# Patient Record
Sex: Female | Born: 1955 | Race: White | Hispanic: No | Smoking: Current every day smoker
Health system: Southern US, Community
[De-identification: ages and names within clinical notes are randomized; demographics above are authoritative.]

## PROBLEM LIST (undated history)

## (undated) DIAGNOSIS — C801 Malignant (primary) neoplasm, unspecified: Secondary | ICD-10-CM

## (undated) DIAGNOSIS — I509 Heart failure, unspecified: Secondary | ICD-10-CM

## (undated) DIAGNOSIS — E1149 Type 2 diabetes mellitus with other diabetic neurological complication: Secondary | ICD-10-CM

## (undated) DIAGNOSIS — M1990 Unspecified osteoarthritis, unspecified site: Secondary | ICD-10-CM

## (undated) DIAGNOSIS — I10 Essential (primary) hypertension: Secondary | ICD-10-CM

## (undated) DIAGNOSIS — G629 Polyneuropathy, unspecified: Secondary | ICD-10-CM

## (undated) DIAGNOSIS — I739 Peripheral vascular disease, unspecified: Secondary | ICD-10-CM

## (undated) DIAGNOSIS — IMO0001 Reserved for inherently not codable concepts without codable children: Secondary | ICD-10-CM

## (undated) DIAGNOSIS — J449 Chronic obstructive pulmonary disease, unspecified: Secondary | ICD-10-CM

## (undated) HISTORY — PX: KNEE ARTHROSCOPY: SUR90

## (undated) HISTORY — PX: TONSILLECTOMY: SUR1361

## (undated) HISTORY — PX: CORONARY ANGIOPLASTY: SHX604

## (undated) HISTORY — PX: BACK SURGERY: SHX140

## (undated) HISTORY — DX: Peripheral vascular disease, unspecified: I73.9

## (undated) HISTORY — PX: CARPAL TUNNEL RELEASE: SHX101

## (undated) HISTORY — DX: Essential (primary) hypertension: I10

## (undated) HISTORY — DX: Unspecified osteoarthritis, unspecified site: M19.90

## (undated) HISTORY — DX: Chronic obstructive pulmonary disease, unspecified: J44.9

## (undated) HISTORY — PX: FOOT SURGERY: SHX648

## (undated) HISTORY — PX: ABDOMINAL HYSTERECTOMY: SHX81

---

## 1998-09-17 ENCOUNTER — Encounter: Admission: RE | Admit: 1998-09-17 | Discharge: 1998-11-09 | Payer: Self-pay | Admitting: Anesthesiology

## 2001-05-10 ENCOUNTER — Encounter: Payer: Self-pay | Admitting: Neurosurgery

## 2001-05-10 ENCOUNTER — Inpatient Hospital Stay (HOSPITAL_COMMUNITY): Admission: RE | Admit: 2001-05-10 | Discharge: 2001-05-13 | Payer: Self-pay | Admitting: Neurosurgery

## 2002-07-05 ENCOUNTER — Encounter: Payer: Self-pay | Admitting: Neurosurgery

## 2002-07-05 ENCOUNTER — Ambulatory Visit (HOSPITAL_COMMUNITY): Admission: RE | Admit: 2002-07-05 | Discharge: 2002-07-05 | Payer: Self-pay | Admitting: Neurosurgery

## 2002-10-21 ENCOUNTER — Encounter: Payer: Self-pay | Admitting: Neurosurgery

## 2002-10-28 ENCOUNTER — Inpatient Hospital Stay (HOSPITAL_COMMUNITY): Admission: RE | Admit: 2002-10-28 | Discharge: 2002-11-01 | Payer: Self-pay | Admitting: Neurosurgery

## 2002-10-28 ENCOUNTER — Encounter: Payer: Self-pay | Admitting: Neurosurgery

## 2003-03-21 ENCOUNTER — Other Ambulatory Visit: Payer: Self-pay

## 2003-11-16 ENCOUNTER — Ambulatory Visit (HOSPITAL_COMMUNITY): Admission: RE | Admit: 2003-11-16 | Discharge: 2003-11-16 | Payer: Self-pay | Admitting: Neurosurgery

## 2004-01-15 ENCOUNTER — Ambulatory Visit: Payer: Self-pay | Admitting: Specialist

## 2004-01-15 ENCOUNTER — Other Ambulatory Visit: Payer: Self-pay

## 2004-01-20 ENCOUNTER — Other Ambulatory Visit: Payer: Self-pay

## 2004-01-20 ENCOUNTER — Inpatient Hospital Stay: Payer: Self-pay | Admitting: Specialist

## 2006-09-05 ENCOUNTER — Ambulatory Visit: Payer: Self-pay | Admitting: Internal Medicine

## 2006-09-07 ENCOUNTER — Ambulatory Visit: Payer: Self-pay | Admitting: Gastroenterology

## 2006-12-05 ENCOUNTER — Inpatient Hospital Stay: Payer: Self-pay | Admitting: Internal Medicine

## 2006-12-05 ENCOUNTER — Other Ambulatory Visit: Payer: Self-pay

## 2006-12-08 ENCOUNTER — Other Ambulatory Visit: Payer: Self-pay

## 2007-01-03 ENCOUNTER — Ambulatory Visit: Payer: Self-pay | Admitting: Vascular Surgery

## 2007-01-12 ENCOUNTER — Emergency Department: Payer: Self-pay | Admitting: Unknown Physician Specialty

## 2007-01-12 ENCOUNTER — Other Ambulatory Visit: Payer: Self-pay

## 2007-01-23 ENCOUNTER — Ambulatory Visit: Payer: Self-pay | Admitting: Unknown Physician Specialty

## 2007-01-25 ENCOUNTER — Ambulatory Visit: Payer: Self-pay | Admitting: Unknown Physician Specialty

## 2007-10-09 ENCOUNTER — Ambulatory Visit: Payer: Self-pay | Admitting: Vascular Surgery

## 2007-12-19 ENCOUNTER — Other Ambulatory Visit: Payer: Self-pay

## 2007-12-19 ENCOUNTER — Ambulatory Visit: Payer: Self-pay | Admitting: Internal Medicine

## 2007-12-28 ENCOUNTER — Ambulatory Visit: Payer: Self-pay | Admitting: Cardiovascular Disease

## 2008-11-12 ENCOUNTER — Ambulatory Visit: Payer: Self-pay | Admitting: Gastroenterology

## 2010-05-14 ENCOUNTER — Inpatient Hospital Stay: Payer: Self-pay | Admitting: Internal Medicine

## 2010-05-21 LAB — PATHOLOGY REPORT

## 2010-11-28 ENCOUNTER — Emergency Department: Payer: Self-pay | Admitting: *Deleted

## 2011-02-22 ENCOUNTER — Ambulatory Visit: Payer: Self-pay | Admitting: Internal Medicine

## 2011-07-05 ENCOUNTER — Observation Stay: Payer: Self-pay | Admitting: Internal Medicine

## 2011-07-05 LAB — CK TOTAL AND CKMB (NOT AT ARMC)
CK-MB: 0.8 ng/mL (ref 0.5–3.6)
CK-MB: 4.9 ng/mL — ABNORMAL HIGH (ref 0.5–3.6)

## 2011-07-05 LAB — CBC
HGB: 15.6 g/dL (ref 12.0–16.0)
MCV: 93 fL (ref 80–100)
Platelet: 282 10*3/uL (ref 150–440)

## 2011-07-05 LAB — TROPONIN I
Troponin-I: <0.02 ng/mL
Troponin-I: <0.02 ng/mL

## 2011-07-05 LAB — COMPREHENSIVE METABOLIC PANEL
Alkaline Phosphatase: 174 U/L — ABNORMAL HIGH (ref 50–136)
Anion Gap: 10 (ref 7–16)
Bilirubin,Total: 0.3 mg/dL (ref 0.2–1.0)
Chloride: 100 mmol/L (ref 98–107)
Co2: 25 mmol/L (ref 21–32)
Creatinine: 0.69 mg/dL (ref 0.60–1.30)
EGFR (African American): >60
EGFR (Non-African Amer.): >60
Glucose: 229 mg/dL — ABNORMAL HIGH (ref 65–99)
Osmolality: 280 (ref 275–301)
SGOT(AST): 79 U/L — ABNORMAL HIGH (ref 15–37)
SGPT (ALT): 63 U/L
Sodium: 135 mmol/L — ABNORMAL LOW (ref 136–145)
Total Protein: 9 g/dL — ABNORMAL HIGH (ref 6.4–8.2)

## 2011-07-06 LAB — HEPATIC FUNCTION PANEL A (ARMC)
Alkaline Phosphatase: 163 U/L — ABNORMAL HIGH (ref 50–136)
Bilirubin, Direct: <0.1 mg/dL (ref 0.00–0.20)
Bilirubin,Total: 0.3 mg/dL (ref 0.2–1.0)
SGOT(AST): 87 U/L — ABNORMAL HIGH (ref 15–37)
SGPT (ALT): 67 U/L
Total Protein: 8.2 g/dL (ref 6.4–8.2)

## 2011-07-06 LAB — BASIC METABOLIC PANEL
BUN: 23 mg/dL — ABNORMAL HIGH (ref 7–18)
Calcium, Total: 9.3 mg/dL (ref 8.5–10.1)
Chloride: 98 mmol/L (ref 98–107)
Co2: 22 mmol/L (ref 21–32)
Creatinine: 0.86 mg/dL (ref 0.60–1.30)
EGFR (Non-African Amer.): >60
Osmolality: 281 (ref 275–301)
Potassium: 4.7 mmol/L (ref 3.5–5.1)
Sodium: 132 mmol/L — ABNORMAL LOW (ref 136–145)

## 2011-07-06 LAB — CBC WITH DIFFERENTIAL/PLATELET
Basophil #: 0 10*3/uL (ref 0.0–0.1)
Basophil %: 0 %
Eosinophil #: 0 10*3/uL (ref 0.0–0.7)
HCT: 41 % (ref 35.0–47.0)
HGB: 13.9 g/dL (ref 12.0–16.0)
Lymphocyte #: 0.9 10*3/uL — ABNORMAL LOW (ref 1.0–3.6)
Lymphocyte %: 8.6 %
MCH: 31.7 pg (ref 26.0–34.0)
MCV: 93 fL (ref 80–100)
Monocyte #: 0 10*3/uL (ref 0.0–0.7)
Neutrophil #: 10 10*3/uL — ABNORMAL HIGH (ref 1.4–6.5)
Neutrophil %: 91 %
Platelet: 220 10*3/uL (ref 150–440)
RDW: 12.7 % (ref 11.5–14.5)
WBC: 11 10*3/uL (ref 3.6–11.0)

## 2011-07-06 LAB — CK TOTAL AND CKMB (NOT AT ARMC): CK-MB: 4.4 ng/mL — ABNORMAL HIGH (ref 0.5–3.6)

## 2011-07-06 LAB — LIPID PANEL
HDL Cholesterol: 32 mg/dL — ABNORMAL LOW (ref 40–60)
Triglycerides: 601 mg/dL — ABNORMAL HIGH (ref 0–200)

## 2011-07-11 LAB — PROT IMMUNOELECTROPHORES(ARMC)

## 2011-08-05 ENCOUNTER — Emergency Department: Payer: Self-pay | Admitting: Emergency Medicine

## 2011-08-05 LAB — CBC
HCT: 39 % (ref 35.0–47.0)
HGB: 12.8 g/dL (ref 12.0–16.0)
MCH: 30.9 pg (ref 26.0–34.0)
MCHC: 32.9 g/dL (ref 32.0–36.0)
MCV: 94 fL (ref 80–100)
Platelet: 288 10*3/uL (ref 150–440)
WBC: 11 10*3/uL (ref 3.6–11.0)

## 2011-08-05 LAB — URINALYSIS, COMPLETE
Bacteria: NONE SEEN
Glucose,UR: NEGATIVE mg/dL (ref 0–75)
Hyaline Cast: 1
Ketone: NEGATIVE
Nitrite: NEGATIVE
Protein: NEGATIVE
RBC,UR: <1 /HPF (ref 0–5)
Specific Gravity: 1.008 (ref 1.003–1.030)

## 2011-08-05 LAB — COMPREHENSIVE METABOLIC PANEL
Albumin: 3.9 g/dL (ref 3.4–5.0)
Alkaline Phosphatase: 170 U/L — ABNORMAL HIGH (ref 50–136)
Anion Gap: 11 (ref 7–16)
BUN: 22 mg/dL — ABNORMAL HIGH (ref 7–18)
Chloride: 102 mmol/L (ref 98–107)
Co2: 24 mmol/L (ref 21–32)
EGFR (African American): >60
Glucose: 195 mg/dL — ABNORMAL HIGH (ref 65–99)
Potassium: 4.2 mmol/L (ref 3.5–5.1)
SGPT (ALT): 46 U/L
Sodium: 137 mmol/L (ref 136–145)
Total Protein: 8.2 g/dL (ref 6.4–8.2)

## 2011-08-05 LAB — DRUG SCREEN, URINE
Amphetamines, Ur Screen: NEGATIVE (ref ?–1000)
Cannabinoid 50 Ng, Ur ~~LOC~~: POSITIVE (ref ?–50)
Cocaine Metabolite,Ur ~~LOC~~: NEGATIVE (ref ?–300)
Methadone, Ur Screen: POSITIVE (ref ?–300)
Phencyclidine (PCP) Ur S: NEGATIVE (ref ?–25)

## 2011-08-05 LAB — TROPONIN I: Troponin-I: <0.02 ng/mL

## 2011-11-02 ENCOUNTER — Ambulatory Visit: Payer: Self-pay | Admitting: Family

## 2012-01-17 ENCOUNTER — Ambulatory Visit: Payer: Self-pay | Admitting: Specialist

## 2012-01-17 LAB — URINALYSIS, COMPLETE
Bacteria: NONE SEEN
Bilirubin,UR: NEGATIVE
Glucose,UR: NEGATIVE mg/dL (ref 0–75)
Ketone: NEGATIVE
Leukocyte Esterase: NEGATIVE
RBC,UR: 4 /HPF (ref 0–5)
Specific Gravity: 1.013 (ref 1.003–1.030)
Squamous Epithelial: <1

## 2012-01-17 LAB — BASIC METABOLIC PANEL
BUN: 23 mg/dL — ABNORMAL HIGH (ref 7–18)
Chloride: 104 mmol/L (ref 98–107)
Creatinine: 0.7 mg/dL (ref 0.60–1.30)
EGFR (African American): >60
Glucose: 150 mg/dL — ABNORMAL HIGH (ref 65–99)
Potassium: 3.7 mmol/L (ref 3.5–5.1)
Sodium: 138 mmol/L (ref 136–145)

## 2012-01-17 LAB — PROTIME-INR
INR: 1
Prothrombin Time: 13.1 secs (ref 11.5–14.7)

## 2012-01-17 LAB — CBC
HCT: 41.1 % (ref 35.0–47.0)
HGB: 14.2 g/dL (ref 12.0–16.0)
MCH: 31.4 pg (ref 26.0–34.0)
MCHC: 34.4 g/dL (ref 32.0–36.0)
MCV: 91 fL (ref 80–100)
Platelet: 382 10*3/uL (ref 150–440)
WBC: 13 10*3/uL — ABNORMAL HIGH (ref 3.6–11.0)

## 2012-01-24 ENCOUNTER — Inpatient Hospital Stay: Payer: Self-pay | Admitting: Specialist

## 2012-01-25 LAB — BASIC METABOLIC PANEL
BUN: 10 mg/dL (ref 7–18)
Calcium, Total: 8.2 mg/dL — ABNORMAL LOW (ref 8.5–10.1)
Co2: 26 mmol/L (ref 21–32)
Creatinine: 0.68 mg/dL (ref 0.60–1.30)
EGFR (African American): >60
EGFR (Non-African Amer.): >60
Glucose: 175 mg/dL — ABNORMAL HIGH (ref 65–99)
Osmolality: 281 (ref 275–301)
Sodium: 139 mmol/L (ref 136–145)

## 2012-01-25 LAB — CBC WITH DIFFERENTIAL/PLATELET
Eosinophil %: 1.2 %
HCT: 29.3 % — ABNORMAL LOW (ref 35.0–47.0)
HGB: 10.1 g/dL — ABNORMAL LOW (ref 12.0–16.0)
Lymphocyte #: 2.2 10*3/uL (ref 1.0–3.6)
MCH: 32.1 pg (ref 26.0–34.0)
MCHC: 34.5 g/dL (ref 32.0–36.0)
MCV: 93 fL (ref 80–100)
Monocyte %: 5.9 %
Neutrophil #: 7.3 10*3/uL — ABNORMAL HIGH (ref 1.4–6.5)
Platelet: 231 10*3/uL (ref 150–440)
RBC: 3.15 10*6/uL — ABNORMAL LOW (ref 3.80–5.20)

## 2012-01-30 LAB — PATHOLOGY REPORT

## 2012-05-16 ENCOUNTER — Ambulatory Visit: Payer: Self-pay | Admitting: Internal Medicine

## 2012-06-18 ENCOUNTER — Encounter: Payer: Self-pay | Admitting: *Deleted

## 2012-06-26 ENCOUNTER — Ambulatory Visit: Payer: Self-pay | Admitting: General Surgery

## 2012-06-27 ENCOUNTER — Telehealth: Payer: Self-pay | Admitting: *Deleted

## 2012-06-27 NOTE — Telephone Encounter (Signed)
Left message to reschedule missed appointment from 06-26-12

## 2012-06-29 ENCOUNTER — Encounter: Payer: Self-pay | Admitting: *Deleted

## 2012-10-08 ENCOUNTER — Ambulatory Visit: Payer: Self-pay | Admitting: Gastroenterology

## 2013-01-25 ENCOUNTER — Encounter: Payer: Self-pay | Admitting: Podiatry

## 2013-01-25 ENCOUNTER — Ambulatory Visit (INDEPENDENT_AMBULATORY_CARE_PROVIDER_SITE_OTHER): Payer: Medicare Other | Admitting: Podiatry

## 2013-01-25 ENCOUNTER — Ambulatory Visit: Payer: Self-pay | Admitting: Podiatry

## 2013-01-25 ENCOUNTER — Ambulatory Visit: Payer: Self-pay

## 2013-01-25 ENCOUNTER — Ambulatory Visit (INDEPENDENT_AMBULATORY_CARE_PROVIDER_SITE_OTHER): Payer: Medicare Other

## 2013-01-25 VITALS — BP 131/70 | HR 70 | Resp 16 | Ht 68.0 in | Wt 185.0 lb

## 2013-01-25 DIAGNOSIS — L97509 Non-pressure chronic ulcer of other part of unspecified foot with unspecified severity: Secondary | ICD-10-CM

## 2013-01-25 DIAGNOSIS — R52 Pain, unspecified: Secondary | ICD-10-CM

## 2013-01-25 MED ORDER — SULFAMETHOXAZOLE-TMP DS 800-160 MG PO TABS: 1.0000 | ORAL_TABLET | Freq: Two times a day (BID) | ORAL | Status: DC

## 2013-01-25 MED ORDER — HYDROCODONE-ACETAMINOPHEN 10-325 MG PO TABS: 1.0000 | ORAL_TABLET | Freq: Three times a day (TID) | ORAL | Status: DC | PRN

## 2013-01-25 NOTE — Patient Instructions (Signed)
Soak 2x day and if any reddness, swelling or systemic signs of infection please contact us and emergency room

## 2013-01-25 NOTE — Progress Notes (Signed)
Subjective:     Patient ID: Marie [name], female   DOB: 1955/12/15, 57 y.o.   MRN: [id number]  Foot Pain   patient states that she was bit by something on her left foot 2 weeks ago and it has been giving her problems since. The area that I was working on was doing better but it is becoming thick and bothering her again. Her sugar is running very well and she has no systemic signs of infection and she was seen by Dr. Harl Bowie who placed her on antibiotics consisting of E. Mycin.    Review of Systems  All other systems reviewed and are negative.       Objective:   Physical Exam  Nursing note and vitals reviewed. Constitutional: She appears well-developed and well-nourished.  Cardiovascular: Intact distal pulses.   Musculoskeletal: Normal range of motion.  Neurological: She is alert.  Skin: Skin is warm.   the plantar aspect of the left second and third metatarsals shows significant thickening of the skin with some indications more proximal of a bite. I do not see any proximal signs of infection no increased erythema edema or calor or distended lymph nodes. The area of thickness does not have any active drainage and there is no indications of active ulceration    Assessment:     Unknown bite plantar aspect left foot which is not ulcerating currently. Severe thickness of the tissue when upon debridable does indicate slight breakdown not deep with no drainage    Plan:     X-rays reviewed with the patient in deep debridement tissue accomplished today. I am placing her back on the Septra DS for 15 days has that did well for her and I did apply Iodosorb into the small area of drainage I applied sterile dressing and instructed on soaks and dispense Darco shoe. Explained that if any  indications of systemic infection she is immediately go to the emergency room and contact us and Dr. Harl Bowie. Reappoint for me to recheck 2 week

## 2013-02-12 ENCOUNTER — Encounter: Payer: Self-pay | Admitting: Podiatry

## 2013-02-12 ENCOUNTER — Ambulatory Visit (INDEPENDENT_AMBULATORY_CARE_PROVIDER_SITE_OTHER): Payer: Medicare Other | Admitting: Podiatry

## 2013-02-12 VITALS — BP 149/85 | HR 87 | Resp 16 | Ht 68.0 in | Wt 186.0 lb

## 2013-02-12 DIAGNOSIS — L84 Corns and callosities: Secondary | ICD-10-CM

## 2013-02-12 DIAGNOSIS — L97509 Non-pressure chronic ulcer of other part of unspecified foot with unspecified severity: Secondary | ICD-10-CM

## 2013-02-12 MED ORDER — HYDROCODONE-IBUPROFEN 5-200 MG PO TABS: 1.0000 | ORAL_TABLET | Freq: Three times a day (TID) | ORAL | Status: DC | PRN

## 2013-02-12 NOTE — Progress Notes (Signed)
Subjective:     Patient ID: Marie [name], female   DOB: 1955/08/10, 57 y.o.   MRN: [id number]  HPI patient's plantar left foot shows significant keratotic tissue that she states is tender and has built up again over the last 10-14 days. Feeling well besides that and has stopped antibiotic   Review of Systems  All other systems reviewed and are negative.       Objective:   Physical Exam  Nursing note and vitals reviewed. Constitutional: She is oriented to person, place, and time.  Musculoskeletal: Normal range of motion.  Neurological: She is oriented to person, place, and time.  Skin: Skin is warm.   plantar aspect left foot shows 2 distinct areas of thick keratotic tissue formation. No proximal lymph node distention no redness or swelling of the foot note    Assessment:     Does not appear to be infection but patient is forming thick callus tissue with minimal ulceration noted    Plan:     Educated patient on being very careful with this and continuing soaks. Using sharp sterile instrumentation debridement accomplished of tissue with a small amount of breakdown around the second metatarsal with no drainage or odor. Applied Iodosorb and dressed and instructed on soaks and to let us know immediately if any redness or swelling should occur. If not read check in 4 week

## 2013-03-15 ENCOUNTER — Ambulatory Visit: Payer: Medicare Other | Admitting: Podiatry

## 2013-03-28 ENCOUNTER — Emergency Department: Payer: Self-pay | Admitting: Emergency Medicine

## 2013-03-28 ENCOUNTER — Telehealth: Payer: Self-pay | Admitting: Podiatrist

## 2013-03-28 LAB — BASIC METABOLIC PANEL
BUN: 19 mg/dL — ABNORMAL HIGH (ref 7–18)
Calcium, Total: 9.4 mg/dL (ref 8.5–10.1)
Co2: 25 mmol/L (ref 21–32)
Creatinine: 0.79 mg/dL (ref 0.60–1.30)
EGFR (African American): >60
EGFR (Non-African Amer.): >60
Glucose: 183 mg/dL — ABNORMAL HIGH (ref 65–99)
Osmolality: 279 (ref 275–301)
Potassium: 4.2 mmol/L (ref 3.5–5.1)

## 2013-03-28 LAB — CBC WITH DIFFERENTIAL/PLATELET
Eosinophil #: 0.5 10*3/uL (ref 0.0–0.7)
Eosinophil %: 4.3 %
HCT: 39.6 % (ref 35.0–47.0)
Lymphocyte %: 38 %
MCH: 31 pg (ref 26.0–34.0)
Monocyte #: 0.5 x10 3/mm (ref 0.2–0.9)
Monocyte %: 4.7 %
Platelet: 302 10*3/uL (ref 150–440)
WBC: 10.6 10*3/uL (ref 3.6–11.0)

## 2013-03-28 NOTE — Telephone Encounter (Signed)
PATIENT CALLED STATED SHE HAD A SPOT ON HER FOOT THAT WAS OOZING, SWOLLEN, RED, BAD ODOR AND A RED STREAK. IT HAS BEEN THAT WAY FOR 1 WEEK DUE TO SHOPPING FOR 2 DAYS. NOTIFIED DR. EGERTON AND SHE STATED THE PATIENT NEEDS TO GO TO THE E.R. FOR TREATMENT.

## 2013-04-02 LAB — CULTURE, BLOOD (SINGLE)

## 2013-04-19 ENCOUNTER — Ambulatory Visit (INDEPENDENT_AMBULATORY_CARE_PROVIDER_SITE_OTHER): Payer: Medicare Other | Admitting: Podiatry

## 2013-04-19 ENCOUNTER — Encounter: Payer: Self-pay | Admitting: Podiatry

## 2013-04-19 ENCOUNTER — Telehealth: Payer: Self-pay | Admitting: *Deleted

## 2013-04-19 ENCOUNTER — Ambulatory Visit (INDEPENDENT_AMBULATORY_CARE_PROVIDER_SITE_OTHER): Payer: Medicare Other

## 2013-04-19 VITALS — BP 141/91 | HR 78 | Resp 16 | Ht 68.0 in | Wt 180.0 lb

## 2013-04-19 DIAGNOSIS — M79609 Pain in unspecified limb: Secondary | ICD-10-CM

## 2013-04-19 DIAGNOSIS — E1149 Type 2 diabetes mellitus with other diabetic neurological complication: Secondary | ICD-10-CM

## 2013-04-19 DIAGNOSIS — M7750 Other enthesopathy of unspecified foot: Secondary | ICD-10-CM

## 2013-04-19 DIAGNOSIS — L97509 Non-pressure chronic ulcer of other part of unspecified foot with unspecified severity: Secondary | ICD-10-CM

## 2013-04-19 DIAGNOSIS — M79672 Pain in left foot: Secondary | ICD-10-CM

## 2013-04-19 MED ORDER — HYDROCODONE-IBUPROFEN 5-200 MG PO TABS: 1.0000 | ORAL_TABLET | Freq: Three times a day (TID) | ORAL | Status: DC | PRN

## 2013-04-19 MED ORDER — SULFAMETHOXAZOLE-TMP DS 800-160 MG PO TABS: 1.0000 | ORAL_TABLET | Freq: Two times a day (BID) | ORAL | Status: DC

## 2013-04-19 MED ORDER — CEPHALEXIN 500 MG PO CAPS: 500.0000 mg | ORAL_CAPSULE | Freq: Three times a day (TID) | ORAL | Status: DC

## 2013-04-19 NOTE — Telephone Encounter (Signed)
Marie [name] FROM [facility] CALLED STATED THAT Marie [name] HAD ANAPHYLAXIS REACTION TO PENICILLINS AND Marie [name] PRESCRIBED KEFLEX , SHE WOULD RATHER HAVE THE BACTRIM DS , SPOKE WITH Marie [name] AND Marie [name] OK THE BACTRIM.

## 2013-04-19 NOTE — Progress Notes (Signed)
Subjective:     Patient ID: Marie [name], female   DOB: Aug 24, 1955, 58 y.o.   MRN: [id number]  HPI patient states over the last couple weeks she started to have problems with the bottom of her left foot again and she went to the emergency room where they gave her IV antibiotics and she is doing better now. States the lesion has become very thick and also her sugar has been under good control   Review of Systems     Objective:   Physical Exam Neurovascular status is intact with a large keratotic lesion sub-second metatarsal and small lesion third metatarsal with no proximal edema erythema  or calor    Assessment:     Doing well with this but does have thick tissue which forms which I do believe can get infected with time. I am still concerned about bone but hopefully there is no bone infection    Plan:     Reviewed condition and did deep debridement of lesions with a superficial ulceration under the second metatarsal with no drainage or odor. It is localized in today I trimmed it fully and applied Iodosorb and placed patient back on Bactrim DS because of mild redness around the area. Reappoint 6 weeks for debridement of tissue and also we are going to get her certified for diabetic shoes which I think will be beneficial

## 2013-05-10 ENCOUNTER — Ambulatory Visit: Payer: Self-pay | Admitting: Podiatry

## 2013-05-17 ENCOUNTER — Ambulatory Visit (INDEPENDENT_AMBULATORY_CARE_PROVIDER_SITE_OTHER): Payer: Medicare Other | Admitting: Podiatry

## 2013-05-17 ENCOUNTER — Encounter: Payer: Self-pay | Admitting: Podiatry

## 2013-05-17 VITALS — BP 134/86 | HR 76 | Resp 16 | Ht 68.0 in | Wt 186.0 lb

## 2013-05-17 DIAGNOSIS — E1159 Type 2 diabetes mellitus with other circulatory complications: Secondary | ICD-10-CM

## 2013-05-17 DIAGNOSIS — M2040 Other hammer toe(s) (acquired), unspecified foot: Secondary | ICD-10-CM

## 2013-05-17 DIAGNOSIS — L97509 Non-pressure chronic ulcer of other part of unspecified foot with unspecified severity: Secondary | ICD-10-CM

## 2013-05-17 MED ORDER — SULFAMETHOXAZOLE-TMP DS 800-160 MG PO TABS: 1.0000 | ORAL_TABLET | Freq: Two times a day (BID) | ORAL | Status: DC

## 2013-05-17 NOTE — Progress Notes (Signed)
Subjective:     Patient ID: KEAJAH KILLOUGH, female   DOB: 07/07/1955, 58 y.o.   MRN: [id number]  HPI patient presents for followup of discomfort in the plantar aspect of the left forefoot. She presents for diabetic shoe measurements and a look at the area underneath the foot   Review of Systems     Objective:   Physical Exam Neurovascular status unchanged with large keratotic lesion subsecond and third metatarsal left with no current drainage mild edema erythema and no proximal lymph node distention noted. No systemic signs of infection no fever or change in sugar levels    Assessment:     Large keratotic lesion plantar second and third metatarsal which builds over time with possibility of low grade localized infection    Plan:     H&P and condition discussed. Patient is measure for diabetic shoes and deep debridement of tissue done today with no drainage noted mild odor. Place back on Bactrim DS and instructed if any redness swelling or drainage should occur to go straight to the emergency room and continues soaks and padding of the area. Reappoint her recheck again when the shoes are ready or if any issues should occur

## 2013-06-11 ENCOUNTER — Encounter: Payer: Self-pay | Admitting: *Deleted

## 2013-06-11 NOTE — Progress Notes (Signed)
Sent pt postcard letting her know diabetic shoes are here.

## 2013-06-21 ENCOUNTER — Ambulatory Visit (INDEPENDENT_AMBULATORY_CARE_PROVIDER_SITE_OTHER): Payer: Medicare Other | Admitting: Podiatry

## 2013-06-21 VITALS — BP 123/80 | HR 73 | Resp 16 | Ht 65.0 in | Wt 190.0 lb

## 2013-06-21 DIAGNOSIS — M2040 Other hammer toe(s) (acquired), unspecified foot: Secondary | ICD-10-CM

## 2013-06-21 DIAGNOSIS — E1159 Type 2 diabetes mellitus with other circulatory complications: Secondary | ICD-10-CM

## 2013-06-21 DIAGNOSIS — L97509 Non-pressure chronic ulcer of other part of unspecified foot with unspecified severity: Secondary | ICD-10-CM

## 2013-06-21 DIAGNOSIS — Q828 Other specified congenital malformations of skin: Secondary | ICD-10-CM

## 2013-06-21 MED ORDER — HYDROCODONE-IBUPROFEN 5-200 MG PO TABS: 1.0000 | ORAL_TABLET | Freq: Three times a day (TID) | ORAL | Status: DC | PRN

## 2013-06-21 NOTE — Progress Notes (Signed)
Subjective:     Patient ID: Marie [name], female   DOB: December 09, 1955, 58 y.o.   MRN: [id number]  HPI patient states the callus is really forming again and she is excited to get her diabetic shoes   Review of Systems     Objective:   Physical Exam No change in neurovascular status with no erythema noted in the foot no odor or breakdown currently occurring. 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 Reappoint when lesion reoccur

## 2013-09-30 ENCOUNTER — Emergency Department: Payer: Self-pay | Admitting: Emergency Medicine

## 2013-09-30 LAB — BASIC METABOLIC PANEL
Anion Gap: 6 — ABNORMAL LOW (ref 7–16)
BUN: 21 mg/dL — AB (ref 7–18)
CREATININE: 0.99 mg/dL (ref 0.60–1.30)
Calcium, Total: 9.7 mg/dL (ref 8.5–10.1)
Chloride: 101 mmol/L (ref 98–107)
Co2: 29 mmol/L (ref 21–32)
Glucose: 127 mg/dL — ABNORMAL HIGH (ref 65–99)
Osmolality: 277 (ref 275–301)
POTASSIUM: 4.2 mmol/L (ref 3.5–5.1)
SODIUM: 136 mmol/L (ref 136–145)

## 2013-09-30 LAB — CBC WITH DIFFERENTIAL/PLATELET
BASOS PCT: 0.8 %
Basophil #: 0.1 10*3/uL (ref 0.0–0.1)
EOS ABS: 0.3 10*3/uL (ref 0.0–0.7)
Eosinophil %: 2.8 %
HCT: 38.9 % (ref 35.0–47.0)
HGB: 12.9 g/dL (ref 12.0–16.0)
LYMPHS ABS: 3.4 10*3/uL (ref 1.0–3.6)
LYMPHS PCT: 36.1 %
MCH: 31 pg (ref 26.0–34.0)
MCHC: 33.1 g/dL (ref 32.0–36.0)
MCV: 93 fL (ref 80–100)
MONOS PCT: 4.8 %
Monocyte #: 0.4 x10 3/mm (ref 0.2–0.9)
NEUTROS PCT: 55.5 %
Neutrophil #: 5.1 10*3/uL (ref 1.4–6.5)
Platelet: 289 10*3/uL (ref 150–440)
RBC: 4.16 10*6/uL (ref 3.80–5.20)
RDW: 14 % (ref 11.5–14.5)
WBC: 9.3 10*3/uL (ref 3.6–11.0)

## 2013-10-01 ENCOUNTER — Encounter: Payer: Self-pay | Admitting: Podiatry

## 2013-10-01 ENCOUNTER — Ambulatory Visit (INDEPENDENT_AMBULATORY_CARE_PROVIDER_SITE_OTHER): Payer: Medicare Other | Admitting: Podiatry

## 2013-10-01 ENCOUNTER — Ambulatory Visit (INDEPENDENT_AMBULATORY_CARE_PROVIDER_SITE_OTHER): Payer: Medicare Other

## 2013-10-01 DIAGNOSIS — L97509 Non-pressure chronic ulcer of other part of unspecified foot with unspecified severity: Secondary | ICD-10-CM

## 2013-10-01 DIAGNOSIS — M2040 Other hammer toe(s) (acquired), unspecified foot: Secondary | ICD-10-CM

## 2013-10-01 DIAGNOSIS — E1149 Type 2 diabetes mellitus with other diabetic neurological complication: Secondary | ICD-10-CM

## 2013-10-01 NOTE — Progress Notes (Signed)
Subjective:     Patient ID: Marie [name], female   DOB: Dec 04, 1955, 58 y.o.   MRN: [id number]  HPI patient states that the calluses really gotten bad in the left foot and just not been able to afford become and knowing that it needed to cut the last month. She needs to be on antibiotic and states her last A1c was 6.2  Review of Systems     Objective:   Physical Exam Neurovascular status unchanged with severe callus formation second and third metatarsal left with digits that are continuing to elevate further on the second and third toes    Assessment:     Lesion secondary to structure of foot that become very severe    Plan:     Discussed at one point we may need to consider forefoot reconstruction with a Malachi Bonds type procedure but I want to wait until after summer and today I did deep debridement of lesions with no drainage and advised on padding and soaks and reappoint if any redness or drainage should occur

## 2013-11-09 ENCOUNTER — Inpatient Hospital Stay: Payer: Self-pay | Admitting: Internal Medicine

## 2013-11-09 LAB — BASIC METABOLIC PANEL
Anion Gap: 10 (ref 7–16)
BUN: 16 mg/dL (ref 7–18)
CO2: 22 mmol/L (ref 21–32)
CREATININE: 1.13 mg/dL (ref 0.60–1.30)
Calcium, Total: 8.5 mg/dL (ref 8.5–10.1)
Chloride: 100 mmol/L (ref 98–107)
GFR CALC NON AF AMER: 54 — AB
GLUCOSE: 235 mg/dL — AB (ref 65–99)
Osmolality: 273 (ref 275–301)
Potassium: 3 mmol/L — ABNORMAL LOW (ref 3.5–5.1)
Sodium: 132 mmol/L — ABNORMAL LOW (ref 136–145)

## 2013-11-09 LAB — URINALYSIS, COMPLETE
Bilirubin,UR: NEGATIVE
Nitrite: NEGATIVE
Ph: 5 (ref 4.5–8.0)
Protein: 30
RBC,UR: 4 /HPF (ref 0–5)
SPECIFIC GRAVITY: 1.017 (ref 1.003–1.030)
Squamous Epithelial: 1

## 2013-11-09 LAB — TROPONIN I: Troponin-I: <0.02 ng/mL

## 2013-11-09 LAB — CBC
HCT: 36 % (ref 35.0–47.0)
HGB: 12 g/dL (ref 12.0–16.0)
MCH: 30.9 pg (ref 26.0–34.0)
MCHC: 33.3 g/dL (ref 32.0–36.0)
MCV: 93 fL (ref 80–100)
PLATELETS: 196 10*3/uL (ref 150–440)
RBC: 3.88 10*6/uL (ref 3.80–5.20)
RDW: 13.2 % (ref 11.5–14.5)
WBC: 9.8 10*3/uL (ref 3.6–11.0)

## 2013-11-09 LAB — HEPATIC FUNCTION PANEL A (ARMC)
ALBUMIN: 3 g/dL — AB (ref 3.4–5.0)
AST: 73 U/L — AB (ref 15–37)
Alkaline Phosphatase: 153 U/L — ABNORMAL HIGH
Bilirubin,Total: 0.4 mg/dL (ref 0.2–1.0)
SGPT (ALT): 55 U/L
TOTAL PROTEIN: 8.1 g/dL (ref 6.4–8.2)

## 2013-11-10 LAB — CBC WITH DIFFERENTIAL/PLATELET
BASOS ABS: 0 10*3/uL (ref 0.0–0.1)
Basophil %: 0.5 %
Eosinophil #: 0.1 10*3/uL (ref 0.0–0.7)
Eosinophil %: 1.4 %
HCT: 27.8 % — ABNORMAL LOW (ref 35.0–47.0)
HGB: 9.5 g/dL — AB (ref 12.0–16.0)
LYMPHS PCT: 21.9 %
Lymphocyte #: 1.4 10*3/uL (ref 1.0–3.6)
MCH: 32.1 pg (ref 26.0–34.0)
MCHC: 34.3 g/dL (ref 32.0–36.0)
MCV: 94 fL (ref 80–100)
Monocyte #: 0.4 x10 3/mm (ref 0.2–0.9)
Monocyte %: 6.8 %
NEUTROS ABS: 4.4 10*3/uL (ref 1.4–6.5)
NEUTROS PCT: 69.4 %
Platelet: 149 10*3/uL — ABNORMAL LOW (ref 150–440)
RBC: 2.97 10*6/uL — AB (ref 3.80–5.20)
RDW: 13.6 % (ref 11.5–14.5)
WBC: 6.3 10*3/uL (ref 3.6–11.0)

## 2013-11-10 LAB — BASIC METABOLIC PANEL
ANION GAP: 7 (ref 7–16)
BUN: 13 mg/dL (ref 7–18)
Calcium, Total: 7.5 mg/dL — ABNORMAL LOW (ref 8.5–10.1)
Chloride: 110 mmol/L — ABNORMAL HIGH (ref 98–107)
Co2: 23 mmol/L (ref 21–32)
Creatinine: 0.92 mg/dL (ref 0.60–1.30)
EGFR (Non-African Amer.): >60
GLUCOSE: 226 mg/dL — AB (ref 65–99)
OSMOLALITY: 287 (ref 275–301)
Potassium: 3.8 mmol/L (ref 3.5–5.1)
SODIUM: 140 mmol/L (ref 136–145)

## 2013-11-11 LAB — CBC WITH DIFFERENTIAL/PLATELET
BASOS ABS: 0 10*3/uL (ref 0.0–0.1)
Basophil %: 0.5 %
EOS ABS: 0.2 10*3/uL (ref 0.0–0.7)
Eosinophil %: 2.3 %
HCT: 28.8 % — AB (ref 35.0–47.0)
HGB: 9.8 g/dL — AB (ref 12.0–16.0)
LYMPHS ABS: 1.8 10*3/uL (ref 1.0–3.6)
Lymphocyte %: 20.1 %
MCH: 31.7 pg (ref 26.0–34.0)
MCHC: 34.1 g/dL (ref 32.0–36.0)
MCV: 93 fL (ref 80–100)
MONO ABS: 0.5 x10 3/mm (ref 0.2–0.9)
Monocyte %: 5.3 %
Neutrophil #: 6.3 10*3/uL (ref 1.4–6.5)
Neutrophil %: 71.8 %
Platelet: 184 10*3/uL (ref 150–440)
RBC: 3.09 10*6/uL — ABNORMAL LOW (ref 3.80–5.20)
RDW: 13.8 % (ref 11.5–14.5)
WBC: 8.8 10*3/uL (ref 3.6–11.0)

## 2013-11-12 LAB — URINE CULTURE

## 2013-11-13 LAB — VANCOMYCIN, TROUGH
VANCOMYCIN, TROUGH: 14 ug/mL (ref 10–20)
Vancomycin, Trough: 6 ug/mL — ABNORMAL LOW (ref 10–20)

## 2013-11-13 LAB — OCCULT BLOOD X 1 CARD TO LAB, STOOL: Occult Blood, Feces: POSITIVE

## 2013-11-13 LAB — CREATININE, SERUM: Creatinine: 0.67 mg/dL (ref 0.60–1.30)

## 2013-11-14 LAB — CULTURE, BLOOD (SINGLE)

## 2013-11-19 LAB — WOUND CULTURE

## 2013-12-03 ENCOUNTER — Ambulatory Visit: Payer: Medicare Other | Admitting: Podiatry

## 2014-01-13 ENCOUNTER — Inpatient Hospital Stay: Payer: Self-pay | Admitting: Internal Medicine

## 2014-01-13 LAB — COMPREHENSIVE METABOLIC PANEL
ALBUMIN: 3.1 g/dL — AB (ref 3.4–5.0)
AST: 14 U/L — AB (ref 15–37)
Alkaline Phosphatase: 132 U/L — ABNORMAL HIGH
Anion Gap: 7 (ref 7–16)
BUN: 16 mg/dL (ref 7–18)
Bilirubin,Total: 0.3 mg/dL (ref 0.2–1.0)
Calcium, Total: 9.2 mg/dL (ref 8.5–10.1)
Chloride: 102 mmol/L (ref 98–107)
Co2: 28 mmol/L (ref 21–32)
Creatinine: 0.88 mg/dL (ref 0.60–1.30)
EGFR (Non-African Amer.): >60
Glucose: 224 mg/dL — ABNORMAL HIGH (ref 65–99)
Osmolality: 282 (ref 275–301)
POTASSIUM: 3.8 mmol/L (ref 3.5–5.1)
SGPT (ALT): 16 U/L
Sodium: 137 mmol/L (ref 136–145)
TOTAL PROTEIN: 7.9 g/dL (ref 6.4–8.2)

## 2014-01-13 LAB — CBC WITH DIFFERENTIAL/PLATELET
BASOS PCT: 0.5 %
Basophil #: 0.1 10*3/uL (ref 0.0–0.1)
Eosinophil #: 0.2 10*3/uL (ref 0.0–0.7)
Eosinophil %: 1.7 %
HCT: 34.4 % — AB (ref 35.0–47.0)
HGB: 11.1 g/dL — ABNORMAL LOW (ref 12.0–16.0)
Lymphocyte #: 3.2 10*3/uL (ref 1.0–3.6)
Lymphocyte %: 26.6 %
MCH: 30 pg (ref 26.0–34.0)
MCHC: 32.2 g/dL (ref 32.0–36.0)
MCV: 93 fL (ref 80–100)
MONOS PCT: 4.2 %
Monocyte #: 0.5 x10 3/mm (ref 0.2–0.9)
Neutrophil #: 8.1 10*3/uL — ABNORMAL HIGH (ref 1.4–6.5)
Neutrophil %: 67 %
PLATELETS: 365 10*3/uL (ref 150–440)
RBC: 3.69 10*6/uL — ABNORMAL LOW (ref 3.80–5.20)
RDW: 13.6 % (ref 11.5–14.5)
WBC: 12 10*3/uL — ABNORMAL HIGH (ref 3.6–11.0)

## 2014-01-13 LAB — DRUG SCREEN, URINE
Amphetamines, Ur Screen: NEGATIVE (ref ?–1000)
Barbiturates, Ur Screen: NEGATIVE (ref ?–200)
Benzodiazepine, Ur Scrn: NEGATIVE (ref ?–200)
CANNABINOID 50 NG, UR ~~LOC~~: POSITIVE (ref ?–50)
Cocaine Metabolite,Ur ~~LOC~~: NEGATIVE (ref ?–300)
MDMA (Ecstasy)Ur Screen: NEGATIVE (ref ?–500)
Methadone, Ur Screen: NEGATIVE (ref ?–300)
Opiate, Ur Screen: NEGATIVE (ref ?–300)
Phencyclidine (PCP) Ur S: NEGATIVE (ref ?–25)
Tricyclic, Ur Screen: NEGATIVE (ref ?–1000)

## 2014-01-14 LAB — BASIC METABOLIC PANEL
Anion Gap: 3 — ABNORMAL LOW (ref 7–16)
BUN: 15 mg/dL (ref 7–18)
Calcium, Total: 8.5 mg/dL (ref 8.5–10.1)
Chloride: 102 mmol/L (ref 98–107)
Co2: 28 mmol/L (ref 21–32)
Creatinine: 0.86 mg/dL (ref 0.60–1.30)
EGFR (African American): >60
EGFR (Non-African Amer.): >60
Glucose: 166 mg/dL — ABNORMAL HIGH (ref 65–99)
Osmolality: 271 (ref 275–301)
Potassium: 4.1 mmol/L (ref 3.5–5.1)
Sodium: 133 mmol/L — ABNORMAL LOW (ref 136–145)

## 2014-01-14 LAB — CBC WITH DIFFERENTIAL/PLATELET
BASOS PCT: 0.8 %
Basophil #: 0.1 10*3/uL (ref 0.0–0.1)
Eosinophil #: 0.3 10*3/uL (ref 0.0–0.7)
Eosinophil %: 3.9 %
HCT: 32.9 % — AB (ref 35.0–47.0)
HGB: 10.6 g/dL — AB (ref 12.0–16.0)
LYMPHS ABS: 3.2 10*3/uL (ref 1.0–3.6)
LYMPHS PCT: 40.1 %
MCH: 30.3 pg (ref 26.0–34.0)
MCHC: 32.2 g/dL (ref 32.0–36.0)
MCV: 94 fL (ref 80–100)
MONO ABS: 0.5 x10 3/mm (ref 0.2–0.9)
MONOS PCT: 6.2 %
NEUTROS ABS: 4 10*3/uL (ref 1.4–6.5)
Neutrophil %: 49 %
PLATELETS: 339 10*3/uL (ref 150–440)
RBC: 3.5 10*6/uL — ABNORMAL LOW (ref 3.80–5.20)
RDW: 14.2 % (ref 11.5–14.5)
WBC: 8.1 10*3/uL (ref 3.6–11.0)

## 2014-01-14 LAB — HEMOGLOBIN A1C: Hemoglobin A1C: 8.7 % — ABNORMAL HIGH (ref 4.2–6.3)

## 2014-01-15 LAB — VANCOMYCIN, TROUGH: Vancomycin, Trough: 9 ug/mL — ABNORMAL LOW (ref 10–20)

## 2014-01-16 LAB — CREATININE, SERUM
Creatinine: 0.74 mg/dL (ref 0.60–1.30)
EGFR (African American): >60
EGFR (Non-African Amer.): >60

## 2014-01-16 LAB — VANCOMYCIN, TROUGH: Vancomycin, Trough: 17 ug/mL (ref 10–20)

## 2014-01-17 LAB — PATHOLOGY REPORT

## 2014-01-17 LAB — VANCOMYCIN, TROUGH: VANCOMYCIN, TROUGH: 22 ug/mL — AB (ref 10–20)

## 2014-01-18 LAB — PLATELET COUNT: Platelet: 307 10*3/uL (ref 150–440)

## 2014-01-18 LAB — CULTURE, BLOOD (SINGLE)

## 2014-01-22 LAB — COMPREHENSIVE METABOLIC PANEL
ALT: 46 U/L
AST: 70 U/L — AB (ref 15–37)
Albumin: 3.5 g/dL (ref 3.4–5.0)
Alkaline Phosphatase: 185 U/L — ABNORMAL HIGH
Anion Gap: 12 (ref 7–16)
BILIRUBIN TOTAL: 0.3 mg/dL (ref 0.2–1.0)
BUN: 13 mg/dL (ref 7–18)
CREATININE: 0.83 mg/dL (ref 0.60–1.30)
Calcium, Total: 9.1 mg/dL (ref 8.5–10.1)
Chloride: 104 mmol/L (ref 98–107)
Co2: 22 mmol/L (ref 21–32)
EGFR (African American): >60
EGFR (Non-African Amer.): >60
GLUCOSE: 142 mg/dL — AB (ref 65–99)
OSMOLALITY: 278 (ref 275–301)
Potassium: 4 mmol/L (ref 3.5–5.1)
Sodium: 138 mmol/L (ref 136–145)
TOTAL PROTEIN: 8.7 g/dL — AB (ref 6.4–8.2)

## 2014-01-22 LAB — MAGNESIUM: MAGNESIUM: 1.5 mg/dL — AB

## 2014-01-23 ENCOUNTER — Inpatient Hospital Stay: Payer: Self-pay | Admitting: Internal Medicine

## 2014-01-23 LAB — URINALYSIS, COMPLETE
BACTERIA: NONE SEEN
Bilirubin,UR: NEGATIVE
Blood: NEGATIVE
Glucose,UR: NEGATIVE mg/dL (ref 0–75)
KETONE: NEGATIVE
LEUKOCYTE ESTERASE: NEGATIVE
Nitrite: NEGATIVE
PROTEIN: NEGATIVE
Ph: 5 (ref 4.5–8.0)
Specific Gravity: 1.015 (ref 1.003–1.030)
Squamous Epithelial: 9

## 2014-01-23 LAB — CBC
HCT: 38.7 % (ref 35.0–47.0)
HGB: 12.2 g/dL (ref 12.0–16.0)
MCH: 30.1 pg (ref 26.0–34.0)
MCHC: 31.4 g/dL — AB (ref 32.0–36.0)
MCV: 96 fL (ref 80–100)
PLATELETS: 409 10*3/uL (ref 150–440)
RBC: 4.05 10*6/uL (ref 3.80–5.20)
RDW: 14.2 % (ref 11.5–14.5)
WBC: 7.9 10*3/uL (ref 3.6–11.0)

## 2014-01-23 LAB — VANCOMYCIN, TROUGH: Vancomycin, Trough: 6 ug/mL — ABNORMAL LOW (ref 10–20)

## 2014-01-23 LAB — CK: CK, TOTAL: 48 U/L

## 2014-01-24 LAB — BASIC METABOLIC PANEL
ANION GAP: 6 — AB (ref 7–16)
BUN: 7 mg/dL (ref 7–18)
CHLORIDE: 111 mmol/L — AB (ref 98–107)
CREATININE: 0.73 mg/dL (ref 0.60–1.30)
Calcium, Total: 7.4 mg/dL — ABNORMAL LOW (ref 8.5–10.1)
Co2: 23 mmol/L (ref 21–32)
EGFR (African American): >60
Glucose: 147 mg/dL — ABNORMAL HIGH (ref 65–99)
OSMOLALITY: 280 (ref 275–301)
POTASSIUM: 3.9 mmol/L (ref 3.5–5.1)
Sodium: 140 mmol/L (ref 136–145)

## 2014-01-24 LAB — CBC WITH DIFFERENTIAL/PLATELET
BASOS ABS: 0 10*3/uL (ref 0.0–0.1)
Basophil %: 0.8 %
EOS PCT: 3.1 %
Eosinophil #: 0.2 10*3/uL (ref 0.0–0.7)
HCT: 26 % — AB (ref 35.0–47.0)
HGB: 8.5 g/dL — ABNORMAL LOW (ref 12.0–16.0)
LYMPHS ABS: 1.5 10*3/uL (ref 1.0–3.6)
LYMPHS PCT: 30 %
MCH: 30.4 pg (ref 26.0–34.0)
MCHC: 32.7 g/dL (ref 32.0–36.0)
MCV: 93 fL (ref 80–100)
Monocyte #: 0.3 x10 3/mm (ref 0.2–0.9)
Monocyte %: 6.3 %
Neutrophil #: 3 10*3/uL (ref 1.4–6.5)
Neutrophil %: 59.8 %
Platelet: 206 10*3/uL (ref 150–440)
RBC: 2.79 10*6/uL — ABNORMAL LOW (ref 3.80–5.20)
RDW: 14.5 % (ref 11.5–14.5)
WBC: 5 10*3/uL (ref 3.6–11.0)

## 2014-01-24 LAB — VANCOMYCIN, TROUGH: Vancomycin, Trough: 10 ug/mL (ref 10–20)

## 2014-01-26 LAB — CREATININE, SERUM
Creatinine: 0.7 mg/dL (ref 0.60–1.30)
EGFR (African American): >60

## 2014-01-26 LAB — VANCOMYCIN, TROUGH
VANCOMYCIN, TROUGH: 26 ug/mL — AB (ref 10–20)
VANCOMYCIN, TROUGH: 15 ug/mL (ref 10–20)

## 2014-01-27 LAB — CREATININE, SERUM
CREATININE: 0.72 mg/dL (ref 0.60–1.30)
EGFR (African American): >60
EGFR (Non-African Amer.): >60

## 2014-01-28 LAB — CULTURE, BLOOD (SINGLE)

## 2014-01-29 LAB — WOUND CULTURE

## 2014-04-29 ENCOUNTER — Emergency Department: Payer: Self-pay | Admitting: Emergency Medicine

## 2014-04-29 DIAGNOSIS — E1165 Type 2 diabetes mellitus with hyperglycemia: Secondary | ICD-10-CM | POA: Diagnosis not present

## 2014-04-29 DIAGNOSIS — I2510 Atherosclerotic heart disease of native coronary artery without angina pectoris: Secondary | ICD-10-CM | POA: Diagnosis not present

## 2014-04-29 DIAGNOSIS — I739 Peripheral vascular disease, unspecified: Secondary | ICD-10-CM | POA: Diagnosis not present

## 2014-04-29 DIAGNOSIS — Z720 Tobacco use: Secondary | ICD-10-CM | POA: Diagnosis not present

## 2014-04-29 DIAGNOSIS — R0602 Shortness of breath: Secondary | ICD-10-CM | POA: Diagnosis not present

## 2014-04-29 DIAGNOSIS — J40 Bronchitis, not specified as acute or chronic: Secondary | ICD-10-CM | POA: Diagnosis not present

## 2014-04-29 DIAGNOSIS — Z7982 Long term (current) use of aspirin: Secondary | ICD-10-CM | POA: Diagnosis not present

## 2014-04-29 DIAGNOSIS — R079 Chest pain, unspecified: Secondary | ICD-10-CM | POA: Diagnosis not present

## 2014-04-29 DIAGNOSIS — R0789 Other chest pain: Secondary | ICD-10-CM | POA: Diagnosis not present

## 2014-04-29 DIAGNOSIS — E1169 Type 2 diabetes mellitus with other specified complication: Secondary | ICD-10-CM | POA: Diagnosis not present

## 2014-04-29 DIAGNOSIS — R911 Solitary pulmonary nodule: Secondary | ICD-10-CM | POA: Diagnosis not present

## 2014-04-29 DIAGNOSIS — Z79899 Other long term (current) drug therapy: Secondary | ICD-10-CM | POA: Diagnosis not present

## 2014-04-29 DIAGNOSIS — L089 Local infection of the skin and subcutaneous tissue, unspecified: Secondary | ICD-10-CM | POA: Diagnosis not present

## 2014-04-29 LAB — COMPREHENSIVE METABOLIC PANEL
ALBUMIN: 3.4 g/dL (ref 3.4–5.0)
AST: 135 U/L — AB (ref 15–37)
Alkaline Phosphatase: 149 U/L — ABNORMAL HIGH
Anion Gap: 10 (ref 7–16)
BUN: 14 mg/dL (ref 7–18)
Bilirubin,Total: 0.2 mg/dL (ref 0.2–1.0)
CALCIUM: 9 mg/dL (ref 8.5–10.1)
Chloride: 105 mmol/L (ref 98–107)
Co2: 21 mmol/L (ref 21–32)
Creatinine: 0.74 mg/dL (ref 0.60–1.30)
EGFR (Non-African Amer.): >60
GLUCOSE: 305 mg/dL — AB (ref 65–99)
Osmolality: 284 (ref 275–301)
Potassium: 3.9 mmol/L (ref 3.5–5.1)
SGPT (ALT): 68 U/L — ABNORMAL HIGH
Sodium: 136 mmol/L (ref 136–145)
Total Protein: 7.7 g/dL (ref 6.4–8.2)

## 2014-04-29 LAB — CBC
HCT: 38.6 % (ref 35.0–47.0)
HGB: 12.9 g/dL (ref 12.0–16.0)
MCH: 30.8 pg (ref 26.0–34.0)
MCHC: 33.4 g/dL (ref 32.0–36.0)
MCV: 92 fL (ref 80–100)
PLATELETS: 305 10*3/uL (ref 150–440)
RBC: 4.2 10*6/uL (ref 3.80–5.20)
RDW: 14.9 % — ABNORMAL HIGH (ref 11.5–14.5)
WBC: 8.8 10*3/uL (ref 3.6–11.0)

## 2014-04-29 LAB — TROPONIN I: Troponin-I: <0.02 ng/mL

## 2014-04-29 LAB — APTT: ACTIVATED PTT: 32 s (ref 23.6–35.9)

## 2014-04-29 LAB — PROTIME-INR
INR: 0.9
PROTHROMBIN TIME: 12.2 s (ref 11.5–14.7)

## 2014-04-29 LAB — CK TOTAL AND CKMB (NOT AT ARMC)
CK, Total: 63 U/L (ref 26–192)
CK-MB: 1.2 ng/mL (ref 0.5–3.6)

## 2014-04-29 LAB — PRO B NATRIURETIC PEPTIDE: B-Type Natriuretic Peptide: 151 pg/mL — ABNORMAL HIGH (ref 0–125)

## 2014-05-15 DIAGNOSIS — E8881 Metabolic syndrome: Secondary | ICD-10-CM | POA: Diagnosis not present

## 2014-05-15 DIAGNOSIS — R911 Solitary pulmonary nodule: Secondary | ICD-10-CM | POA: Diagnosis not present

## 2014-05-15 DIAGNOSIS — I209 Angina pectoris, unspecified: Secondary | ICD-10-CM | POA: Diagnosis not present

## 2014-05-15 DIAGNOSIS — E119 Type 2 diabetes mellitus without complications: Secondary | ICD-10-CM | POA: Diagnosis not present

## 2014-06-02 DIAGNOSIS — R911 Solitary pulmonary nodule: Secondary | ICD-10-CM | POA: Diagnosis not present

## 2014-06-02 DIAGNOSIS — R0602 Shortness of breath: Secondary | ICD-10-CM | POA: Diagnosis not present

## 2014-06-02 DIAGNOSIS — F17210 Nicotine dependence, cigarettes, uncomplicated: Secondary | ICD-10-CM | POA: Diagnosis not present

## 2014-06-02 DIAGNOSIS — J449 Chronic obstructive pulmonary disease, unspecified: Secondary | ICD-10-CM | POA: Diagnosis not present

## 2014-06-12 DIAGNOSIS — E1029 Type 1 diabetes mellitus with other diabetic kidney complication: Secondary | ICD-10-CM | POA: Diagnosis not present

## 2014-06-12 DIAGNOSIS — I10 Essential (primary) hypertension: Secondary | ICD-10-CM | POA: Diagnosis not present

## 2014-06-12 DIAGNOSIS — E119 Type 2 diabetes mellitus without complications: Secondary | ICD-10-CM | POA: Diagnosis not present

## 2014-06-12 DIAGNOSIS — K219 Gastro-esophageal reflux disease without esophagitis: Secondary | ICD-10-CM | POA: Diagnosis not present

## 2014-06-19 DIAGNOSIS — E1029 Type 1 diabetes mellitus with other diabetic kidney complication: Secondary | ICD-10-CM | POA: Diagnosis not present

## 2014-06-19 DIAGNOSIS — Z720 Tobacco use: Secondary | ICD-10-CM | POA: Diagnosis not present

## 2014-06-19 DIAGNOSIS — E1122 Type 2 diabetes mellitus with diabetic chronic kidney disease: Secondary | ICD-10-CM | POA: Diagnosis not present

## 2014-06-26 DIAGNOSIS — E1029 Type 1 diabetes mellitus with other diabetic kidney complication: Secondary | ICD-10-CM | POA: Diagnosis not present

## 2014-06-26 DIAGNOSIS — E1165 Type 2 diabetes mellitus with hyperglycemia: Secondary | ICD-10-CM | POA: Diagnosis not present

## 2014-07-03 DIAGNOSIS — E119 Type 2 diabetes mellitus without complications: Secondary | ICD-10-CM | POA: Diagnosis not present

## 2014-07-03 DIAGNOSIS — E1029 Type 1 diabetes mellitus with other diabetic kidney complication: Secondary | ICD-10-CM | POA: Diagnosis not present

## 2014-07-03 DIAGNOSIS — R911 Solitary pulmonary nodule: Secondary | ICD-10-CM | POA: Diagnosis not present

## 2014-07-03 DIAGNOSIS — I10 Essential (primary) hypertension: Secondary | ICD-10-CM | POA: Diagnosis not present

## 2014-07-03 DIAGNOSIS — R1032 Left lower quadrant pain: Secondary | ICD-10-CM | POA: Diagnosis not present

## 2014-07-09 ENCOUNTER — Ambulatory Visit
Admit: 2014-07-09 | Disposition: A | Discharge status: Home / Self Care | Payer: Self-pay | Attending: Internal Medicine | Admitting: Internal Medicine

## 2014-07-09 DIAGNOSIS — R911 Solitary pulmonary nodule: Secondary | ICD-10-CM | POA: Diagnosis not present

## 2014-07-09 DIAGNOSIS — I2510 Atherosclerotic heart disease of native coronary artery without angina pectoris: Secondary | ICD-10-CM | POA: Diagnosis not present

## 2014-07-09 DIAGNOSIS — K760 Fatty (change of) liver, not elsewhere classified: Secondary | ICD-10-CM | POA: Diagnosis not present

## 2014-07-14 DIAGNOSIS — F17210 Nicotine dependence, cigarettes, uncomplicated: Secondary | ICD-10-CM | POA: Diagnosis not present

## 2014-07-14 DIAGNOSIS — J449 Chronic obstructive pulmonary disease, unspecified: Secondary | ICD-10-CM | POA: Diagnosis not present

## 2014-07-14 DIAGNOSIS — R911 Solitary pulmonary nodule: Secondary | ICD-10-CM | POA: Diagnosis not present

## 2014-07-21 ENCOUNTER — Ambulatory Visit
Admit: 2014-07-21 | Disposition: A | Discharge status: Home / Self Care | Payer: Self-pay | Attending: Internal Medicine | Admitting: Internal Medicine

## 2014-07-24 ENCOUNTER — Ambulatory Visit
Admit: 2014-07-24 | Disposition: A | Discharge status: Home / Self Care | Payer: Self-pay | Attending: Cardiothoracic Surgery | Admitting: Cardiothoracic Surgery

## 2014-07-24 DIAGNOSIS — C3431 Malignant neoplasm of lower lobe, right bronchus or lung: Secondary | ICD-10-CM | POA: Diagnosis not present

## 2014-07-24 DIAGNOSIS — R918 Other nonspecific abnormal finding of lung field: Secondary | ICD-10-CM | POA: Diagnosis not present

## 2014-07-24 DIAGNOSIS — R0602 Shortness of breath: Secondary | ICD-10-CM | POA: Diagnosis not present

## 2014-07-24 LAB — CBC CANCER CENTER
BASOS PCT: 0.7 %
Basophil #: 0.1 x10 3/mm (ref 0.0–0.1)
EOS PCT: 3 %
Eosinophil #: 0.3 x10 3/mm (ref 0.0–0.7)
HCT: 43.2 % (ref 35.0–47.0)
HGB: 14.4 g/dL (ref 12.0–16.0)
LYMPHS ABS: 4 x10 3/mm — AB (ref 1.0–3.6)
Lymphocyte %: 35.2 %
MCH: 30.6 pg (ref 26.0–34.0)
MCHC: 33.3 g/dL (ref 32.0–36.0)
MCV: 92 fL (ref 80–100)
Monocyte #: 0.6 x10 3/mm (ref 0.2–0.9)
Monocyte %: 5 %
NEUTROS PCT: 56.1 %
Neutrophil #: 6.4 x10 3/mm (ref 1.4–6.5)
Platelet: 328 x10 3/mm (ref 150–440)
RBC: 4.7 10*6/uL (ref 3.80–5.20)
RDW: 14.1 % (ref 11.5–14.5)
WBC: 11.4 x10 3/mm — ABNORMAL HIGH (ref 3.6–11.0)

## 2014-07-24 LAB — COMPREHENSIVE METABOLIC PANEL
ALBUMIN: 4.5 g/dL
Alkaline Phosphatase: 159 U/L — ABNORMAL HIGH
Anion Gap: 9 (ref 7–16)
BUN: 26 mg/dL — ABNORMAL HIGH
Bilirubin,Total: 0.5 mg/dL
CALCIUM: 9.8 mg/dL
CO2: 25 mmol/L
Chloride: 100 mmol/L — ABNORMAL LOW
Creatinine: 0.71 mg/dL
GLUCOSE: 213 mg/dL — AB
Potassium: 4.2 mmol/L
SGOT(AST): 39 U/L
SGPT (ALT): 37 U/L
Sodium: 134 mmol/L — ABNORMAL LOW
Total Protein: 8.7 g/dL — ABNORMAL HIGH

## 2014-07-24 LAB — PROTIME-INR
INR: 1
PROTHROMBIN TIME: 13.3 s

## 2014-07-24 LAB — APTT: Activated PTT: 35.8 secs (ref 23.6–35.9)

## 2014-07-29 DIAGNOSIS — Z01818 Encounter for other preprocedural examination: Secondary | ICD-10-CM

## 2014-07-29 DIAGNOSIS — R0602 Shortness of breath: Secondary | ICD-10-CM | POA: Diagnosis not present

## 2014-07-29 DIAGNOSIS — R918 Other nonspecific abnormal finding of lung field: Secondary | ICD-10-CM | POA: Diagnosis not present

## 2014-07-29 NOTE — H&P (Signed)
Subjective/Chief Complaint Rigth hip pain    History of Present Illness 59 year old diabetic female has advanced osteoarthritis of the right hip which causes her severe pain and inability to perform normal activities without discomfort.  She has been tried on NSAIDs, cane, rest, and bursal injections without relief.  She wishes to proceed with right total hip replacement.  Risks and benefits of surgery were discussed at length including but not limited to infection, non union, nerve or blood vessed damage, non union, need for repeat surgery, blood clots and lung emboli, and death. X-rays show advanced osteoarthritis of the hip with spurs, lateral subluxation, and cyst formation.    Past Medical Health spinal fusions, heart surgery, GB, C sections, CTRs, hysterectomy, nerve repair right arm.    Primary Physician [facility]   Past Med/Surgical Hx:  sleep apnea:   CHF:   cholilithasis:   diverticulitis:   depression:   HTN:   DM:   ALLERGIES:  Ampicillin: Anaphylaxis  Tape: Rash  Neurontin: Alt Ment Status  Ultram: N/V/Diarrhea  Avelox: Muscles aches  Meloxicam: N/V/Diarrhea  Voltaren Topical: N/V/Diarrhea  Humalog KwikPen: Other  Levemir FlexPen: Other  HOME MEDICATIONS: Medication Instructions Status  propranolol 40 mg oral tablet 1 tab(s) orally 2 times a day  Active  Advil 200 mg oral tablet 2 tab(s) orally 2-3 times a day, As Needed Active  diazepam 5 mg oral tablet 1 tab(s) orally once a day (at bedtime) Active  diphenhydrAMINE 25 mg oral tablet 1 tab(s) orally 2 times a day, As Needed for numbness in lips Active  estradiol 1 mg oral tablet 1 tab(s) orally once a day (at bedtime) Active  furosemide 40 mg oral tablet 1 tab(s) orally once a day (in the morning) Active  polyethylene glycol 3350 oral powder for reconstitution 1 packet(s) orally once a day (in the morning) Active  metformin 1000 mg oral tablet 1 tab(s) orally 2 times a day Active  tizanidine 4 mg oral tablet 1  tab(s) orally once a day (in the evening) Active  gemfibrozil 600 mg oral tablet 1 tab(s) orally 30 minutes before breakfast and evening meal. Active  alprazolam 0.25 mg oral tablet 1 tab(s) orally 2 times a day Active  promethazine 25 mg oral tablet 1 tab(s) orally every 4 to 6 hours as needed. Active   Family and Social History:   Family History Non-Contributory    Social History negative tobacco    Place of Living Home   Review of Systems:   Fever/Chills No    Cough No    Sputum No    Abdominal Pain No   Physical Exam:   GEN well developed, well nourished, no acute distress    HEENT pink conjunctivae    NECK supple    RESP normal resp effort    CARD regular rate    ABD denies tenderness    LYMPH negative neck    EXTR Pain with range of motion of right hip.  range of motion 5* internal rotation  and 25* external rotation. Flexes to 85*.  Leg lengths equal.  circulation/sensation/motor function good.  skin intact.    SKIN normal to palpation    NEURO motor/sensory function intact    PSYCH alert, A+O to time, place, person, good insight   Radiology Results: XRay:    26-Mar-13 14:11, Chest PA and Lateral   Chest PA and Lateral   REASON FOR EXAM:    chest pain  COMMENTS:   May transport  without cardiac monitor    PROCEDURE: DXR - DXR CHEST PA (OR AP) AND LATERAL  - Jul 05 2011  2:11PM     RESULT: The lung fields are clear. The heart, mediastinal and osseous   structures show no significant abnormalities.    IMPRESSION:   1. No acute changes are identified.    Thank you for the opportunity to contribute to the care of your patient.           Verified By: Dionne Ano WALL, M.D., MD    26-Apr-13 12:56, Chest PA and Lateral   Chest PA and Lateral   REASON FOR EXAM:    chest pain    hall 1  COMMENTS:   LMP: Post-Menopausal    PROCEDURE: DXR - DXR CHEST PA (OR AP) AND LATERAL  - Aug 05 2011 12:56PM     RESULT: Comparison: 07/05/2011    Findings:  The  heart and mediastinum are stable, given patientrotation. No focal   pulmonary opacities.    IMPRESSION:   No acute cardiopulmonary disease.        Verified By: Gregor Hams, M.D., MD  [place]:    26-Mar-13 21:40, [place] Color Flow Doppler Low Extrem Bilat   [place] Color Flow Doppler Low Extrem Bilat   REASON FOR EXAM:    pain  COMMENTS:       PROCEDURE: [place]  - US DOPPLER LOW EXTR BILATERAL  - Jul 05 2011  9:40PM     RESULT: Comparison: Right lower extremity ultrasound 05/19/2010.  Left   lower extremity ultrasound 05/14/2010    Findings: Multiple longitudinal and transverse gray-scale as well as   color and spectral Doppler images of the bilateral lower extremity veins   were obtained from the common femoral veins through the popliteal veins.    The bilateral common femoral, femoral, and popliteal veins are patent,   demonstrating normal color-flow and compressibility. No intraluminal   thrombus is identified.  There is normal respiratory variation and     augmentation demonstrated at all vein levels.    IMPRESSION:  No evidence of DVT in the bilateral lower extremities.          Verified By: Gregor Hams, M.D., MD    24-Jul-13 13:29, US Pelvis Ultrasound Exam with Transvaginal - NON-OB   US Pelvis Ultrasound Exam with Transvaginal - NON-OB   REASON FOR EXAM:    left ovarian pain  was told had a growth on ovary 2012  COMMENTS:       PROCEDURE: KUS - KUS PELVIS NON-OB W/TRANSVAGINAL  - Nov 02 2011  1:29PM     RESULT: Comparison: None    Technique: Multiple transabdominal gray-scale images and endovaginal   gray-scale images of the pelvis performed.     Findings:    The uterus is surgically absent. The right ovary is surgically absent.    The left ovary is suboptimally visualized. There is a 2.5 x 1.4 x 1.7 cm     echogenic structure which may represent the left ovary. There is no left   adnexal mass.    There is no pelvic free fluid.    IMPRESSION:       Please see  above.    Dictation Site: 1          Verified By: Jennette Banker, M.D., MD  CT:    26-Mar-13 20:17, CT Chest for Pulm Embolism With Contrast   CT Chest for Pulm Embolism With Contrast  REASON FOR EXAM:    cp, shortness of breath  COMMENTS:       PROCEDURE: CT  - CT CHEST (FOR PE) W  - Jul 05 2011  8:17PM     RESULT: Comparison: 01/12/2007    Technique: Multiple thin section axial images were obtained from the lung   apices to the upper abdomen following 75 ml Isovue 370 intravenous   contrast, according to the PE protocol. These images were also reviewed   on a Siemens multiplanar work station.    Findings:   No mediastinal, hilar, or axillary lymphadenopathy. Surgical clips seen   from prior cholecystectomy.  The thoracic aorta is normal in caliber. No pulmonary embolus identified.    There is mild centrilobular emphysema. There is a small calcified nodule   in the subpleural right upper lobe. There is a 7 mm nodule in the   posterior right lower lobe which is new from prior, image 47. There is a   3 mm subpleural nodule in the right lower lobe which is similar to prior.   There is a 2 mm nodule along the right minor fissure which is likely   calcified. There is a smallbulla in the anterior medial left upper lobe.   There is a 3 mm nodule in the posterior right upper lobe which is similar   to prior given differences in slice selection.     Small round lucency in the T12 vertebral body is similar to prior and may   represent a hemangioma.    IMPRESSION:   1. No pulmonary embolus identified.  2. New indeterminate 7 mm nodule in the right lower lobe. A followup   noncontrast chest CT is recommended in 3 months.          Verified By: Gregor Hams, M.D., MD  [facility]:    [phone] 08:52, Digital Screen Mammogram   Digital Screen Mammogram   REASON FOR EXAM:    SCRN MAMMO  COMMENTS:       PROCEDURE: MAM - MAM DGTL SCREENING MAMMO W/CAD  - Feb 22 2011  8:52AM      RESULT: Comparison is made to prior studies dated 02/10/2008 and   11/22/2006.     The breasts demonstrate a mild heterogeneous parenchymal pattern. There   is no radiographic evidence to suggest malignancy.     IMPRESSION:     BI-RADS: Category 2 - Benign Findings     Thank you for this opportunity to contribute to the care of your patient.    A NEGATIVE MAMMOGRAM REPORT DOES NOT PRECLUDE BIOPSY OR OTHER EVALUATION   OF A CLINICALLY PALPABLE OR OTHERWISE SUSPICIOUS MASS OR LESION. BREAST   CANCER MAY NOT BE DETECTED BY MAMMOGRAPHY IN UP TO 10% OF CASES.           Verified By: Mikki Santee, M.D., MD     Assessment/Admission Diagnosis Advanced osteoarthritis right hip    Plan Right total hip replacement   Electronic Signatures: Park Breed (MD)  (Signed 14-Oct-13 17:19)  Authored: CHIEF COMPLAINT and HISTORY, PAST MEDICAL/SURGIAL HISTORY, ALLERGIES, HOME MEDICATIONS, FAMILY AND SOCIAL HISTORY, REVIEW OF SYSTEMS, PHYSICAL EXAM, Radiology, ASSESSMENT AND PLAN   Last Updated: 14-Oct-13 17:19 by Park Breed (MD)

## 2014-07-29 NOTE — Discharge Summary (Signed)
PATIENT NAME:  Marie [name], Marie [name] MR#:  [id number] DATE OF BIRTH:  [birth year]  DATE OF ADMISSION:  01/24/2012 DATE OF DISCHARGE:  01/27/2012  FINAL DIAGNOSES:  1. Advanced osteoarthritis, right hip.  2. Diabetes mellitus.  3. Hypertension.  4. History of congestive heart failure.  5. Sleep apnea.  6. Diverticulitis.  7. Depression.   OPERATION: 01/24/2012 cementless DePuy AML right total hip replacement.   COMPLICATIONS: None.   CONSULTATIONS: None.   HOME MEDICATIONS:  1. Norco 5/325 p.r.n. pain.  2. Enteric-coated aspirin one p.o. b.i.d. for six weeks. 3. Celebrex 200 mg daily, 30 tablets. 4. Lyrica 75 mg b.i.d., 30 tablets. 5. Propranolol 40 mg b.i.d.  6. Diazepam 5 mg at bedtime.  7. Benadryl p.r.n.  8. Furosemide 40 mg daily.  9. Metformin 1000 mg b.i.d.  10. Tizanidine 4 mg daily.  11. Gemfibrozil 600 mg b.i.d.  12. Alprazolam 0.25 mg b.i.d.  13. Promethazine 25 mg p.r.n.   HISTORY OF PRESENT ILLNESS: The patient is a 59 year old diabetic female with advanced osteoarthritis of the right hip. This caused her severe pain and inability to perform normal activities without discomfort. She had been tried on NSAIDs, cane, rest, and bursal injections without relief. X-rays showed advanced osteoarthritis of the hip with spurs, lateral subluxation and cyst formation. The joint space was completely collapsed. The patient wished to proceed with surgery and the risks and benefits have been discussed with her at length along with postoperative protocol.   PAST MEDICAL HISTORY:  ILLNESSES:  As above.   OPERATIONS: 1. Spinal fusion. 2. Heart surgery. 3. Gallbladder surgery. 4. Cesarean section. 5. Carpal tunnel release.  6. Hysterectomy.  7. Nerve repair right arm.  PRIMARY CARE DOCTOR: Dr. Cletis Athens    ALLERGIES: Ampicillin, tape, Neurontin, Ultram, Avelox, meloxicam, Voltaren topical, Humalog, and Levemir FlexPen.   REVIEW OF SYSTEMS: Unremarkable.    FAMILY HISTORY:  Unremarkable.   SOCIAL HISTORY: The patient does not smoke. She lives at home.   PHYSICAL EXAMINATION: The patient was alert and cooperative with normal vital signs. The right hip showed severe pain with range of motion. Flexion was 85 degrees, internal rotation 5 degrees, external rotation 25 degrees. Leg lengths were equal. Previous back surgery incisions were well healed.   LABORATORY DATA: Laboratory data on admission was satisfactory.   HOSPITAL COURSE: On 01/24/2012 the patient underwent cementless right total hip replacement. Postoperatively she did quite well. Hemoglobin dropped to 9.5 on the second postop day. She was ambulated and making good progress on the third postop day. She is discharged to home with home PT. She will weight-bear as tolerated with a walker. She will be seen in my office in two weeks for exam and x-rays.   ____________________________ Park Breed, MD hem:drc D: 01/27/2012 14:01:09 ET T: 01/28/2012 15:41:58 ET JOB#: [id number]  cc: Park Breed, MD, <Dictator> Cletis Athens, MD Park Breed MD ELECTRONICALLY SIGNED 01/30/2012 10:47

## 2014-07-29 NOTE — Op Note (Signed)
PATIENT NAME:  Marie [name], Marie [name] MR#:  [id number] DATE OF BIRTH:  [birth year]  DATE OF PROCEDURE:  01/24/2012  PREOPERATIVE DIAGNOSIS: Advanced osteoarthritis, right hip.   POSTOPERATIVE DIAGNOSIS: Advanced osteoarthritis, right hip.   PROCEDURE PERFORMED: Right DePuy AML cementless total hip replacement (13.5 mm femoral stem, 54 mm Pinnacle 100 cup, neutral polyethylene liner, and 36 mm head with a -2 mm neck length).   SURGEON: Park Breed, M.D.   ASSISTANT: Thornton Park, MD  ANESTHESIA: General endotracheal.   COMPLICATIONS: None.   DRAINS: Two Autovac.   ESTIMATED BLOOD LOSS: 350 mL.  REPLACEMENTS: None.   DESCRIPTION OF PROCEDURE: The patient was brought to the Operating Room where she underwent satisfactory general endotracheal anesthesia, in the supine position. Spinal was contraindicated due to her prior spinal fusions. She was turned in the left lateral decubitus position, padded appropriately, and stabilized with hip grips. The right hip was then prepped and draped in sterile fashion and a posterolateral incision made. Dissection was carried out sharply through subcutaneous tissue and fascia and the Charnley retractor was inserted. Electrocautery was used for hemostasis. Short external rotators were identified and cut and tagged. The posterior capsule was opened in a T-fashion and the femoral head was dislocated easily. The femoral neck was cut at the appropriate angle and retractors inserted to expose the acetabulum. The entire labrum was removed. Soft tissue was removed from the acetabular depths. The transverse acetabular ligament was removed. Reamers were introduced starting at 47 mm in size. The femoral head had measured 49 mm. The acetabulum was sequentially reamed to 53 mm. 54 mm trial acetabular component fit snugly and was removed. The acetabulum was thoroughly irrigated and a 54 mm Pinnacle 100 cup with three spikes was inserted in 45 degrees of abduction and 25  degrees of anteversion. This fit quite snugly and bottomed out well. The trial neutral liner was inserted. The femoral canal was then opened and broached and sequentially rasped with trial femoral stems. A 13.5 mm wide body stem was introduced and fit snugly. Calcar was reamed to smooth this off. Trial reduction was carried out with a +2 mm and then a  -2 mm, 36 mm head and neck. The +2 mm head was too tight and slightly long. It was elected to go with a -2 mm neck length. This was quite stable and had no pistoning. The remaining trials were removed and the neutral polyethylene liner inserted. The 13.5 wide AML femoral stem was inserted and seated quite snugly. After one more trial reduction, a 36 mm head with a -2 length was attached and the hip was reduced. Leg lengths were excellent and stability was excellent. There was no pistoning. The wound was irrigated again. The posterior capsule was closed with #2 Tycron. The short external rotators were repaired with the same suture. The deep fascia was closed with a #2 quill suture over an Autovac drain and the subcutaneous tissue was closed with 2-0 Vicryl over another Autovac drain. The skin was closed with staples. Dry sterile dressing was applied and the Autovac was activated. The patient was transferred to her hospital bed and extubated. The hip was stable and leg lengths were equal. She was taken to recovery in good condition. ____________________________ Park Breed, MD hem:slb D: 01/24/2012 15:55:45 ET T: 01/24/2012 17:17:14 ET JOB#: [id number]  cc: Park Breed, MD, <Dictator> Park Breed MD ELECTRONICALLY SIGNED 01/25/2012 13:37

## 2014-07-31 DIAGNOSIS — C3431 Malignant neoplasm of lower lobe, right bronchus or lung: Secondary | ICD-10-CM | POA: Diagnosis not present

## 2014-08-02 NOTE — H&P (Signed)
PATIENT NAME:  Marie [name], Marie [name] MR#:  [id number] DATE OF BIRTH:  April 21, 1955  DATE OF ADMISSION:  01/23/2014  PRIMARY CARE PHYSICIAN:  Cletis Athens, MD  REFERRING PHYSICIAN:  Marjean Donna, MD  CHIEF COMPLAINT:  Generalized body aches, cramping.  HISTORY OF PRESENT ILLNESS:  Ms. Marie [name] is a 59 year old female with history of type 2 diabetes mellitus, chronic left foot ulcer, sleep apnea, and congestive heart failure, who had infected foot ulcer debrided by podiatry and was treated with levofloxacin. The patient was noted to have worsening of the left foot ulcer, concerning this came back to Dr. Vickki Muff. The patient underwent MRA of the left foot, which showed septic arthritis with possible osteomyelitis. The patient underwent excision of the left second toe. PICC line was placed. Cultures came back; they were negative. The patient was getting treatment empirically with vancomycin. The patient was discharged on 01/18/2014, with the plan to follow up with Dr. Ola Spurr. The patient has been having nausea, vomiting, and diarrhea. Last night, when the patient woke up after taking the vancomycin, she started having muscle cramps all over the body, concerning this called home health, who recommended the patient to go to the Emergency Department.   INITIAL WORKUP IN THE EMERGENCY DEPARTMENT:  The patient was found to be hypotensive with a systolic blood pressure in the 70s. The patient received 3 liters of IV fluids with improvement of the blood pressure over 100 with improvement of the heart rate. No obvious source of infection is found. The patient's foot does not seem to have any more redness from the baseline. The patient has right PICC line dressing, which was dirty, however, shows only mild redness around the PICC line site. Urinalysis and chest x-ray does not indicate any source of infection. Blood cultures have been obtained. Lactic acid is 1.8. The patient is receiving aztreonam in the Emergency  Department.   PAST MEDICAL HISTORY:   1.  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 2.  Diabetes mellitus on oral medication. 3.  Hypertension. 4.  Depression. 5.  Continued tobacco use. 6.  Hyperlipidemia. 7.  Congestive heart failure. 8.  History of diverticulitis.  PAST SURGICAL HISTORY:   1.  Hysterectomy. 2.  Status post right hip surgery.  ALLERGIES TO:   1.  AMPICILLIN. 2.  NEURONTIN. 3.  AVALOX. 4.  ULTRAM. 5.  MELOXICAM. 6.  TAPE. 7.  HUMALOG. 8.  LEVEMIR.  HOME MEDICATIONS: 1.  Vancomycin 750 mg every 8 hours as needed.  2.  Senna 1 tablet 2 times a day. 3.  Propanolol 40 mg 2 times a day. 4.  Pregabalin 75 mg 2 times a day. 5.  MiraLax 17 grams once a day. 6.  Metformin 500 mg 2 times a day. 7.  Magnesium hydroxide 30 mL once a day. 8.  Glipizide 5 mg 2 times a day. 9.  Gemfibrozil 600 mg every 30 minutes. 10.  Lasix 40 mg once a day. 11.  Fluconazole 100 mg once a day. 12.  Estradiol 1 mg once a day. 13.  Diphenhydramine 1 tablet 2 times a day. 14.  Cymbalta 90 mg once a day. 15.  Cyclobenzaprine 10 mg 3 times a day. 16.  Aspirin 81 mg once a day. 17.   Acetaminophen 1 tablet every 4 to 6 hours as needed.   SOCIAL HISTORY:  Continues to smoke 1 to 1-1/2 packs a day. Denies drinking alcohol or using illicit drugs. Uses marijuana  on a daily basis. Lives with her son.   FAMILY HISTORY:  Positive for stroke, diabetes mellitus, hypertension, and coronary artery disease.   REVIEW OF SYSTEMS:  All systems have been reviewed. CONSTITUTIONAL:  Experiencing generalized weakness. EYES:  No change in vision. EARS, NOSE, AND THROAT:  No change in hearing. RESPIRATORY:  Has mild cough. No productive sputum. GASTROINTESTINAL:  Has nausea and vomiting. Has been having diarrhea. GENITOURINARY:  No dysuria or hematuria. HEMATOLOGIC:  No easy bruising or bleeding. SKIN:  No rash or lesions. MUSCULOSKELETAL:  No  joint pains. NEUROLOGIC:  No weakness or numbness in any part of the body.  PHYSICAL EXAMINATION: GENERAL:  This is a well-built, well-nourished, age-appropriate female lying down on the bed, not in distress. VITAL SIGNS:  Temperature 99, pulse 110, blood pressure 113/85, respiratory rate 24, oxygen saturation 94% on room air. HEENT:  Head is normocephalic and atraumatic. There is no scleral icterus. Conjunctivae are normal. Pupils are equal and react to light. Mucous membranes are moist. No pharyngeal erythema. NECK:  Supple. No lymphadenopathy. No JVD. No carotid bruit. CHEST:  Has no focal tenderness.  LUNGS:  Bilaterally clear to auscultation. HEART:  S1, S2 regular. No murmurs are heard. ABDOMEN:  Bowel sounds present. Soft, nontender, nondistended. No hepatosplenomegaly. EXTREMITIES:  Left foot has sutures in place, mild redness around the site. No fluctuance or discharge. Pulses 2+. NEUROLOGIC:  The patient is alert and oriented to place, person, and time. Cranial nerves II through XII are intact. Motor is 5/5 in upper and lower extremities.  LABORATORY AND IMAGING DATA:  Urinalysis is negative for nitrites and leukocyte esterase.   Left foot, complete:  Post osteotomy involving the distal aspect of the second metatarsal base of the proximal phalanx of the second digit with placement of the antibiotic spacer device.   CMP is completely within normal limits. Magnesium is 1.5. CBC is completely within normal limits.   ASSESSMENT AND PLAN:  Ms. Marie [name] is a 59 year old female who comes to the Emergency Department with hypotension.  1.  Hypotension, highly concerning about septic shock. The patient does not have any fever, does not have any high white blood cell count. The differential diagnosis is infection versus dehydration from diarrhea. The patient responded to the intravenous fluids. Continue with intravenous fluids and follow up. Blood cultures have been obtained. The patient has a  normal lactic acid; however, we will start the patient on aztreonam. If the cultures are negative, the aztreonam could be discontinued. We will continue the vancomycin.   2.  Left foot osteomyelitis. Continue with vancomycin. 3.  Diabetes mellitus on oral medication. Hold metformin. We will continue the glipizide and sliding scale insulin. 4.  Depression. Continue with Cymbalta. 5.  Keep the patient on deep vein thrombosis prophylaxis with Lovenox.  TIME SPENT:  55 minutes.   ____________________________ Monica Becton, MD pv:nb D: 01/23/2014 04:43:45 ET T: 01/23/2014 06:14:49 ET JOB#: [id number]  cc: Monica Becton, MD, <Dictator> Cletis Athens, MD  Monica Becton MD ELECTRONICALLY SIGNED 01/23/2014 21:09

## 2014-08-02 NOTE — Discharge Summary (Signed)
PATIENT NAME:  Marie, [name] MR#:  [id number] DATE OF BIRTH:  08-22-1955  DATE OF ADMISSION:  01/13/2014 DATE OF DISCHARGE:  01/18/2014  DISCHARGE DIAGNOSES:  1. Left second toe osteomyelitis with joint abscess, status post excision of left second toe.  2. Type 2 diabetes mellitus with complication.  3. Hypertension.  4. Depression.  5. Tobacco abuse.  6. Hypertension.  7. Hyperlipidemia.   DISCHARGE MEDICATIONS:  1. Gemfibrozil 600 mg p.o. 30 minutes before breakfast and evening meal.  2. Diphenhydramine 20 mg  p.o. b.i.d.  3. Lipitor 40 mg p.o. daily. 4. Aspirin 81 mg p.o. daily.  5. Metformin 500 mg p.o. b.i.d.   6. Cymbalta 30 mg p.o. t.i.d.  7. Tramadol 50 mg p.o. every 8 hours as needed. 8. Flexeril 10 mg p.o. t.i.d.  9. Propranolol 40 mg p.o. b.i.d.  10. Glipizide 5 mg p.o. b.i.d.  11. Percocet 5/325 1 tablet every 4 to 6 hours as needed.  12. Lyrica 75 mg p.o. b.i.d.  13. MiraLax as needed for constipation.  14. Fluconazole 100 mg p.o. daily for 10 days.   15. Vancomycin 750 mg every 8 hours, stop on November 11.  PICC line as per protocol.  DIET: Low sodium ADA diet.    Discharge home with home health RN for IV antibiotics.  The patient will follow up with Dr. Ola Spurr.  He is off this week.  He will come next week. We will fax the information about  antibiotics. The patient was discharged home with vancomycin for osteomyelitis and she needs to take for 6 weeks. Follow up with Dr. Lavera Guise in a week and follow up with Dr. Samara Deist in 1 to 2 weeks.   CONSULTATIONS: Podiatric consult with Dr. Samara Deist.   ALLERGIES:  AMPICILLIN, AVELOX, HUMALOG, LEVEMIR, MELOXICAM, NEURONTIN AND [place]: This patient is a 59 year old female patient with acute diabetes mellitus and also hypertension, history of left foot ulcers before, presenting from Dr. Alvera Singh office because of worsening ulcer on the left foot. The patient sent in because of worsening  infection with possible osteomyelitis. She was given vancomycin and Zosyn. The patient had an MRI of left foot which showed the patient has a finding suspicious for septic joint marking second metatarsal phalangeal joint with adjacent abscess formation in the second webbed space.  There is associated dorsal subluxation with low-grade edema enhancement in second metatarsal head and proximal phalangeal base worrisome for early osteomyelitis. The patient's white count was normal. Electrolytes were normal on admission. She received IV fluids. Blood cultures have been negative. Hip x-ray has been negative.  The patient was seen by Dr. Vickki Muff and the patient was taken to OR on October 7 and she had excision of proximal phalanx of second toe and also excision of second metatarsal head on the left foot and drainage of abscess. The patient has significant pain, so she required IV morphine 4 mg every 4 hours. The patient's dressings are being changed by Dr. Vickki Muff. He suggested non-weightbearing for the left leg for about 6 weeks.  The patient's wound cultures from the OR from the left foot did not show any growth. I have spoken with pharmacy because she is allergic to penicillins and also Avelox and we have decided that vancomycin is the best option and the patient's vancomycin levels were monitored here at [facility] and the pharmacist listed the patient can have 750 mg every 8 hours until November 11 and weekly peak and  troughs will be done by home health RN and the patient will have weekly CBC and vanco  trough level,.. The patient got a PICC line and she will be getting vancomycin for 6 weeks. Dressing changes will be done by home R.N. Advised her to be nonweightbearing for 6 weeks on the left leg and she is to follow up with Dr. Samara Deist. Other than that she was stable and she is discharged home with Percocet.   DISCHARGE VITAL SIGNS: Temperature 98.2, heart rate 77, blood pressure 108/72, saturation 93%.  The patient is referred for rehabilitation or skilled nursing because of IV antibiotics, but the patient refused to go to rehabilitation. She went home in stable condition.   TIME SPENT: More than 30 minutes.   PRIMARY CARE PHYSICIAN: Cletis Athens, MD.    ____________________________ Epifanio Lesches, MD sk:JT D: 01/18/2014 21:47:00 ET T: 01/18/2014 23:59:46 ET JOB#: [id number]  cc: Cletis Athens, MD Pete Glatter. Vickki Muff, DPM Epifanio Lesches, MD, <Dictator>     Epifanio Lesches MD ELECTRONICALLY SIGNED 02/01/2014 18:10

## 2014-08-02 NOTE — Op Note (Signed)
PATIENT NAME:  Marie [name], Marie [name] MR#:  [id number] DATE OF BIRTH:  Jun 16, 1955  DATE OF PROCEDURE:  01/16/2014  PREOPERATIVE DIAGNOSIS:  Left second toe metatarsophalangeal joint osteomyelitis with abscess.  POSTOPERATIVE DIAGNOSIS: Left second toe metatarsophalangeal joint osteomyelitis with abscess.  PROCEDURES:  1. Ultrasound guidance for vascular access to right basilic vein.  2. Fluoroscopic guidance for placement of catheter.  3. Insertion of peripherally inserted central venous catheter, double-lumen, right arm.  SURGEON: Algernon Huxley, MD  ANESTHESIA: Local.   ESTIMATED BLOOD LOSS: Minimal.   INDICATION FOR PROCEDURE: This is a 59 year old female with osteomyelitis and abscess of her left foot. She has undergone surgery but will require extended IV antibiotics and we are asked to place a PICC line. Risks and benefits were discussed and informed consent was obtained.  DESCRIPTION OF PROCEDURE: The patient's right arm was sterilely prepped and draped, and a sterile surgical field was created. The right basilic vein was accessed under direct ultrasound guidance without difficulty with a micropuncture needle and permanent image was recorded. 0.018 wire was then placed into the superior vena cava. Peel-away sheath was placed over the wire. A single lumen peripherally inserted central venous catheter was then placed over the wire and the wire and peel-away sheath were removed. The catheter tip was placed into the superior vena cava and was secured at the skin at 36 cm with a sterile dressing. The catheter withdrew blood well and flushed easily with heparinized saline. The patient tolerated procedure well.  ____________________________ Algernon Huxley, MD jsd:sb D: 01/20/2014 09:27:38 ET T: 01/20/2014 10:34:16 ET JOB#: [id number]  cc: Algernon Huxley, MD, <Dictator> Algernon Huxley MD ELECTRONICALLY SIGNED 01/25/2014 12:34

## 2014-08-02 NOTE — Op Note (Signed)
PATIENT NAME:  Marie [name], Marie [name] MR#:  [id number] DATE OF BIRTH:  12-May-1955  DATE OF PROCEDURE:  01/15/2014  PREOPERATIVE DIAGNOSIS: Osteomyelitis left second toe joint with abscess.   POSTOPERATIVE DIAGNOSIS: Osteomyelitis left second toe joint with abscess.   PROCEDURES:  1.  Excision of the proximal phalanx second toe.  2.  Excision of second metatarsal head left foot with drainage of abscess.   ANESTHESIA: General.   HEMOSTASIS: Mid calf tourniquet inflated to 250 mmHg for 29 minutes.   COMPLICATIONS: None.   SPECIMEN: Second MTPJ osteomyelitis and second MTPJ soft tissue abscess.   OPERATIVE INDICATIONS: This is a 59 year old female admitted to the hospital with a red swollen left second toe joint. She has a history of ulceration with diabetes and neuropathy. MRI revealed abscessed area around the second toe joint with concern for osteomyelitis of the second metatarsal head and base of the proximal phalanx of the second toe. She was brought to the OR for operative I and D and evaluation of the joint with possible excision of the metatarsophalangeal joint.   OPERATIVE PROCEDURE: The patient was brought into the OR and placed on the operating table in the supine position. General sedation was administered by the anesthesia team. A local block was placed around the second toe joint. After sterile prep and drape and inflation of the tourniquet a curvilinear incision was made over the second MTPJ to the second web space. Sharp and blunt dissection was carried down to the long extensor tendon. This was retracted medially. The second MTPJ had a noted prominent soft tissue mass with central abscess within the lesion. I was able to excise this in toto and sent this for pathological examination. The second toe joint was noted to be completely dislocated dorsally on the metatarsal head. The MTPJ was opened. The base of the proximal phalanx was very soft with obvious minor erosions within the base of  the proximal phalanx consistent with osteomyelitis where the abscess was overlying the area. At this time with a power saw I was able to remove the base of the proximal phalanx and will send this for pathological examination. At this time the metatarsal head was inspected and it had an irregular appearance on the distal aspect with areas of cartilage damage and areas concerning for osteomyelitis. The head of the second metatarsal was then excised at this time as well. The wound was flushed with 3 liters of saline with a pulse lavage. The tourniquet was dropped and hemostasis was achieved with a combination of Bovie cautery and Surgiflo with thrombin. Vancomycin-impregnated Stimulan beads were then formed and these were packed into the wound after hemostasis was obtained. At this time the skin was then closed with a 4-0 nylon. A well compressive sterile gauze with dressing was applied to the dorsal foot. She will be readmitted to the floor and continue with IV antibiotics. Hopefully we can discharge her in the next 1-2 days with a PICC line. She will likely need 6 weeks of IV antibiotics.    ____________________________ Pete Glatter Vickki Muff, DPM jaf:bu D: 01/15/2014 16:59:50 ET T: 01/15/2014 20:23:11 ET JOB#: [id number]  cc: Larkin Ina A. Vickki Muff, DPM, <Dictator> Carol Theys DPM ELECTRONICALLY SIGNED 01/23/2014 12:38

## 2014-08-02 NOTE — Consult Note (Signed)
PATIENT NAME:  Marie [name], Marie [name] MR#:  [id number] DATE OF BIRTH:  11-18-1955  DATE OF CONSULTATION:  01/24/2014  REFERRING PHYSICIAN:   CONSULTING PHYSICIAN:  Cheral Marker. Ola Spurr, MD  REQUESTING PHYSICIAN:  Dr. Volanda Napoleon.    REASON FOR CONSULTATION: Sepsis and osteomyelitis.   HISTORY OF PRESENT ILLNESS: This is a pleasant 59 year old female who was admitted October 5th through the 10th with osteomyelitis of the left second toe with a joint abscess. She underwent excision of the left second toe and was discharged on vancomycin and fluconazole. Cultures grew coag-negative staph. She has been getting her IV antibiotics at home. She was readmitted October 15th with generalized body aches and cramping. She says that when she would give herself vancomycin she would start getting severe body aches all over. She had some nausea, vomiting, and diarrhea. She says she has been having diarrhea off and on for several days now as well. In the Emergency Room she was found to be hypotensive and was given IV fluids. Since admission she has been seen by podiatry who feels her foot is improving. Urinalysis and chest x-ray were negative. She was started on aztreonam and continued on the vancomycin.   PAST MEDICAL HISTORY:  1. Left second toe osteomyelitis and septic arthritis status post excision of the left second toe last week.  2. Diabetes.  3. Hypertension.  4. Depression.  5. Tobacco abuse.  6. Hyperlipidemia.  7. History of CHF.   8. History of diverticulitis.   PAST SURGICAL HISTORY: Hysterectomy and status post right hip surgery.   ALLERGIES: AMPICILLIN, NEURONTIN, AVELOX, ULTRAM, MELOXICAM, TAPE,  HUMALOG, LEVEMIR.    ANTIBIOTICS SINCE ADMISSION: Include vancomycin and aztreonam.   SOCIAL HISTORY: She lives with her son. Uses marijuana and smokes daily.   FAMILY HISTORY: Positive for stroke, diabetes, hypertension, and coronary artery disease.   REVIEW OF SYSTEMS: 11 systems reviewed and negative  except as per HPI.    PHYSICAL EXAMINATION:  VITAL SIGNS: Temperature on admission 99, currently 97.7, pulse 88, blood pressure 110/71, respirations 21, saturation 96% on room air.  GENERAL: She is pleasant. She is interactive. She is in no acute distress, somewhat disheveled.  HEENT: Pupils equal, round, and reactive to light and accommodation. Extraocular movements are intact. Sclerae are anicteric.  OROPHARYNX: Clear.  NECK: Supple.  HEART: Regular.  LUNGS: Clear.  ABDOMEN: Soft, nontender, nondistended. No hepatosplenomegaly.  VASCULAR ACCESS: She has a PICC line in her right upper extremity which apparently is within normal limits. Apparently she had some dirt on the dressing when she was seen in the ED.  EXTREMITIES: Her foot is wrapped post surgical.  NEUROLOGIC: She is alert and oriented x 3. Grossly nonfocal neurologic exam.   DATA:  White blood count on admission was 7.9, currently 5.0, hemoglobin 8.5, platelets 206,000. Blood cultures 1 of 2 has grown gram-negative rods from August 15. Of note when she was admitted with her infection in her foot on October 5 her white count was 12. Blood cultures at that time were negative. Wound culture grew coag-negative staph. Renal function shows currently a creatinine of 0.73. LFTs have an elevated alkaline phosphatase at 185, AST 70, ALT 46. CK is normal at 48. Vancomycin level was 6. Drug screen on October 5 was positive for marijuana.   IMAGING: Foot x-ray done this admission showed no discrete areas of residual osteomyelitis. Urinalysis this admission showed 1 white cell.   IMPRESSION: A 59 year old with multiple medical problems, who recently had amputation of her  toe and was home on vancomycin when she came in with body aches all over and some nausea, vomiting, and a few days of diarrhea. Her white count was normal and she is afebrile, but she was quite hypertensive. She has grown gram-negative rods in her blood culture. Clinically she is  stabilized.   I doubt the foot is the source. Podiatry has assessed and thinks it is healing well. Her prior cultures just grew coag-negative staph. She does have some abdominal symptoms which could be associated with diverticulitis. She is not currently having any diarrhea.   RECOMMENDATIONS:  1.  Continue aztreonam and vancomycin pending culture results.  2.  Add Flagyl for intra-abdominal process potentially. If her abdominal pain worsens I would suggest CT of her abdomen to evaluate for diverticulitis.  3.  We could consider removing the PICC line as there is some mild tenderness there.  4.  Thank you for the consult. I will be glad to follow with you.    ____________________________ Cheral Marker. Ola Spurr, MD dpf:bu D: 01/24/2014 17:10:02 ET T: 01/24/2014 18:13:16 ET JOB#: [id number]  cc: Cheral Marker. Ola Spurr, MD, <Dictator> Eziah Negro Ola Spurr MD ELECTRONICALLY SIGNED 02/04/2014 9:54

## 2014-08-02 NOTE — H&P (Signed)
PATIENT NAME:  Marie [name], Marie [name] MR#:  [id number] DATE OF BIRTH:  [birth year]  DATE OF ADMISSION:  01/13/2014  PRIMARY CARE PHYSICIAN: Dr. Cletis Athens.   REFERRING PHYSICIAN: Dr. Vickki Muff, podiatry.   CHIEF COMPLAINT: Infected foot ulcer.   HISTORY OF PRESENT ILLNESS: A 59 year old female with a past history of type 2 diabetes, chronic left foot ulcer, sleep apnea. History of CHF, diverticulitis, benign hypertension, depression, and right hip surgery who was here in the hospital in August for same complaint, infected foot ulcer and some debridement was done by podiatry and was sent home on Levaquin. She finished the course and was doing okay, but then she had a visit with Dr. Vickki Muff last week and then again today and her ulcer is getting worse again with some reddening and infection so Dr. Vickki Muff called for direct admission to the hospital for IV antibiotic and rule out osteomyelitis with MRI. She denies any fever.  REVIEW OF SYSTEMS: CONSTITUTIONAL: Negative for fever, fatigue, weakness, pain, or weight loss.  EYES: No blurring, double vision, discharge, or redness.  EARS, NOSE, THROAT: No tinnitus, ear pain, or hearing loss.  RESPIRATORY: No cough, wheezing, hemoptysis, or shortness of breath.  CARDIOVASCULAR: No chest pain, orthopnea, edema, arrhythmia, palpitations.  GASTROINTESTINAL: No nausea, vomiting, diarrhea, abdominal pain.  GENITOURINARY: No dysuria, hematuria, increased frequency.  ENDOCRINE: No heat or cold intolerance. No excessive sweating.  SKIN: No acne, rashes, or lesions.  MUSCULOSKELETAL: No pain or swelling in the joints.  NEUROLOGICAL: No numbness, weakness, tremor, or vertigo.  PSYCHIATRIC: No anxiety, insomnia, bipolar disorder.   PAST MEDICAL HISTORY:  1. Type 2 diabetes.  2. Chronic left foot ulcer.  3. Sleep apnea.  4. History of CHF. 5. History of diverticulitis.  6. Benign hypertension.  7. Depression. 8. Status post hysterectomy. 9. Status post right hip  surgery.   SOCIAL HISTORY: She is a smoker, smoked 1-1/2 pack a day. Denies alcohol, but she uses marijuana every day. She lives with her son and has some difficulty to walk because of her ulcers.   FAMILY HISTORY: Positive for stroke, diabetes, hypertension, coronary artery disease.   HOME MEDICATIONS: 1. Tramadol 50 mg oral every 8 hours.  3. Propranolol 40 mg oral once a day.  4. Metformin 500 mg oral 2 times a day. 5. Glimepiride 600 mg 30 minutes before meals.  6. Furosemide 40 mg once a day.  7. Estradiol 1-mg tablet once a day. 8. Doxycycline 100 mg oral 2 times a day.  9. Diphenhydramine 25 mg oral 2 times a day. 10. Cymbalta 30 mg oral 3 capsules once a day. 11. Cyclobenzaprine 10 mg oral 3 times a day. 12. Aspirin 81 mg once a day.   PHYSICAL EXAMINATION:  VITAL SIGNS: Temperature 97.9, pulse is 90, respirations are 18, blood pressure 121/73, pulse oximetry is 96 on room air.  GENERAL: The patient is fully alert and oriented to time, place, and person. Does not appear in any acute distress.  HEENT: Head and neck atraumatic. Conjunctivae pink. Oral mucosa moist.  NECK: Supple. No JVD.  RESPIRATORY: Bilateral equal and clear air entry.  CARDIOVASCULAR: S1, S2 present, regular. No murmur.   ABDOMEN: Soft, nontender, bowel sounds present. No organomegaly.  SKIN: On left foot: There is ulcer and dressing present. Left toe, second toe is red and very tender to touch.  NEUROLOGICAL: Power 5/5. Follows commands. No tremor or rigidity. No numbness.  JOINTS: No swelling or tenderness, but there is some pain in the right  hip.  PSYCHIATRIC: Does not appear in any acute psychiatric illness.   IMPORTANT LABORATORY RESULTS: Glucose 224, BUN is 16, creatinine 0.88. Sodium is 137, potassium is 3.8, chloride is 102, CO2 is 28. Calcium is 9.2.   Total protein is 7.9. Albumin is 3.1. Bilirubin is 0.3, alkaline phosphatase 132. AST 14, ALT 16. WBC 12, hemoglobin 11, platelet count is 365,000,  MCV is 93.   ASSESSMENT AND PLAN: A 59 year old female with a past medical history of diabetes and chronic left foot ulcer, sleep apnea, congestive heart failure, diverticulitis, and hypertension, sent from Dr. Alvera Singh office to hospital for direct admission for infected foot ulcer and further management.  1. Infected diabetic foot ulcer on the left foot. We will start her on vancomycin and zosyn currently, and will do MRI to look for osteomyelitis. Dr. Vickki Muff to do further debridement as needed. Will get blood culture to rule out any streptococcus to blood. 2. Right hip pain. The patient had hip replacement surgery in the past and currently walking with an elevated boot for left foot. Will get an x-ray of the right hip to rule out any dislocation or fracture. If not than muscular pain, will give muscle relaxants.  3. Congestive heart failure. This is her history, but the patient does not have any decompensation at this time. Will just continue baseline medications.  3. Diabetes. We will keep her on sliding scale insulin coverage and will hold her metformin at this time because of the infection.  4. Hyperlipidemia. We will continue gemfibrozil. 5. Smoking. Smoking cessation counseling was done for 4 minutes and offered nicotine patch. She agreed to use it.   TOTAL TIME SPENT ON THIS ADMISSION: 50 minutes.    ____________________________ Ceasar Lund Anselm Jungling, MD vgv:lt D: 01/13/2014 15:29:17 ET T: 01/13/2014 15:58:40 ET JOB#: [id number]  cc: Ceasar Lund. Anselm Jungling, MD, <Dictator> Pete Glatter. Vickki Muff, DPM Vaughan Basta MD ELECTRONICALLY SIGNED 01/14/2014 11:15

## 2014-08-02 NOTE — Op Note (Signed)
PATIENT NAME:  Marie [name], Marie [name] MR#:  [id number] DATE OF BIRTH:  Feb 25, 1956  DATE OF PROCEDURE:  11/12/2013  PREOPERATIVE DIAGNOSIS: Left plantar foot abscess.  POSTOPERATIVE DIAGNOSIS:  Left plantar foot abscess.   PROCEDURE: Open incision and drainage abscess, plantar left foot.   SURGEON: Caedan Sumler A. Vickki Muff, DPM.   ANESTHESIA: LMA with local block.   HEMOSTASIS: Ankle tourniquet inflated to 250 mmHg for 7 minutes.   COMPLICATIONS: None.   SPECIMEN: None.   OPERATIVE INDICATIONS: This is a 59 year old female who has been admitted recently with worsening pain into her left foot. She was found to have an abscess to the plantar aspect of her left foot. MRI did not show any osteomyelitis and superficial abscess around the foot region. We have discussed surgical I and D and she presents today for surgery. All risks, benefits, alternatives, and complications associated with surgery were discussed with the patient and full informed consent has been given.   OPERATIVE PROCEDURE: The patient was brought into the OR and placed on the operating table in the supine position. General intubation was administered by the anesthesia team. An ankle block was placed around the left ankle. The left lower extremity was then prepped and draped in the usual sterile fashion. The tourniquet was inflated. A plantar incision was made along the 2nd ray. There was an ulcer plantar to the 2nd MTPJ. Full thickness incision was made down to the deep fascial layers. There were noted to be multiple punctate abscessed regions throughout the incision site approximately 4 inches in length. There were no areas of deep space abscess into the medial arch region. There were no interdigital abscessed areas. No areas probed around the dorsal aspect of the foot. The 2nd MTP joint was well-maintained. There was no open draining abscess from the joint itself.   At this time, the wound was then flushed with copious amounts of irrigation.  The surrounding edges were debrided away excisionally at the ulcerative site with the use of a VERSAJET. At this time, the skin was then closed with 3-0 nylon with the central ulcerative site left open for drainage. This was then packed with iodoform packing. A bulky sterile dressing was placed to the left foot. She tolerated the procedure and anesthesia well and was transported from the OR  to the PACU with all vital signs stable and neurovascular status intact. I will see her back on the floor. She will be nonweightbearing until the incision and wound has closed. Will see her outpatient upon discharge    ____________________________ Pete Glatter. Vickki Muff, DPM jaf:ds D: 11/12/2013 13:39:15 ET T: 11/12/2013 14:39:33 ET JOB#: [id number]  cc: Larkin Ina A. Vickki Muff, DPM, <Dictator> Jamilett Ferrante DPM ELECTRONICALLY SIGNED 12/17/2013 13:48

## 2014-08-02 NOTE — Consult Note (Signed)
Brief Consult Note: Diagnosis: Septic left 2nd toe with osteomyelitis.   Consult note dictated.   Orders entered.   Comments: Pt will need I & D with excision of MTPJ and debridment. NPO after midnight tonight. Plan for OR tomorrow.  Electronic Signatures: Samara Deist (MD)  (Signed 06-Oct-15 19:28)  Authored: Brief Consult Note   Last Updated: 06-Oct-15 19:28 by Samara Deist (MD)

## 2014-08-02 NOTE — Discharge Summary (Signed)
PATIENT NAME:  Marie [name], Marie [name] MR#:  [id number] DATE OF BIRTH:  1955-05-22  DATE OF ADMISSION:  01/23/2014 DATE OF DISCHARGE:  01/27/2014  PRIMARY CARE PHYSICIAN: Cletis Athens, MD  FINAL DIAGNOSES: 1.  Sepsis with hypotension.  This is line sepsis with Serratia.  2.  Acute respiratory failure. This is fluid overload with IV fluids while here.  3.  Osteomyelitis left foot.  4.  Diabetes.  5.  Hypertriglyceridemia.  6.  Depression.  7.  Anemia.   OPERATION DURING HOSPITAL COURSE: PICC line removal.   MEDICATIONS ON DISCHARGE: Include gemfibrozil 600 mg twice a day, furosemide 40 mg daily, aspirin 325 mg daily, metformin 500 mg twice a day, estradiol 1 mg daily, Cymbalta 30 mg 3 capsules daily, Flexeril 10 mg 3 times a day as needed, propranolol 40 mg twice a day, glipizide 5 mg twice a day, magnesium hydroxide 8% oral suspension 30 mL once a day at bedtime as needed for constipation, Lyrica 75 mg twice a day, MiraLAX 17 grams once a day as needed for constipation, senna 1 tablet twice a day as needed for constipation, Percocet 5/325 one tablet every 6 hours as needed for moderate pain, Benadryl 25 mg twice a day as needed for itching, clindamycin 300 mg every 8 hours for 14 more days, Cipro 500 mg twice a day for six more days, Xanax 0.25 mg at bedtime, ferrous sulfate 325 mg twice a day. Stop taking IV vancomycin.   FOLLOWUP:  Follow up with Dr. Vickki Muff podiatry 1 to 2 weeks, 1 to 2 weeks with Dr. Rebecka Apley.   The patient was admitted 01/23/2014 and discharged 01/27/2014. Came in with generalized body aches and cramping. Was admitted with hypotension and sepsis. The patient was given IV fluid hydration and started on IV vancomycin.   Laboratory and radiological data during the hospital course included white blood cell count 7.9, H and H of 12.2 and 38.7, platelet count of 409,000.  Magnesium 1.5, glucose 142, BUN 13, creatinine 0.83, sodium 138, potassium 4, chloride 104, CO2 of 20, alkaline  phosphatase 185, ALT 46, AST 70, total protein 8.7, albumin 3.5. Urinalysis negative.   Left foot x-ray post osteotomy involving the distal aspect of the second metatarsal and base of the proximal phalanx of the second digit with placement of antibiotic spacer device.   Lactic acid 1.8. One blood culture was negative. The other blood culture grew out Serratia, which is sensitive to Cipro. Hemoglobin after IV fluid hydration came down to 8.5. Creatinine upon discharge 0.72.   HOSPITAL COURSE:   1.  For the patient's sepsis with hypotension.  This is line sepsis with Serratia. The patient's PICC line was removed. Once the Serratia came back, Dr. Ola Spurr, infectious disease, switched the antibiotic over to Cipro orally and that will be given through 02/02/2014 to treat the line sepsis. A total of 10-day course. The patient had improved with blood pressure. Her blood pressure medications have been restarted.  2.  Acute respiratory failure. This was fluid overload from the IV fluids that were given during the hospital course. The patient initially became hypoxic and required oxygen up to 4 liters. I stopped the IV fluids and gave Lasix and that helped out. She is off the oxygen while here.  3.  Osteomyelitis of the left foot status post surgery by Dr. Vickki Muff in the past.  Was on IV vancomycin. We kept that going through the patient coming here Dr. Ola Spurr since the coagulase-negative Staphylococcus that was on the  culture last time and was comfortable switching the patient over to p.o. clindamycin for a completion of the 6-week therapy 2 more weeks of p.o. clindamycin given.  4.  Diabetes. Initially just on sliding scale, then we will restart the glipizide and Glucophage upon going home.  5.  Hypertriglyceridemia on gemfibrozil.  6.  Depression. The patient on Cymbalta.  7.  Anemia. The patient was likely dehydrated when she came in with the sepsis. Blood counts came down to 8.5 after IV fluid  hydration.  Will prescribe iron upon going home. Looking back at last hospitalization her hemoglobin was 9.8 at that time.   Time spent on discharge: 35 minutes    ____________________________ Tana Conch. Leslye Peer, MD rjw:jw D: 01/27/2014 15:57:30 ET T: 01/27/2014 16:48:19 ET JOB#: [id number]  cc: Tana Conch. Leslye Peer, MD, <Dictator> Justin A. Vickki Muff, DPM Cheral Marker. Ola Spurr, MD Cletis Athens, MD   Marisue Brooklyn MD ELECTRONICALLY SIGNED 01/30/2014 13:22

## 2014-08-02 NOTE — Consult Note (Signed)
Admit Diagnosis:   SYSTEMIC INFLAMMATORY RESPONSE SYNDROME.: Onset Date: 10-Nov-2013, Status: Active, Description: SYSTEMIC INFLAMMATORY RESPONSE SYNDROME.      Admit Reason:   Foot ulcer (707.15): Status: Active, Coding System: ICD9, Coded Name: Ulcer of other part of foot   Diabetes mellitus (250.00): Status: Active, Coding System: ICD9, Coded Name: Type II or unspecified type diabetes mellitus without mention of complication, not stated as uncontrolled   SIRS (systemic inflammatory response syndrome) (995.90): Status: Active, Coding System: ICD9, Coded Name: Systemic inflammatory response syndrome, unspecified   Cellulitis of foot (682.7): Status: Active, Coding System: ICD9, Coded Name: Cellulitis and abscess of foot, except toes      Chronic Problem:   Abdominal pain (789.00): Status: Active, Coding System: ICD9, Coded Name: Abdominal pain, unspecified site   Abdominal pain (789.00): Status: Active, Coding System: ICD9, Coded Name: Abdominal pain, unspecified site   Allergy (995.3): Status: Active, Coding System: ICD9, Coded Name: Allergy, unspecified not elsewhere classified   Abdominal pain (789.00): Status: Active, Coding System: ICD9, Coded Name: Abdominal pain, unspecified site    sleep apnea:    CHF:    cholilithasis:    diverticulitis:    depression:    HTN:    DM:    Hysterectomy - Partial:    Right Hip Replacement: 24-Jan-2012  Home Medications: Medication Instructions Status  propranolol 40 mg oral tablet 1 tab(s) orally 2 times a day  Active  diphenhydrAMINE 25 mg oral tablet 1 tab(s) orally 2 times a day, As Needed for numbness in lips Active  estradiol 1 mg oral tablet 1 tab(s) orally once a day (at bedtime) Active  furosemide 40 mg oral tablet 1 tab(s) orally once a day (in the morning) Active  gemfibrozil 600 mg oral tablet 1 tab(s) orally 30 minutes before breakfast and evening meal. Active  alprazolam 0.25 mg oral tablet 1 tab(s) orally 2  times a day Active  Aspirin Enteric Coated 325 mg oral delayed release tablet 1 tab(s) orally once a day Active  metFORMIN 500 mg oral tablet 1 tab(s) orally 2 times a day Active  polyethylene glycol 3350 - oral powder for reconstitution 1 capful (17 grams) in 8oz of fluid and drink orally once a day as needed for constipation Active  Cymbalta 60 mg oral delayed release capsule 1 cap(s) orally once a day (in the morning) Active  Cymbalta 30 mg oral delayed release capsule 1 tab(s) orally once a day (at bedtime) Active   Lab Results:  Routine Chem:  02-Aug-15 03:43   Glucose, Serum  226  BUN 13  Creatinine (comp) 0.92  Sodium, Serum 140  Potassium, Serum 3.8  Chloride, Serum  110  CO2, Serum 23  Calcium (Total), Serum  7.5  Anion Gap 7  Osmolality (calc) 287  eGFR (African American) >60  eGFR (Non-African American) >60 (eGFR values <9m/min/1.73 m2 may be an indication of chronic kidney disease (CKD). Calculated eGFR is useful in patients with stable renal function. The eGFR calculation will not be reliable in acutely ill patients when serum creatinine is changing rapidly. It is not useful in  patients on dialysis. The eGFR calculation may not be applicable to patients at the low and high extremes of body sizes, pregnant women, and vegetarians.)  Routine Hem:  02-Aug-15 03:43   WBC (CBC) 6.3  RBC (CBC)  2.97  Hemoglobin (CBC)  9.5  Hematocrit (CBC)  27.8  Platelet Count (CBC)  149  MCV 94  MCH 32.1  MCHC 34.3  RDW 13.6  Neutrophil % 69.4  Lymphocyte % 21.9  Monocyte % 6.8  Eosinophil % 1.4  Basophil % 0.5  Neutrophil # 4.4  Lymphocyte # 1.4  Monocyte # 0.4  Eosinophil # 0.1  Basophil # 0.0 (Result(s) reported on 10 Nov 2013 at 04:48AM.)   Radiology Results:  Radiology Results: XRay:    01-Aug-15 18:18, Foot Left Complete  Foot Left Complete  REASON FOR EXAM:    FOOT ULCER  COMMENTS:       PROCEDURE: DXR - DXR FOOT LT COMP W/OBLIQUES  - Nov 09 2013  6:18PM      CLINICAL DATA:  Left foot ulcer and fever.    EXAM:  LEFT FOOT - COMPLETE 3+ VIEW    COMPARISON:  09/30/2013.    FINDINGS:  There is continued is splaying of the second and third toes. There  is oval plantar soft tissue air between the second and third  proximal phalanges. Chronic subluxation at the second MTP joint is  again demonstrated. No bone destruction or periosteal reaction.  Dorsal tarsal spur formation is noted as well as mild inferior  calcaneal spur formation.     IMPRESSION:  Soft tissue ulceration between the second and third phalanges  without radiographic evidence of underlying osteomyelitis.      Electronically Signed    By: Enrique Sack M.D.    On: 11/09/2013 18:25       Verified By: Gerald Stabs, M.D.,    Ampicillin: Anaphylaxis  Tape: Rash  Neurontin: Alt Ment Status  Ultram: N/V/Diarrhea  Avelox: Muscles aches  Meloxicam: N/V/Diarrhea  Humalog KwikPen: Other  Levemir FlexPen: Other  Nursing Flowsheets: **Vital Signs.:   02-Aug-15 13:52  Temperature Temperature (F) 97.8    Present Illness Pt admitted with cellulitis, pain, n/v.  Has had longstanding hx of left foot ulcer seen at outside facility.  Noticed redness of leg and pt state she began feeling very sick last Tuesday (5d ago.)  Seen in ER yesterday and admitted for further evaluation.   Per pt has been seeing Dr. Paulla Dolly every 6-8 weeks for debridement and was planning on surgery in the future. Pt with noted neuropathy and chronic back pain.   Case History and Physical Exam:  Past Surgical History Multiple back surgeries   Primary Care Provider Dr. Jearld Pies [facility]   Cardiovascular Palpable dp and pt pulses b/l.   Musculoskeletal Noted hammertoes to left foot at 2nd and 3rd mtpj's.  Noted diffuse edema to left foot.  No acute issues with righ foot.   Neurological Grossly neuropathic.   Skin Full thickness deep ulcer to joint to plantar 2nd mtpj.  Surroung tissue is  focally edematous with superficial purulence with small punctate abscessed site with thick purulence.  Moderate surrounding erythema to foot.    Impression Diabetic ulcer with abscess, concerning for osteomyelitis left foot.   Plan Excisional debridement with 15 blade to joint capsule 1.5x1x1cms performed at bedside. Wound culuture obtained. Will order MRI of left foot.  Pt will likely need I & D in OR and possible bone debridement if osteomyelitis is seen.   Electronic Signatures: Samara Deist (MD)  (Signed 02-Aug-15 15:58)  Authored: Health Issues, Significant Events - History, Home Medications, Labs, Radiology Results, Allergies, Vital Signs, General Aspect/Present Illness, History and Physical Exam, Impression/Plan   Last Updated: 02-Aug-15 15:58 by Samara Deist (MD)

## 2014-08-02 NOTE — H&P (Signed)
PATIENT NAME:  Marie [name], Marie [name] MR#:  [id number] DATE OF BIRTH:  1955-11-01  DATE OF ADMISSION:  11/09/2013  REFERRING PHYSICIAN: Lisa Roca, M.D.   FAMILY PHYSICIAN:  Cletis Athens, MD.  REASON FOR ADMISSION: Left lower extremity cellulitis.   HISTORY OF PRESENT ILLNESS: The patient is a 59 year old female followed by Dr. Lavera Guise with diabetes. She has chronic left foot ulceration which has been treated by Dr. Paulla Dolly of podiatry, presents to Emergency Room today with nausea, vomiting, diarrhea and fever. In the Emergency Room, the patient was noted to have a left foot ulceration with progressive cellulitis, tracking up the left leg. She is now admitted for further evaluation. She is tachycardic and febrile on admission, consistent with systemic inflammatory response syndrome.   PAST MEDICAL HISTORY: 1.  Type 2 diabetes.  2.  Chronic left foot ulceration.  3.  Sleep apnea.  4.  History of CHF.  5.  History of diverticulitis.  6.  Benign hypertension.  7.  Depression.  8.  Status post hysterectomy.  9.  Status post right hip surgery.   MEDICATIONS: 1.  Zofran 4 mg p.o. t.i.d. p.r.n.  2.  Zanaflex 4 mg p.o. at bedtime.  3.  Propranolol 40 mg p.o. b.i.d.  4.  Norco 5/325 1-2 p.o. q. 6 h. p.r.n. pain.  5.  Metformin 1000 mg p.o. daily.  6.  Glipizide 10 mg p.o. daily.  7.  Lopid 600 mg p.o. b.i.d.  8.  Lasix 40 mg p.o. daily.  9.  Estrace 1 mg p.o. daily.  10.  Valium 5 mg p.o. at bedtime p.r.n.  11.  Celebrex 200 mg p.o. b.i.d.  12.  Aspirin 325 mg p.o. daily.   ALLERGIES: AMPICILLIN, NEURONTIN, AVELOX, ULTRAM, MELOXICAM, HUMALOG QUICK PEN, LEVEMIR FLEX PEN AND TAPE.   SOCIAL HISTORY: The patient is a smoker. Denies alcohol abuse.   FAMILY HISTORY: Positive for stroke, diabetes, hypertension, coronary artery disease.   REVIEW OF SYSTEMS: CONSTITUTIONAL: The patient has had fever but no change in weight.  EYES: No blurred or double vision. No glaucoma.  ENT: No tinnitus or  hearing loss. No nasal discharge or bleeding. No difficulty swallowing.  RESPIRATORY: Some cough. No wheezing or hemoptysis. No painful respiration.  CARDIOVASCULAR: No chest pain or orthopnea. No palpitations or syncope.  GASTROINTESTINAL: Nausea, vomiting and diarrhea as per HPI. No actual abdominal pain.  GENITOURINARY: No dysuria or hematuria. No incontinence.  ENDOCRINE: No polyuria or polydipsia. No heat or cold intolerance.  HEMATOLOGIC: The patient denies anemia, easy bruising, or bleeding.  LYMPHATIC: No swollen glands.  MUSCULOSKELETAL: The patient has pain in her legs, but denies neck, back, shoulder, or hip pain. No gout.  NEUROLOGIC: No numbness. Denies migraines, stroke or seizures.  PSYCHIATRIC: The patient denies anxiety, depression, although she does admit to occasional insomnia.   PHYSICAL EXAMINATION: GENERAL: The patient is in no acute distress.  VITAL SIGNS: Currently remarkable for a blood pressure of 102/59, heart rate 106, respiratory rate of 18, temperature 100.8, saturation 96% on room air.  HEENT: Normocephalic, atraumatic. Pupils are equal, round, and reactive to light and accommodation. Extraocular movements are intact. Sclerae anicteric. Conjunctivae are clear.  Oropharynx is clear  NECK: Supple without JVD. No adenopathy or thyromegaly is noted.  LUNGS: Scattered rhonchi, without wheezes or rales. No dullness. Respiratory effort is normal.   CARDIAC: Rapid rate with a regular rhythm. Normal S1, S2. No significant rubs, murmurs or gallops. PMI is nondisplaced. Chest wall is nontender.  ABDOMEN: Soft,  nontender, with normoactive bowel sounds. No organomegaly or masses were appreciated. No hernias or bruits were noted.  EXTREMITIES: Without edema. Distal pulses were 2+ bilaterally.  SKIN: Warm and dry. There was a large necrotic ulceration noted at the bottom of her left foot with erythema tracking up above the ankle on that side.  NEUROLOGIC: Cranial nerves II  through XII grossly intact. Deep tendon reflexes are symmetric. Motor exam nonfocal. Sensory exam revealed a stocking glove neuropathy.  PSYCHIATRIC:  Revealed a patient who is alert and oriented to person, place, and time. She was cooperative and used good judgment.   EKG revealed sinus tachycardia with no acute ischemic changes.   RADIOLOGY DATA:  Chest x-ray was unremarkable. Plain films of the left foot revealed soft tissue ulceration with no evidence of underlying osteomyelitis. Her lactic acid was 1.6. White count 9.8 with a hemoglobin of 12.0. Glucose 235, with a BUN of 16, creatinine 1.13, sodium of 132, and potassium of 3.0.   ASSESSMENT: 1.  Systemic inflammatory response syndrome manifested by fever, tachycardia, and hypotension.  2.  Diabetic foot ulcer.  3.  Progressive left lower extremity cellulitis.  4.  Type 2 diabetes.  5.  Hyponatremia.  6.  Hypokalemia.   PLAN: The patient will be admitted to the floor with IV fluids and IV antibiotics. Cultures have been sent. We will supplement her potassium. We will consult podiatry. Follow her sugars with Accu-Cheks before meals and at bedtime and add sliding scale insulin as needed. Follow up routine labs in the morning. Further treatment and evaluation will depend upon the patient's progress.   TOTAL TIME SPENT ON THIS PATIENT: 45 minutes.    ____________________________ Leonie Douglas Doy Hutching, MD jds:ds D: 11/09/2013 19:31:29 ET T: 11/09/2013 21:15:06 ET JOB#: [id number]  cc: Leonie Douglas. Doy Hutching, MD, <Dictator> Cletis Athens, MD Peral Lennice Sites MD ELECTRONICALLY SIGNED 11/10/2013 15:20

## 2014-08-02 NOTE — Consult Note (Signed)
PATIENT NAME:  Marie [name], Marie [name] MR#:  [id number] DATE OF BIRTH:  10/15/1955  DATE OF CONSULTATION:  01/14/2014  REFERRING PHYSICIAN:   CONSULTING PHYSICIAN:  Fatuma Dowers A. Vickki Muff, DPM  REASON FOR CONSULTATION: Left foot infection.   HISTORY OF PRESENT ILLNESS: This is a 59 year old female who I have seen in the outpatient clinic with an ulcer to her left foot. It was seen yesterday in the outpatient clinic, had noted worsening redness, swelling, and drainage to the plantar aspect of her left foot at her second toe joint plantarly. I recommended that she be admitted to the hospital for IV antibiotics and possible further workup and surgery. She underwent MRI today which showed an abscess with concern for osteomyelitis of her second toe joint on MRI. I was consulted for debridement and further evaluation of this wound today.   PAST MEDICAL HISTORY:  History of diabetes with neuropathy, chronic left foot ulceration, history of sleep apnea, history of CHF, history of hypertension.   HOME MEDICATIONS: Tramadol, propranolol, metformin, glimepiride, furosemide, estradiol, doxycycline, diphenhydramine, Cymbalta, cyclobenzaprine, aspirin.    ALLERGIES:   AMPICILLIN, AVELOX, HUMALOG, LEVEMIR, MELOXICAM, NEURONTIN, ULTRAM, AND TAPE.    SOCIAL HISTORY:  Positive history of smoking. Lives at home by herself, has an autistic son.    REVIEW OF SYSTEMS: Denies overt fever, but has had some recent ill feelings with mild nausea and lack of appetite. She has numbness to the lower extremities. No shortness of breath or chest pains.   PHYSICAL EXAMINATION:  GENERAL: She is alert and oriented.  VASCULAR: She has palpable DP and PT pulses to bilateral lower extremities.  NEUROLOGIC: She is completely neuropathic to the lower extremities.  DERMATOLOGY: Her left foot has noted diffuse erythema with a plantar second MTPJ Ulceration. There is a scant amount of serosanguinous and purulent drainage from the plantar second toe  joint. MUSCULOSKELETAL: She has a dislocated second toe hammertoe with noted diffuse edema to the left foot. Some restricted range of motion of the MTPJs on the left foot is noted.   RADIOLOGIC DATA:  MRI report shows a septic second toe joint with likely abscess just dorsal to this. There is an area of concern for osteomyelitis of the base of the proximal phalanx and head of the metatarsal.  Clinically this coincides with the ability to probe directly down to the second toe joint with noted drainage from the area.   LABORATORY DATA: Reveal an elevated white blood cell count of 12.0, that has decreased since hospitalized to 8.1. Blood cultures have been negative.   ASSESSMENT:  1.  Septic left second toe joint with likely osteomyelitis second toe joint.    2.  Diabetic neuropathy with diabetic foot ulcer.   PLAN: We have discussed surgical intervention consisting of an I and D with likely excision of the second MTPJ itself. She will need longterm IV antibiotics. We will plan on performing this tomorrow afternoon. She will be n.p.o. after breakfast tomorrow morning and can have a clear liquid breakfast tomorrow morning. The risks, benefits, alternatives, and complications associated with surgery were discussed with the patient and full consent has been verbally given. We will obtain written consent in the hospital.    ____________________________ Pete Glatter. Vickki Muff, DPM jaf:bu D: 01/14/2014 19:36:39 ET T: 01/14/2014 20:02:31 ET JOB#: [id number]  cc: Larkin Ina A. Vickki Muff, DPM, <Dictator> Andilyn Bettcher DPM ELECTRONICALLY SIGNED 01/23/2014 12:38

## 2014-08-02 NOTE — Discharge Summary (Signed)
PATIENT NAME:  Marie [name], Marie [name] MR#:  [id number] DATE OF BIRTH:  Sep 04, 1955  DATE OF ADMISSION:  11/09/2013 DATE OF DISCHARGE:  11/14/2013  PRIMARY CARE PROVIDER: Dr. Lavera Guise.   DISCHARGE DIAGNOSES:  1.  Left foot abscess with Enterococcus and Streptococcus agalactiae with chronic ulceration.  2.  Hyperlipidemia.  3.  Anxiety.  4.  Chronic diastolic congestive heart failure.  5.  Diabetes mellitus type 2.  6.  Chronic pain syndrome.   CONSULTS: Dr. Vickki Muff with Podiatry.   IMAGING STUDIES: X-ray of the foot which showed soft tissue ulceration. No osteomyelitis.   MRI of the left foot showed no osteomyelitis, just did show an abscess.   ADMITTING HISTORY AND PHYSICAL: Please see detailed H and P dictated previously with Dr. Doy Hutching. In brief, a 59 year old Caucasian female patient with history of diabetes, chronic left foot ulceration who presented to the hospital complaining of left foot extremity cellulitis. The patient was started on IV antibiotics, admitted to the hospitalist service.   HOSPITAL COURSE:  1.  Left foot cellulitis and abscess. Patient had initial debridement done. After which an MRI was done due to concern for osteomyelitis. Did not show any osteomyelitis, but did show an abscess  which was worsening.  She was taken to OR, had the abscess I and D. Cultures have grown Enterococcus and Streptococcus agalactiae. I consulted Dr. Ola Spurr over the telephone and, at this time, will discharge patient on Levaquin as the wound seems to be improving. She is afebrile, normal white count. The patient will need 3 times wound changes at home for which home health has been set up.  She will follow up with Dr. Vickki Muff with podiatry.   2.  For her uncontrolled diabetes, the patient's metformin has been continued.  She was on sliding scale insulin during the hospital stay, but I have started her on glipizide XL 2.5 mg and her blood sugars are much improved.   3.  Her chronic diastolic  congestive heart failure has been stable.  Propranolol has been stopped as the patient has low-normal blood pressures.   DISCHARGE PHYSICAL EXAMINATION:  EXTREMITIES:  Today on examination, the patient has dressing over her left foot.  LUNGS: Sound clear.  HEART: Heart sounds are S1, S2, without any murmurs.  NEUROLOGIC:  She is alert and oriented x 3.   DISCHARGE MEDICATIONS:  1.  Gemfibrozil 600 mg oral twice a day.  2.  Alprazolam 0.25 mg oral 2 times a day.  3.  Diphenhydramine 25 mg oral 2 times a day as needed for numbness.  4.  Estradiol 1 mg oral once a day.  5.  Lasix 40 mg daily.  6.  Aspirin 325 mg daily.  7.  Metformin 5 mg oral 2 times a day.  8.  MiraLax 17 grams oral once a day as needed.  9.  Cymbalta 60 mg in the morning and 30 mg at night.  10.  Acetaminophen/hydrocodone 325/5 one tablet 4 times a day as needed for pain.  11.  Glipizide 2.5 mg extended-release 1 tablet in the morning.  12.  Levaquin 5 mg oral once a day for 7 days.   DISCHARGE INSTRUCTIONS:  Patient will be on a low-salt, carbohydrate -controlled diet. Activity as tolerated.  Change wound dressings 3 times a day with saline and Kerlix. Follow up with Dr. Vickki Muff in a week. The patient has been advised to call her doctor and return to the Emergency Room if there is any worsening of the wound.  TIME SPENT ON DAY OF DISCHARGE:  Actually was 35 minutes.   ____________________________ Leia Alf Chigozie Basaldua, MD srs:ts D: 11/14/2013 14:20:10 ET T: 11/14/2013 19:17:08 ET JOB#: [id number]  cc: Alveta Heimlich R. Khalidah Herbold, MD, <Dictator> Cletis Athens, MD Pete Glatter. Vickki Muff, DPM Neita Carp MD ELECTRONICALLY SIGNED 11/25/2013 14:10

## 2014-08-02 NOTE — Consult Note (Signed)
PATIENT NAME:  Marie [name], SCHUSSLER MR#:  [id number] DATE OF BIRTH:  Aug 22, 1955  DATE OF CONSULTATION:  01/23/2014  REFERRING PHYSICIAN:   CONSULTING PHYSICIAN:  Sharlotte Alamo, DPM  REASON FOR CONSULTATION: This is a 59 year old female who was admitted earlier today with nausea, vomiting and diarrhea. Last night she woke up and started having severe muscle cramps after taking her vancomycin. There was concern and she called home health who  recommended she go to the emergency department where she was subsequently admitted. The patient has recently had surgery by Dr. Vickki Muff on her left foot for debridement of infected bone. Currently on IV antibiotics. She was supposed to be seen in the office this past week but she did miss her appointment per conversation with Dr. Vickki Muff.   PAST MEDICAL HISTORY: 1.  Diabetes on oral medication.  2.  Hypertension.  3.  Depression.  4.  Osteomyelitis with septic arthritis, left second metatarsophalangeal joint.  5.  Continued tobacco abuse.  6.  Hyperlipidemia. 7.  Congestive heart failure.  8.  History of diverticulitis.   PAST SURGICAL HISTORY: 1.  Hysterectomy.  2.  Right hip surgery.  3.  Debridement bone, left foot.   MEDICATIONS: 1.  Vancomycin IV piggyback.  2.  Senna 1 tablet 2 times a day.  3.  Propranolol 40 mg 2 times a day.  4.  Pregabalin 75 mg 2 times a day.  5.  MiraLAX 17 grams once a day.  6.  Metformin 500 mg twice daily.  7.  Magnesium hydroxide 30 mL once daily.  8.  Glipizide 5 mg 2 times a day.  9.  Gemfibrozil  600 mg every 30 minutes.  10.  Lasix 40 mg once daily.  11.  Fluconazole 100 mg daily. 12.  Estradiol 1 mg daily. 13.  Diphenhydramine 1 tablet 2 times a day.  14.  Cymbalta 90 mg daily.  15.  Cyclobenzaprine 10 mg daily.  16.  Aspirin 81 mg daily.  17.  Acetaminophen 1 every 4 to 6 hours p.r.n.   ALLERGIES:   1.  AMPICILLIN. 2.  NEURONTIN.  3.  AVELOX. 4.  ULTRAM. 5.  MELOXICAM. 6.  TAPE. 7.  HUMALOG. 8.   LEVEMIR.   FAMILY HISTORY: Significant for diabetes, hypertension, stroke, coronary artery disease.   SOCIAL HISTORY: Smokes 1 to 1 1/2 packs of cigarettes per day. She does relate marijuana use daily. Denies any alcohol or illicit drugs. Lives with her son.   REVIEW OF SYSTEMS: CONSTITUTIONAL: Does not relate any fever or chills. Does have some generalized weakness and cramping.  EYES: No blurry or double vision.  EARS, NOSE AND THROAT:  Denies any tinnitus or postnasal drip. RESPIRATORY:  Does have a cough. No wheezing.  CARDIOVASCULAR: No chest pain or arrhythmias.  GASTROINTESTINAL: Has had some nausea and vomiting. Also some diarrhea.  GENITOURINARY: Denies dysuria or hematuria.  HEMATOLOGIC:  No easy bruising or bleeding. SKIN:  Minimal drainage from the incision on her left foot. She has been changing her bandage every other day.  MUSCULOSKELETAL: Generalized muscle cramps. No arthritic pains.  NEUROLOGICAL: Denies any numbness or paresthesias.   PHYSICAL EXAMINATION: VASCULAR: DP and PT pulses are fully palpable. Capillary filling time intact.  NEUROLOGICAL: Sensation appears to be grossly intact bilateral.  INTEGUMENT: Skin is warm, dry and supple. There is a well-coapted incision on the dorsal aspect of the left foot over the second toe and metatarsal. Just a small amount of drainage is  noted from the  central portion of the incision. This appears to be mostly hemosanguineous. This is not uncommon with antibiotic beads that have been placed into the wound. No significant surrounding cellulitis is noted.  MUSCULOSKELETAL: Very guarded range of motion in the left foot with exquisite pain around the forefoot. Muscle testing is deferred.   X-RAYS: Multiple views of the left foot reveal resection of the head of the second metatarsal and base of the proximal phalanx of the second toe with implanted antibiotic beads. No signs of complication.   LABORATORY DATA: White count is in the  normal range at 7.9.   IMPRESSION: Good progress status post left foot surgery. Clinically does not appear to be a source of suspected sepsis.   PLAN: Betadine and a sterile bandage were applied to the foot as there was not one on the foot since this afternoon when it was evaluated. I discussed with the patient that her foot appears to be stable. I will speak with Dr. Vickki Muff and most likely we will go ahead and remove sutures during her hospital stay. We will still plan to see her outpatient in the clinic on a regular basis.    ____________________________ Sharlotte Alamo, DPM tc:jw D: 01/23/2014 19:20:08 ET T: 01/23/2014 20:11:33 ET JOB#: [id number]  cc: Sharlotte Alamo, DPM, <Dictator> Glade Strausser DPM ELECTRONICALLY SIGNED 02/03/2014 11:05

## 2014-08-03 NOTE — H&P (Signed)
PATIENT NAME:  Marie [name], Marie [name] MR#:  [id number] DATE OF BIRTH:  12-May-1955  DATE OF ADMISSION:  07/05/2011  CHIEF COMPLAINT: Shortness of breath.   HISTORY OF PRESENT ILLNESS: This is a 59 year old female who comes in with shortness of breath. She has been having leg cramps five to six hours at a time, mostly at night. Her legs get sore from cramping, 9/10 in intensity. Then she also developed chest pain on and off since Thursday, constant since last night, could not sleep. It is 4-6/10 in intensity. It stuns her and takes her breath away. She states that she is on the Zanaflex for these muscle cramps but did better with the Flexeril, but her insurance company changed it. Over the past couple of months she has been doing more than she normally does. She has been baby sitting and taking care of her grandson, but right now she is just in so much pain with her leg cramps she is unable to stand or walk at this point. Hospitalist services were contacted for further evaluation.   PAST MEDICAL HISTORY:  1. Congestive heart failure.  2. Diabetes.  3. Anxiety, depression.  4. Hypertriglyceridemia.  5. Gastroesophageal reflux disease.  6. Neuropathy.   PAST SURGICAL HISTORY:  1. Seven back surgeries. 2. Three cesarean sections.  3. Bilateral knee arthroscopies.   ALLERGIES: Ampicillin, Avelox, Neurontin, Ultram, and tape.  MEDICATIONS: 1. Advil 200 mg twice a day.  2. Xanax 0.25 mg twice a day.  3. Benadryl 25 mg p.r.n.  4. Cymbalta 90 mg daily.  5. Valium 5 mg at bedtime.  6. Estradiol 1 mg daily.  7. Lasix 40 mg daily.  8. Gemfibrozil 600 mg twice a day.  9. Nexium 40 mg daily.  10. MiraLAX 17 grams daily.  11. Promethazine p.r.n.  12. Propranolol 40 mg twice a day. 13. Tizanidine 4 mg daily.   SOCIAL HISTORY: Smokes one half to one pack per day. Does smoke some marijuana occasionally. Lives with her husband and son.   FAMILY HISTORY: Mother died of metastatic breast cancer and chronic  obstructive pulmonary disease. Father had a myocardial infarction, has diabetes and chronic obstructive pulmonary disease.   REVIEW OF SYSTEMS: CONSTITUTIONAL: Positive for fever. Positive for chills. Positive for sweats. Positive for weight loss of 201 to 195 over night. Positive for fatigue. EYES: Does wear glasses. EARS, NOSE, MOUTH, AND THROAT: Decreased hearing and ringing in the ears, runny nose, sore throat, dysphagia to solids. CARDIOVASCULAR: Positive for chest pain. Positive for palpitations. RESPIRATORY: Positive for shortness of breath. Positive for coughing up green phlegm. No hemoptysis. GASTROINTESTINAL: Positive for nausea. Positive for abdominal pain. Positive for constipation. Positive for blood in the bowel movements. GENITOURINARY: No burning on urination or hematuria. MUSCULOSKELETAL: Positive for back pain, knee pain, and joint pains. INTEGUMENT: Positive for itching. NEUROLOGIC: No fainting or blackouts. PSYCHIATRIC: Positive for anxiety and depression. ENDOCRINE: No thyroid problems. HEMATOLOGIC/LYMPHATIC: No anemia. No easy bruising or bleeding.   PHYSICAL EXAMINATION:  VITAL SIGNS: Temperature 96.1, pulse 82, respirations 21, blood pressure 142/86, pulse oximetry 97%.   GENERAL: No respiratory distress.   EYES: Conjunctivae and lids normal. Pupils equal, round, and reactive to light. Extraocular muscles intact. No nystagmus.   EARS, NOSE, MOUTH, AND THROAT: Tympanic membranes no erythema. Nasal mucosa no erythema. Throat no erythema. No exudate seen. Lips and gums no lesions.   NECK: No JVD. No bruits. No lymphadenopathy. No thyromegaly. No thyroid nodules palpated.   RESPIRATORY: Decreased breath sounds bilaterally.  Scattered wheeze throughout lung field. No rales or rhonchi heard. No use of accessory muscles to breathe.   HEART: S1, S2 normal. No gallops, rubs, or murmurs heard. Carotid upstroke 2+ bilaterally. No bruits.   EXTREMITIES: Dorsalis pedis pulses 2+  bilaterally.  No edema of the lower extremity.   ABDOMEN: Soft. Slight tenderness in the left upper quadrant. No organomegaly/splenomegaly. Normoactive bowel sounds. No masses felt.   MUSCULOSKELETAL: No clubbing, edema, or cyanosis.   SKIN: Scab on the left shin. Some redness on two hammertoes on the left foot. A small scab on the left first toe.   NEUROLOGIC: Cranial nerves II through XII grossly intact. Deep tendon reflexes 2+ bilateral lower extremities.   PSYCHIATRIC: The patient is oriented to person, place, and time.   LABORATORY, DIAGNOSTIC, AND RADIOLOGICAL DATA: Magnesium 1.9. Troponin negative. White blood cell count 11.7, hemoglobin and hematocrit 15.6 and 45.6, platelet count 282, glucose 229, BUN 20, creatinine 0.69, sodium 135, potassium 4.4, chloride 100, CO2 25, calcium 9.7, alkaline phosphatase 174, ALT 63, AST 79, total protein 9.  Chest x-ray: No acute changes. Troponin negative a second time and D-dimer negative.  EKG: Normal sinus rhythm, 94 beats per minute, nonspecific ST-T wave changes, flattening of T waves laterally.   ASSESSMENT AND PLAN:  1. Chest pain and shortness of breath: The patient states that she cannot go home. She would be right back here. I doubt this is cardiac with two enzymes being negative and constant chest pain. We will get a third enzyme and put on telemetry. We will get a CT scan of the chest to rule out pulmonary embolism but I think this is less likely in the differential. I will give Solu-Medrol in case this is musculoskeletal related to her chronic back issues.  2. Leg cramps: Most likely chronic in nature. We will switch back to Flexeril and continue the Valium. We will hold off on pain medications if possible.  3. Anxiety and depression: Continue Cymbalta and Xanax.  4. Hypertension: The patient is on propranolol.  5. Gastroesophageal reflux disease: Continue Nexium.  6. Diabetes: The patient stopped her medications. We will put on sliding  scale and check a hemoglobin A1c.  7. History of congestive heart failure, BUN slightly up: We will hold Lasix in the a.m. and go from there. 8. Tobacco abuse: Smoking cessation counseling done for three minutes by me. Nicotine patch applied.  9. Increased liver function tests: We will repeat in the morning with hepatitis profiles. Can consider an ultrasound of the abdomen.  10. Increased total protein: We will send off a serum protein electrophoresis.    TIME SPENT ON ADMISSION: 55 minutes.   ____________________________ Tana Conch. Leslye Peer, MD rjw:bjt D: 07/05/2011 20:08:16 ET T: 07/06/2011 07:19:53 ET JOB#: [id number]  cc: Tana Conch. Leslye Peer, MD, <Dictator> Marisue Brooklyn MD ELECTRONICALLY SIGNED 07/06/2011 21:01

## 2014-08-04 DIAGNOSIS — F330 Major depressive disorder, recurrent, mild: Secondary | ICD-10-CM | POA: Diagnosis not present

## 2014-08-04 DIAGNOSIS — J449 Chronic obstructive pulmonary disease, unspecified: Secondary | ICD-10-CM | POA: Diagnosis not present

## 2014-08-04 DIAGNOSIS — R0602 Shortness of breath: Secondary | ICD-10-CM | POA: Diagnosis not present

## 2014-08-04 DIAGNOSIS — F17210 Nicotine dependence, cigarettes, uncomplicated: Secondary | ICD-10-CM | POA: Diagnosis not present

## 2014-08-10 NOTE — Consult Note (Signed)
PATIENT NAME:  Marie [name], DOUSE MR#:  [id number] DATE OF BIRTH:  12-29-1955  DATE OF CONSULTATION:  04/29/2014  REFERRING PHYSICIAN:   CONSULTING PHYSICIAN:  Reeda Soohoo R. Kimala Horne, MD  REQUESTING PHYSICIAN: Dr. Lenise Arena in the Emergency Room.   REASON FOR CONSULTATION: Chest pain, right lung nodule.   HISTORY OF PRESENT ILLNESS: A 59 year old Caucasian female patient with history of tobacco abuse, COPD, diabetes mellitus, hypertension, depression, chronic pain syndrome, presents to the Emergency Room complaining of 2 days of chest and back pain. She mentions that this happens every time she takes a deep breath or coughs. Radiating to both her shoulders. She has also had green productive sputum with some wheezing, mild shortness of breath. The patient also complains of left breast nodule. She continues to smoke in spite of coughing and shortness of breath. She has also had chronic low back pain which is unchanged. No lower extremity swelling.   No orthopnea, PND, edema. No sick contacts. No fever.   PAST MEDICAL HISTORY:  1. Left second toe osteomyelitis and septic arthritis.  2. Diabetes mellitus.  3. Hypertension.  4. Depression.  5. Chronic back pain.  6. Tobacco abuse.  7. Hyperlipidemia.  8. Diastolic congestive heart failure.  9. History of diverticulitis.   PAST SURGICAL HISTORY: Hysterectomy, status post right hip surgery.   ALLERGIES: AMPICILLIN, NEURONTIN, AVELOX, ULTRAM. MELOXICAM, TAPE, HUMALOG, LEVEMIR.   SOCIAL HISTORY: The patient continues to smoke a pack a day. Does not drink any alcohol. No illicit drug use. Uses marijuana occasionally. Lives with her son.   FAMILY HISTORY: Positive for stroke, diabetes, hypertension, coronary artery disease.   REVIEW OF SYSTEMS:  CONSTITUTIONAL: Complains of some fatigue.  EYES: No blurred vision, pain or redness.  EARS, NOSE, AND THROAT: No tinnitus, ear pain, hearing loss.  RESPIRATORY: Has cough, wheeze, green sputum.   CARDIOVASCULAR: Denies chest pain.  GASTROINTESTINAL: No nausea, vomiting, diarrhea, abdominal pain.  GENITOURINARY:  No dysuria, hematuria, frequency.  ENDOCRINE: No polyuria, nocturia, thyroid problems.  HEMATOLOGIC AND LYMPHATIC: No anemia, easy bruising, or bleeding.   INTEGUMENTARY:  No acne, rash, lesions.  MUSCULOSKELETAL: Has chronic back pain.  NEUROLOGIC: No focal numbness, weakness.  PSYCHIATRIC: No anxiety or depression.   HOME MEDICATIONS:  1. Alprazolam 0.5 mg oral once a day.  2. Aspirin 325 mg daily.  3. Flexeril 1 tablet oral 3 times a day as needed.  4. Cymbalta 30 mg 3 capsules daily.  5. Diphenhydramine 1 tablet oral 2 times a day as needed.  6. Estradiol apply topically to affected area daily.  7. Lasix 40 mg daily.  8. Gemfibrozil 600 mg 2 times a day.  9. Glipizide 5 mg oral 2 times a day.  10. Metformin 500 mg oral 2 times a day.  11. MiraLax 17 grams oral once a day as needed.  12. Propranolol 40 mg 2 times a day.  13. Senna oral 2 times a day as needed for constipation.   PHYSICAL EXAMINATION:  VITAL SIGNS: Temperature of 98, pulse of 71, blood pressure 121/63, saturating 98% on room air.  GENERAL: Obese Caucasian female patient lying in bed, overall seems comfortable, conversational, cooperative with exam.  PSYCHIATRIC: Alert and oriented x 3, pleasant.  HEENT: Normocephalic. Oral mucosa moist and pink. External ears and nose normal. No pallor. No icterus. Pupils bilaterally equal and reactive to light.  NECK: Supple. No thyromegaly. No palpable lymph nodes. Trachea midline. No carotid bruit or JVD.  CARDIOVASCULAR: S1, S2, without any murmurs. Peripheral  pulses 2 +. No edema.  RESPIRATORY: Normal work of breathing, has bilateral wheezing, good air entry.  CHEST WALL:  Has chest wall tenderness on palpation.  BREASTS:  No lumps, masses found. No discharge. No axillary lymphadenopathy.  GASTROINTESTINAL: Soft abdomen, nontender. Bowel sounds present. No  organomegaly palpable.  SKIN:  Warm and dry. No petechiae, rash, ulcers.  MUSCULOSKELETAL: No joint swelling, redness, effusion of the large joints. Normal muscle tone.  NEUROLOGICAL: Motor strength 5 out of 5 in upper and lower extremities. Sensation is intact all over.   LABORATORY STUDIES: Glucose of 305. BNP of 151. BUN 14, creatinine 0.74, sodium 136, potassium 3.9, chloride 105. AST, ALT, alkaline phosphatase mildly elevated, which is chronic, bilirubin normal. Troponin less than 0.02. WBC 8.8, hemoglobin 12.9, platelets of 305,000. INR 0.9.   CTA of the chest with contrast shows enlarging 14 mm spiculated superior segment right lower lobe pulmonary nodule suggesting primary bronchogenic carcinoma, no evidence of metastatic disease. Negative for PE, atherosclerosis including CAD, no evidence of pulmonary edema or infiltrates.   ASSESSMENT AND PLAN:  1.  Acute bronchitis with pleuritic chest pain in a patient who continues to smoke. Presently the patient is afebrile, normal white count, is not needing any oxygen, good air entry on examination. Can be started on antibiotics, inhalers along with steroids and can be discharged home. Case discussed with Dr. Jimmye Norman of the Emergency Room who will be discharging the patient home. The patient's EKG is normal. Troponin is normal.  2.  Right lower lobe spiculated 14 mm lung nodule. This is highly suspicious for bronchogenic carcinoma considering the patient's long history of smoking. I have stressed the importance of  close followup. The patient has been made referral to pulmonology from the Emergency Room, which I requested the patient to keep. She likely will need a biopsy of this nodule.  3.  Breast nodule. The patient complained of a left breast nodule which is not palpable, but she did have mammogram last year as per patient which showed some changes. She will follow up with her primary care physician for the same.  4.  Tobacco abuse. The patient has  been counseled to quit smoking for greater than 3 minutes.  5.  Congestive heart failure, stable. Continue Lasix.  6.  Chronic pain syndrome, stable.  7.  Diabetes mellitus, uncontrolled. The patient's blood sugars are elevated at 300. She mentioned that her blood sugars normally run in 150s consistently. This is likely secondary from her bronchitis or dietary discretion. She can continue her home medications.   Thank you for the consult. Please call back with any questions.     ____________________________ Leia Alf Janely Gullickson, MD srs:bu D: 04/29/2014 15:08:32 ET T: 04/29/2014 15:23:51 ET JOB#: [id number]  cc: Alveta Heimlich R. Jairy Angulo, MD, <Dictator> Cletis Athens, MD Neita Carp MD ELECTRONICALLY SIGNED 05/07/2014 16:53

## 2014-08-12 ENCOUNTER — Telehealth: Payer: Self-pay | Admitting: *Deleted

## 2014-08-12 NOTE — Telephone Encounter (Signed)
Patient came to the clinic today to see a nurse to discuss her pulmonary clearance. She states that Dr. Humphrey Rolls could not clear her last week due to "not having all Dr. Soyla Dryer notes and records." RN contacted cancer center medical records, who will make sure that Dr. Humphrey Rolls receives the records. Also placed call to Dr. Devona Konig. Left msg on nursing line with Dr. Humphrey Rolls to see if patient has obtained medical clearance.  Pending that pt gets pulmonary clearance, then surgery can be arranged.  Consents were signed on 4/14 with Dr. Genevive Bi. Will contact pt as soon as we get this information.

## 2014-08-13 NOTE — Telephone Encounter (Signed)
Contacted Marie [name] at Dr. Laurelyn Sickle office.  She received the pulmonary function studies and Dr. Soyla Dryer note yesterday.  Dr. Humphrey Rolls is out of the office until tom. Morning.  She will get the PA or another nurse to call the cancer center tom. I explained to Marie [name] that the surgery has been posted for 08/26/14.  I also spoke with patient. I gave her an update on the pulmonary clearance with Dr. Humphrey Rolls and my conversation with Vickie.  Pt instructed that her surgery date will be 08/26/14 and her preop will be on 08/19/14 at 1245pm.

## 2014-08-19 ENCOUNTER — Encounter
Admission: RE | Admit: 2014-08-19 | Discharge: 2014-08-19 | Disposition: A | Discharge status: Home / Self Care | Payer: Medicare Other | Source: Ambulatory Visit | Attending: Cardiothoracic Surgery | Admitting: Cardiothoracic Surgery

## 2014-08-19 DIAGNOSIS — I10 Essential (primary) hypertension: Secondary | ICD-10-CM | POA: Diagnosis not present

## 2014-08-19 DIAGNOSIS — E119 Type 2 diabetes mellitus without complications: Secondary | ICD-10-CM | POA: Insufficient documentation

## 2014-08-19 DIAGNOSIS — I739 Peripheral vascular disease, unspecified: Secondary | ICD-10-CM | POA: Diagnosis not present

## 2014-08-19 DIAGNOSIS — M1990 Unspecified osteoarthritis, unspecified site: Secondary | ICD-10-CM | POA: Diagnosis not present

## 2014-08-19 DIAGNOSIS — Z01812 Encounter for preprocedural laboratory examination: Secondary | ICD-10-CM | POA: Diagnosis not present

## 2014-08-19 DIAGNOSIS — J449 Chronic obstructive pulmonary disease, unspecified: Secondary | ICD-10-CM | POA: Diagnosis not present

## 2014-08-19 DIAGNOSIS — C3490 Malignant neoplasm of unspecified part of unspecified bronchus or lung: Secondary | ICD-10-CM | POA: Diagnosis not present

## 2014-08-19 LAB — ABO/RH: ABO/RH(D): B POS

## 2014-08-19 NOTE — Patient Instructions (Signed)
  Your procedure is scheduled on: Tuesday Aug 26, 2014 Report to Day Surgery. To find out your arrival time please call [phone] between 1PM - 3PM on Monday Aug 25, 2014.  Remember: Instructions that are not followed completely may result in serious medical risk, up to and including death, or upon the discretion of your surgeon and anesthesiologist your surgery may need to be rescheduled.    __x__ 1. Do not eat food or drink liquids after midnight. No gum chewing or hard candies.     __x__ 2. No Alcohol for 24 hours before or after surgery.   __x__ 3. Bring all medications with you on the day of surgery if instructed.    __x__ 4. Notify your doctor if there is any change in your medical condition     (cold, fever, infections).     Do not wear jewelry, make-up, hairpins, clips or nail polish.  Do not wear lotions, powders, or perfumes. You may wear deodorant.  Do not shave 48 hours prior to surgery. Men may shave face and neck.  Do not bring valuables to the hospital.    [facility] is not responsible for any belongings or valuables.               Contacts, dentures or bridgework may not be worn into surgery.  Leave your suitcase in the car. After surgery it may be brought to your room.  For patients admitted to the hospital, discharge time is determined by your                treatment team.   Patients discharged the day of surgery will not be allowed to drive home.   Please read over the following fact sheets that you were given:   Surgical Site Infection Prevention   __x__ Take these medicines the morning of surgery with A SIP OF WATER:    1. Wellburin  2. Propranolol  3. Gemfibrozil  4. Duloxetine  5.  6.  ____ Fleet Enema (as directed)   _x___ Use CHG Soap as directed  ____ Use inhalers on the day of surgery  ____ Stop metformin 2 days prior to surgery    __x__ Take 1/2 of usual insulin dose the night before surgery and none on the morning of surgery. No  insulin the morning of surgery.  ____ Stop Coumadin/Plavix/aspirin on   ____ Stop Anti-inflammatories on    ____ Stop supplements until after surgery.    ____ Bring C-Pap to the hospital.

## 2014-08-20 NOTE — Telephone Encounter (Signed)
Pulmonary clearance note obtained from Dr. Humphrey Rolls. Note given to Dr. Genevive Bi

## 2014-08-21 ENCOUNTER — Other Ambulatory Visit: Payer: Self-pay | Admitting: Cardiothoracic Surgery

## 2014-08-26 ENCOUNTER — Inpatient Hospital Stay: Payer: Medicare Other | Admitting: Certified Registered Nurse Anesthetist

## 2014-08-26 ENCOUNTER — Encounter
Admission: RE | Disposition: A | Discharge status: Home / Self Care | Payer: Self-pay | Source: Ambulatory Visit | Attending: Cardiothoracic Surgery

## 2014-08-26 ENCOUNTER — Encounter: Payer: Self-pay | Admitting: *Deleted

## 2014-08-26 ENCOUNTER — Inpatient Hospital Stay
Admission: RE | Admit: 2014-08-26 | Discharge: 2014-09-02 | DRG: 166 | Disposition: A | Discharge status: Home / Self Care | Payer: Medicare Other | Source: Ambulatory Visit | Attending: Cardiothoracic Surgery | Admitting: Cardiothoracic Surgery

## 2014-08-26 ENCOUNTER — Inpatient Hospital Stay: Payer: Medicare Other

## 2014-08-26 DIAGNOSIS — J9811 Atelectasis: Secondary | ICD-10-CM | POA: Diagnosis not present

## 2014-08-26 DIAGNOSIS — Z888 Allergy status to other drugs, medicaments and biological substances status: Secondary | ICD-10-CM

## 2014-08-26 DIAGNOSIS — G8918 Other acute postprocedural pain: Secondary | ICD-10-CM | POA: Diagnosis not present

## 2014-08-26 DIAGNOSIS — F17210 Nicotine dependence, cigarettes, uncomplicated: Secondary | ICD-10-CM | POA: Diagnosis present

## 2014-08-26 DIAGNOSIS — Z794 Long term (current) use of insulin: Secondary | ICD-10-CM | POA: Diagnosis not present

## 2014-08-26 DIAGNOSIS — C3431 Malignant neoplasm of lower lobe, right bronchus or lung: Secondary | ICD-10-CM | POA: Diagnosis not present

## 2014-08-26 DIAGNOSIS — I952 Hypotension due to drugs: Secondary | ICD-10-CM | POA: Diagnosis not present

## 2014-08-26 DIAGNOSIS — I739 Peripheral vascular disease, unspecified: Secondary | ICD-10-CM | POA: Diagnosis present

## 2014-08-26 DIAGNOSIS — R4182 Altered mental status, unspecified: Secondary | ICD-10-CM | POA: Diagnosis not present

## 2014-08-26 DIAGNOSIS — T40605A Adverse effect of unspecified narcotics, initial encounter: Secondary | ICD-10-CM | POA: Diagnosis not present

## 2014-08-26 DIAGNOSIS — Z79891 Long term (current) use of opiate analgesic: Secondary | ICD-10-CM | POA: Diagnosis not present

## 2014-08-26 DIAGNOSIS — I10 Essential (primary) hypertension: Secondary | ICD-10-CM | POA: Diagnosis not present

## 2014-08-26 DIAGNOSIS — E876 Hypokalemia: Secondary | ICD-10-CM | POA: Diagnosis not present

## 2014-08-26 DIAGNOSIS — C3490 Malignant neoplasm of unspecified part of unspecified bronchus or lung: Secondary | ICD-10-CM | POA: Diagnosis not present

## 2014-08-26 DIAGNOSIS — F329 Major depressive disorder, single episode, unspecified: Secondary | ICD-10-CM | POA: Diagnosis present

## 2014-08-26 DIAGNOSIS — J939 Pneumothorax, unspecified: Secondary | ICD-10-CM | POA: Diagnosis not present

## 2014-08-26 DIAGNOSIS — J9602 Acute respiratory failure with hypercapnia: Secondary | ICD-10-CM | POA: Diagnosis not present

## 2014-08-26 DIAGNOSIS — Y92239 Unspecified place in hospital as the place of occurrence of the external cause: Secondary | ICD-10-CM

## 2014-08-26 DIAGNOSIS — J9601 Acute respiratory failure with hypoxia: Secondary | ICD-10-CM | POA: Diagnosis not present

## 2014-08-26 DIAGNOSIS — R918 Other nonspecific abnormal finding of lung field: Secondary | ICD-10-CM | POA: Diagnosis not present

## 2014-08-26 DIAGNOSIS — R079 Chest pain, unspecified: Secondary | ICD-10-CM | POA: Diagnosis not present

## 2014-08-26 DIAGNOSIS — Z4682 Encounter for fitting and adjustment of non-vascular catheter: Secondary | ICD-10-CM | POA: Diagnosis not present

## 2014-08-26 DIAGNOSIS — Z483 Aftercare following surgery for neoplasm: Secondary | ICD-10-CM | POA: Diagnosis not present

## 2014-08-26 DIAGNOSIS — Z885 Allergy status to narcotic agent status: Secondary | ICD-10-CM | POA: Diagnosis not present

## 2014-08-26 DIAGNOSIS — Z85118 Personal history of other malignant neoplasm of bronchus and lung: Secondary | ICD-10-CM | POA: Diagnosis not present

## 2014-08-26 DIAGNOSIS — J439 Emphysema, unspecified: Secondary | ICD-10-CM | POA: Diagnosis present

## 2014-08-26 DIAGNOSIS — L8990 Pressure ulcer of unspecified site, unspecified stage: Secondary | ICD-10-CM | POA: Diagnosis present

## 2014-08-26 DIAGNOSIS — Z79899 Other long term (current) drug therapy: Secondary | ICD-10-CM

## 2014-08-26 DIAGNOSIS — Z719 Counseling, unspecified: Secondary | ICD-10-CM | POA: Diagnosis not present

## 2014-08-26 DIAGNOSIS — E11622 Type 2 diabetes mellitus with other skin ulcer: Secondary | ICD-10-CM | POA: Diagnosis present

## 2014-08-26 DIAGNOSIS — M1990 Unspecified osteoarthritis, unspecified site: Secondary | ICD-10-CM | POA: Diagnosis not present

## 2014-08-26 DIAGNOSIS — Z880 Allergy status to penicillin: Secondary | ICD-10-CM | POA: Diagnosis not present

## 2014-08-26 DIAGNOSIS — R0602 Shortness of breath: Secondary | ICD-10-CM

## 2014-08-26 DIAGNOSIS — R400 Somnolence: Secondary | ICD-10-CM | POA: Diagnosis not present

## 2014-08-26 DIAGNOSIS — Z789 Other specified health status: Secondary | ICD-10-CM | POA: Diagnosis not present

## 2014-08-26 DIAGNOSIS — E119 Type 2 diabetes mellitus without complications: Secondary | ICD-10-CM | POA: Diagnosis not present

## 2014-08-26 DIAGNOSIS — J449 Chronic obstructive pulmonary disease, unspecified: Secondary | ICD-10-CM | POA: Diagnosis not present

## 2014-08-26 DIAGNOSIS — R0902 Hypoxemia: Secondary | ICD-10-CM | POA: Diagnosis not present

## 2014-08-26 DIAGNOSIS — R911 Solitary pulmonary nodule: Secondary | ICD-10-CM | POA: Diagnosis not present

## 2014-08-26 HISTORY — PX: VIDEO ASSISTED THORACOSCOPY (VATS)/THOROCOTOMY: SHX6173

## 2014-08-26 LAB — GLUCOSE, CAPILLARY
GLUCOSE-CAPILLARY: 205 mg/dL — AB (ref 65–99)
Glucose-Capillary: 205 mg/dL — ABNORMAL HIGH (ref 65–99)

## 2014-08-26 LAB — PREPARE RBC (CROSSMATCH)

## 2014-08-26 SURGERY — VIDEO ASSISTED THORACOSCOPY (VATS)/THOROCOTOMY
Anesthesia: General | Laterality: Right

## 2014-08-26 MED ORDER — ALBUTEROL SULFATE (2.5 MG/3ML) 0.083% IN NEBU
2.5000 mg | INHALATION_SOLUTION | RESPIRATORY_TRACT | Status: DC
  Administered 2014-08-26 – 2014-08-30 (×18): 2.5 mg via RESPIRATORY_TRACT
  Filled 2014-08-26 (×15): qty 3

## 2014-08-26 MED ORDER — EPHEDRINE 5 MG/ML INJ
5.0000 mg | INTRAVENOUS | Status: DC | PRN
  Administered 2014-08-26 (×2): 5 mg via INTRAVENOUS

## 2014-08-26 MED ORDER — FAMOTIDINE 20 MG PO TABS
ORAL_TABLET | ORAL | Status: AC
  Administered 2014-08-26: 20 mg via ORAL
  Filled 2014-08-26: qty 1

## 2014-08-26 MED ORDER — PROPRANOLOL HCL 10 MG PO TABS
40.0000 mg | ORAL_TABLET | Freq: Two times a day (BID) | ORAL | Status: DC
  Administered 2014-08-27 – 2014-08-29 (×5): 40 mg via ORAL
  Filled 2014-08-26: qty 4
  Filled 2014-08-26: qty 2
  Filled 2014-08-26: qty 4
  Filled 2014-08-26 (×3): qty 2

## 2014-08-26 MED ORDER — FENTANYL 2.5 MCG/ML W/ROPIVACAINE 0.2% IN NS 100 ML EPIDURAL INFUSION (ARMC-ANES)
8.0000 mL/h | EPIDURAL | Status: DC
  Administered 2014-08-26: 8 mL/h via EPIDURAL
  Filled 2014-08-26: qty 100

## 2014-08-26 MED ORDER — ROCURONIUM BROMIDE 100 MG/10ML IV SOLN
INTRAVENOUS | Status: DC | PRN
  Administered 2014-08-26: 10 mg via INTRAVENOUS
  Administered 2014-08-26: 20 mg via INTRAVENOUS
  Administered 2014-08-26: 40 mg via INTRAVENOUS

## 2014-08-26 MED ORDER — ONDANSETRON HCL 4 MG/2ML IJ SOLN
INTRAMUSCULAR | Status: DC | PRN
  Administered 2014-08-26: 4 mg via INTRAVENOUS

## 2014-08-26 MED ORDER — FENTANYL CITRATE (PF) 100 MCG/2ML IJ SOLN
25.0000 ug | INTRAMUSCULAR | Status: DC | PRN
  Administered 2014-08-26 (×4): 25 ug via INTRAVENOUS

## 2014-08-26 MED ORDER — STERILE WATER FOR IRRIGATION IR SOLN
Status: DC | PRN
  Administered 2014-08-26: 200 mL

## 2014-08-26 MED ORDER — PHENYLEPHRINE 40 MCG/ML (10ML) SYRINGE FOR IV PUSH (FOR BLOOD PRESSURE SUPPORT)
80.0000 ug | PREFILLED_SYRINGE | INTRAVENOUS | Status: DC | PRN
  Filled 2014-08-26: qty 2

## 2014-08-26 MED ORDER — ONDANSETRON HCL 4 MG/2ML IJ SOLN: 4.0000 mg | Freq: Once | INTRAMUSCULAR | Status: DC | PRN

## 2014-08-26 MED ORDER — PROPOFOL 10 MG/ML IV BOLUS
INTRAVENOUS | Status: DC | PRN
  Administered 2014-08-26: 180 mg via INTRAVENOUS

## 2014-08-26 MED ORDER — BUPIVACAINE HCL 0.25 % IJ SOLN
INTRAMUSCULAR | Status: DC | PRN
  Administered 2014-08-26: 20 mL

## 2014-08-26 MED ORDER — BUPIVACAINE HCL (PF) 0.25 % IJ SOLN
INTRAMUSCULAR | Status: AC
  Administered 2014-08-26: 5 mL via EPIDURAL
  Filled 2014-08-26: qty 30

## 2014-08-26 MED ORDER — EPHEDRINE SULFATE 50 MG/ML IJ SOLN
INTRAMUSCULAR | Status: AC
  Administered 2014-08-26: 5 mg
  Filled 2014-08-26: qty 1

## 2014-08-26 MED ORDER — LIDOCAINE HCL (CARDIAC) 20 MG/ML IV SOLN
INTRAVENOUS | Status: DC | PRN
  Administered 2014-08-26: 80 mg via INTRAVENOUS

## 2014-08-26 MED ORDER — MINERAL OIL LIGHT 100 % EX OIL
TOPICAL_OIL | CUTANEOUS | Status: AC
  Filled 2014-08-26: qty 25

## 2014-08-26 MED ORDER — FENTANYL CITRATE (PF) 100 MCG/2ML IJ SOLN
INTRAMUSCULAR | Status: AC
  Administered 2014-08-26: 25 ug via INTRAVENOUS
  Filled 2014-08-26: qty 2

## 2014-08-26 MED ORDER — HYDROCODONE-ACETAMINOPHEN 5-325 MG PO TABS
1.0000 | ORAL_TABLET | Freq: Four times a day (QID) | ORAL | Status: DC | PRN
  Administered 2014-08-26 – 2014-08-27 (×3): 2 via ORAL
  Filled 2014-08-26 (×4): qty 2

## 2014-08-26 MED ORDER — NEOSTIGMINE METHYLSULFATE 10 MG/10ML IV SOLN
INTRAVENOUS | Status: DC | PRN
  Administered 2014-08-26: 5 mg via INTRAVENOUS

## 2014-08-26 MED ORDER — DEXAMETHASONE SODIUM PHOSPHATE 10 MG/ML IJ SOLN
INTRAMUSCULAR | Status: DC | PRN
  Administered 2014-08-26: 5 mg via INTRAVENOUS

## 2014-08-26 MED ORDER — BUPIVACAINE HCL (PF) 0.25 % IJ SOLN
INTRAMUSCULAR | Status: DC | PRN
  Administered 2014-08-26 (×2): 4 mL

## 2014-08-26 MED ORDER — HYDROMORPHONE HCL 1 MG/ML IJ SOLN
0.2500 mg | INTRAMUSCULAR | Status: DC | PRN
  Administered 2014-08-26 (×2): 0.25 mg via INTRAVENOUS

## 2014-08-26 MED ORDER — BISACODYL 5 MG PO TBEC
10.0000 mg | DELAYED_RELEASE_TABLET | Freq: Every day | ORAL | Status: DC
Start: 2014-08-26 — End: 2014-09-02
  Administered 2014-08-26 – 2014-09-02 (×8): 10 mg via ORAL
  Filled 2014-08-26 (×8): qty 2

## 2014-08-26 MED ORDER — MORPHINE SULFATE 2 MG/ML IJ SOLN
INTRAMUSCULAR | Status: AC
  Administered 2014-08-26: 2 mg via INTRAVENOUS
  Filled 2014-08-26: qty 1

## 2014-08-26 MED ORDER — SODIUM CHLORIDE 0.9 % IV SOLN
INTRAVENOUS | Status: DC
  Administered 2014-08-26 (×2): via INTRAVENOUS

## 2014-08-26 MED ORDER — VANCOMYCIN HCL IN DEXTROSE 1-5 GM/200ML-% IV SOLN
1000.0000 mg | Freq: Once | INTRAVENOUS | Status: AC
  Administered 2014-08-26: 1000 mg via INTRAVENOUS

## 2014-08-26 MED ORDER — BUPIVACAINE HCL (PF) 0.25 % IJ SOLN
5.0000 mL | Freq: Once | INTRAMUSCULAR | Status: AC | PRN
  Administered 2014-08-26: 5 mL via EPIDURAL

## 2014-08-26 MED ORDER — CETYLPYRIDINIUM CHLORIDE 0.05 % MT LIQD
7.0000 mL | Freq: Two times a day (BID) | OROMUCOSAL | Status: DC
  Administered 2014-08-26 – 2014-08-31 (×10): 7 mL via OROMUCOSAL

## 2014-08-26 MED ORDER — ACETAMINOPHEN 10 MG/ML IV SOLN
INTRAVENOUS | Status: AC
  Filled 2014-08-26: qty 100

## 2014-08-26 MED ORDER — GLYCOPYRROLATE 0.2 MG/ML IJ SOLN
INTRAMUSCULAR | Status: DC | PRN
  Administered 2014-08-26: 0.6 mg via INTRAVENOUS

## 2014-08-26 MED ORDER — VANCOMYCIN HCL IN DEXTROSE 1-5 GM/200ML-% IV SOLN
INTRAVENOUS | Status: AC
  Filled 2014-08-26: qty 200

## 2014-08-26 MED ORDER — SODIUM CHLORIDE 0.9 % IJ SOLN
INTRAMUSCULAR | Status: AC
  Filled 2014-08-26: qty 10

## 2014-08-26 MED ORDER — SODIUM CHLORIDE 0.9 % IV BOLUS (SEPSIS)
250.0000 mL | Freq: Once | INTRAVENOUS | Status: AC
  Administered 2014-08-26: 250 mL via INTRAVENOUS

## 2014-08-26 MED ORDER — SODIUM CHLORIDE 0.9 % IV SOLN
500.0000 mg | Freq: Two times a day (BID) | INTRAVENOUS | Status: AC
  Administered 2014-08-26 – 2014-08-27 (×2): 500 mg via INTRAVENOUS
  Filled 2014-08-26 (×2): qty 500

## 2014-08-26 MED ORDER — DEXTROSE-NACL 5-0.45 % IV SOLN
INTRAVENOUS | Status: DC
  Administered 2014-08-26 – 2014-08-27 (×3): via INTRAVENOUS

## 2014-08-26 MED ORDER — ONDANSETRON HCL 4 MG/2ML IJ SOLN
4.0000 mg | Freq: Four times a day (QID) | INTRAMUSCULAR | Status: DC | PRN
  Administered 2014-08-26 – 2014-08-29 (×5): 4 mg via INTRAVENOUS
  Filled 2014-08-26 (×6): qty 2

## 2014-08-26 MED ORDER — DIPHENHYDRAMINE HCL 50 MG/ML IJ SOLN: 12.5000 mg | INTRAMUSCULAR | Status: DC | PRN

## 2014-08-26 MED ORDER — ACETAMINOPHEN 10 MG/ML IV SOLN
INTRAVENOUS | Status: DC | PRN
  Administered 2014-08-26: 1000 mg via INTRAVENOUS

## 2014-08-26 MED ORDER — FUROSEMIDE 40 MG PO TABS
40.0000 mg | ORAL_TABLET | Freq: Every day | ORAL | Status: DC
  Administered 2014-08-26 – 2014-08-31 (×6): 40 mg via ORAL
  Filled 2014-08-26 (×6): qty 1

## 2014-08-26 MED ORDER — SUCCINYLCHOLINE CHLORIDE 20 MG/ML IJ SOLN
INTRAMUSCULAR | Status: DC | PRN
  Administered 2014-08-26: 100 mg via INTRAVENOUS

## 2014-08-26 MED ORDER — BUPIVACAINE HCL (PF) 0.25 % IJ SOLN
INTRAMUSCULAR | Status: AC
  Filled 2014-08-26: qty 30

## 2014-08-26 MED ORDER — FAMOTIDINE 20 MG PO TABS
20.0000 mg | ORAL_TABLET | Freq: Once | ORAL | Status: AC
  Administered 2014-08-26: 20 mg via ORAL

## 2014-08-26 MED ORDER — MORPHINE SULFATE 2 MG/ML IJ SOLN
1.0000 mg | INTRAMUSCULAR | Status: DC | PRN
  Administered 2014-08-26 – 2014-09-01 (×37): 2 mg via INTRAVENOUS
  Filled 2014-08-26 (×36): qty 1

## 2014-08-26 MED ORDER — PHENYLEPHRINE HCL 10 MG/ML IJ SOLN
INTRAMUSCULAR | Status: DC | PRN
  Administered 2014-08-26 (×5): 100 ug via INTRAVENOUS

## 2014-08-26 MED ORDER — HYDROMORPHONE HCL 1 MG/ML IJ SOLN
INTRAMUSCULAR | Status: AC
  Administered 2014-08-26: 0.25 mg via INTRAVENOUS
  Filled 2014-08-26: qty 1

## 2014-08-26 MED ORDER — ONDANSETRON 4 MG PO TBDP
4.0000 mg | ORAL_TABLET | Freq: Three times a day (TID) | ORAL | Status: DC | PRN
  Administered 2014-08-28 – 2014-09-02 (×3): 4 mg via ORAL
  Filled 2014-08-26 (×3): qty 1

## 2014-08-26 MED ORDER — MIDAZOLAM HCL 5 MG/5ML IJ SOLN
INTRAMUSCULAR | Status: DC | PRN
  Administered 2014-08-26: 2 mg via INTRAVENOUS

## 2014-08-26 MED ORDER — GEMFIBROZIL 600 MG PO TABS
600.0000 mg | ORAL_TABLET | Freq: Two times a day (BID) | ORAL | Status: DC
  Administered 2014-08-26 – 2014-09-02 (×14): 600 mg via ORAL
  Filled 2014-08-26 (×17): qty 1

## 2014-08-26 MED ORDER — EPHEDRINE 5 MG/ML INJ
10.0000 mg | INTRAVENOUS | Status: DC | PRN
  Filled 2014-08-26: qty 2

## 2014-08-26 MED ORDER — EPHEDRINE SULFATE 50 MG/ML IJ SOLN
INTRAMUSCULAR | Status: DC | PRN
  Administered 2014-08-26: 4 mg via INTRAVENOUS
  Administered 2014-08-26 (×2): 10 mg via INTRAVENOUS
  Administered 2014-08-26: 15 mg via INTRAVENOUS
  Administered 2014-08-26 (×2): 10 mg via INTRAVENOUS

## 2014-08-26 MED ORDER — DULOXETINE HCL 60 MG PO CPEP
90.0000 mg | ORAL_CAPSULE | Freq: Every day | ORAL | Status: DC
  Administered 2014-08-27 – 2014-09-02 (×7): 90 mg via ORAL
  Filled 2014-08-26 (×7): qty 1

## 2014-08-26 MED ORDER — FENTANYL CITRATE (PF) 250 MCG/5ML IJ SOLN
INTRAMUSCULAR | Status: DC | PRN
  Administered 2014-08-26 (×2): 50 ug via INTRAVENOUS
  Administered 2014-08-26: 100 ug via INTRAVENOUS

## 2014-08-26 SURGICAL SUPPLY — 64 items
BNDG COHESIVE 4X5 TAN STRL (GAUZE/BANDAGES/DRESSINGS) ×1 IMPLANT
BRONCHOSCOPE PED SLIM DISP (MISCELLANEOUS) ×2 IMPLANT
CANISTER SUCT 1200ML W/VALVE (MISCELLANEOUS) ×2 IMPLANT
CAP SURGEON UNV BLUE SMS DISP (MISCELLANEOUS) ×4 IMPLANT
CATH THORACIC 32FR RT (CATHETERS) ×1 IMPLANT
CATH TRAY 16F METER LATEX (MISCELLANEOUS) ×2 IMPLANT
CATH URET ROBINSON 16FR STRL (CATHETERS) ×2 IMPLANT
CHLORAPREP W/TINT 26ML (MISCELLANEOUS) ×4 IMPLANT
CNTNR SPEC 2.5X3XGRAD LEK (MISCELLANEOUS) ×1
CONT SPEC 4OZ STER OR WHT (MISCELLANEOUS) ×1
CONT SPEC 4OZ STRL OR WHT (MISCELLANEOUS) ×1
CONTAINER SPEC 2.5X3XGRAD LEK (MISCELLANEOUS) ×4 IMPLANT
CUTTER ECHEON FLEX ENDO 45 340 (ENDOMECHANICALS) ×2 IMPLANT
DRAIN CHEST DRY SUCT SGL (MISCELLANEOUS) ×2 IMPLANT
DRAPE C-SECTION (MISCELLANEOUS) ×2 IMPLANT
DRAPE MAG INST 16X20 L/F (DRAPES) ×2 IMPLANT
DRSG TEGADERM 2-3/8X2-3/4 SM (GAUZE/BANDAGES/DRESSINGS) ×2 IMPLANT
DRSG TEGADERM 6X8 (GAUZE/BANDAGES/DRESSINGS) ×2 IMPLANT
DRSG TELFA 3X8 NADH (GAUZE/BANDAGES/DRESSINGS) ×2 IMPLANT
ELECT BLADE 6.5 EXT (BLADE) ×2 IMPLANT
ELECT CAUTERY BLADE TIP 2.5 (TIP) ×2
ELECTRODE CAUTERY BLDE TIP 2.5 (TIP) ×1 IMPLANT
GAUZE SPONGE 4X4 12PLY STRL (GAUZE/BANDAGES/DRESSINGS) ×2 IMPLANT
GLOVE EXAM LX STRL 7.5 (GLOVE) ×8 IMPLANT
GOWN STRL REUS W/ TWL LRG LVL3 (GOWN DISPOSABLE) ×3 IMPLANT
GOWN STRL REUS W/TWL LRG LVL3 (GOWN DISPOSABLE) ×6
HANDLE YANKAUER SUCT BULB TIP (MISCELLANEOUS) ×2 IMPLANT
KIT RM TURNOVER STRD PROC AR (KITS) ×2 IMPLANT
LABEL OR SOLS (LABEL) ×2 IMPLANT
LOOP RED MAXI  1X406MM (MISCELLANEOUS) ×1
LOOP VESSEL MAXI 1X406 RED (MISCELLANEOUS) ×1 IMPLANT
MARKER SKIN W/RULER 31145785 (MISCELLANEOUS) ×2 IMPLANT
NDL HYPO 25X1 1.5 SAFETY (NEEDLE) IMPLANT
NEEDLE HYPO 25X1 1.5 SAFETY (NEEDLE) ×2 IMPLANT
PACK BASIN MAJOR ARMC (MISCELLANEOUS) ×2 IMPLANT
PAD DRESSING TELFA 3X8 NADH (GAUZE/BANDAGES/DRESSINGS) ×1 IMPLANT
PAD GROUND ADULT SPLIT (MISCELLANEOUS) ×2 IMPLANT
PENCIL ELECTRO HAND CTR (MISCELLANEOUS) ×1 IMPLANT
RELOAD GOLD ECHELON 45 (STAPLE) ×5 IMPLANT
RELOAD STAPLER LINEAR PROX 30 (STAPLE) IMPLANT
SEALANT PROGEL (MISCELLANEOUS) ×1 IMPLANT
SOL ANTI-FOG 6CC FOG-OUT (MISCELLANEOUS) IMPLANT
SOL FOG-OUT ANTI-FOG 6CC (MISCELLANEOUS) ×1
SPONGE KITTNER 5P (MISCELLANEOUS) ×3 IMPLANT
STAPLE ECHEON FLEX 60 POW ENDO (STAPLE) ×1 IMPLANT
STAPLER RELOAD LINEAR PROX 30 (STAPLE)
STAPLER RELOADABLE 30 BLU REG (STAPLE) ×1 IMPLANT
STAPLER SKIN PROX 35W (STAPLE) ×2 IMPLANT
STRIP CLOSURE SKIN 1/2X4 (GAUZE/BANDAGES/DRESSINGS) ×2 IMPLANT
SUT SILK 0 (SUTURE) ×2
SUT SILK 0 30XBRD TIE 6 (SUTURE) ×1 IMPLANT
SUT SILK 1 SH (SUTURE) ×12 IMPLANT
SUT VIC AB 0 CT1 36 (SUTURE) ×6 IMPLANT
SUT VIC AB 2-0 CT1 27 (SUTURE) ×4
SUT VIC AB 2-0 CT1 TAPERPNT 27 (SUTURE) ×4 IMPLANT
SUT VICRYL 2 TP 1 (SUTURE) ×9 IMPLANT
SYR 10ML SLIP (SYRINGE) ×2 IMPLANT
SYR BULB IRRIG 60ML STRL (SYRINGE) ×2 IMPLANT
SYRINGE 10CC LL (SYRINGE) ×1 IMPLANT
TAPE TRANSPORE STRL 2 31045 (GAUZE/BANDAGES/DRESSINGS) ×2 IMPLANT
TROCAR FLEXIPATH 20X80 (ENDOMECHANICALS) ×2 IMPLANT
TROCAR FLEXIPATH THORACIC 15MM (ENDOMECHANICALS) ×2 IMPLANT
TUBING CONNECTING 10 (TUBING) ×2 IMPLANT
WATER STERILE IRR 1000ML POUR (IV SOLUTION) ×2 IMPLANT

## 2014-08-26 NOTE — Progress Notes (Signed)
Date: 08/26/2014,   MRN# [id number] CARLIN MAMONE [birth year] Code Status:     Code Status Orders        Start     Ordered   08/26/14 1254  Full code   Continuous     08/26/14 1253     Hosp day:'@LENGTHOFSTAYDAYS'$ @ Referring MD: '@ATDPROV'$ @     PULMONOLOGIST:  Dr. Laurelyn Sickle        CC: post op icu care  HPI: This is a 59 year old lady s/p  VIDEO ASSISTED THORACOSCOPY (VATS)/THOROCOTOMY (Right) and Preoperative bronchoscopy for pulmonary nodule (1.4 cm). Awake, off vent, mentation is good. No respiratory distress. Pulmonary pre op clearance per Dr. Humphrey Rolls.   PMHX:   Past Medical History  Diagnosis Date  . Diabetes mellitus without complication   . Arthritis   . Hypertension   . PAD (peripheral artery disease)   . COPD (chronic obstructive pulmonary disease)    Surgical Hx:  Past Surgical History  Procedure Laterality Date  . Abdominal hysterectomy    . Cesarean section      x3  . Foot surgery Left     diabetic ulcer  . Carpal tunnel release Bilateral   . Knee arthroscopy Bilateral   . Tonsillectomy    . Back surgery  x 7   Family Hx:  No family history on file. Social Hx:   History  Substance Use Topics  . Smoking status: Current Every Day Smoker -- 0.50 packs/day    Types: Cigarettes  . Smokeless tobacco: Never Used  . Alcohol Use: No   Medication:    Home Medication:  No current outpatient prescriptions on file.  Current Medication: '@CURMEDTAB'$ @   Allergies:  Penicillins; Avelox; Vicodin; and Neurontin  Review of Systems: Gen:  Denies  fever, sweats, chills HEENT: Denies blurred vision, double vision, ear pain, eye pain, hearing loss, nose bleeds, sore throat Cvc:  No dizziness, chest pain or heaviness Resp:   Incisional chest pain Gi: Denies swallowing difficulty, stomach pain, nausea or vomiting, diarrhea, constipation, bowel incontinence Gu:  Denies bladder incontinence, burning urine Ext:   No Joint pain, stiffness or swelling Skin: No skin rash,  easy bruising or bleeding or hives Endoc:  No polyuria, polydipsia , polyphagia or weight change Psych: No depression, insomnia or hallucinations  Other:  All other systems negative  Physical Examination:   VS: BP 100/54 mmHg  Pulse 90  Temp(Src) 98.9 F (37.2 C) (Axillary)  Resp 9  Ht '5\' 5"'$  (1.651 m)  Wt 210 lb 15.7 oz (95.7 kg)  BMI 35.11 kg/m2  SpO2 99%  General Appearance: No distress  PSYCH: Without focal findings, mental status, speech normal, alert and oriented NEURO: Cranial nerves 2-12 intact, reflexes normal and symmetric, sensation grossly normal  HEENT: PERRLA, EOM intact, no ptosis, no other lesions noticed,  NECK: Supple, no stridor Pulmonary:.No wheezing, No rales  Sputum Production:   Cardiovascular:  Normal S1,S2.  No m/r/g.  Abdominal aorta pulsation normal.    Abdomen:Benign, Soft, non-tender, No masses, hepatosplenomegaly, No lymphadenopathy Endoc: No evident thyromegaly, no signs of acromegaly or Cushing features Skin:   warm, no rashes, no ecchymosis  Extremities: normal, no cyanosis, clubbing, no edema, warm with normal capillary refill. Other findings:   Labs results:    Rad results:  CLINICAL DATA: History of lung cancer. Status post surgery.  EXAM: PORTABLE CHEST - 1 VIEW  COMPARISON: 07/09/2014 PET-CT  FINDINGS: There is a right-sided chest tube directed towards the apex. There is no  pneumothorax. There is no pleural effusion. There is focal right lower lung airspace opacity which may reflect alveolar hemorrhage secondary to postsurgical changes versus focal atelectasis. There is no other focal parenchymal opacity. There is no left pleural effusion or pneumothorax.  Stable cardiomediastinal silhouette.  No acute osseous abnormality.  IMPRESSION: There is a right-sided chest tube directed towards the apex. There is no pneumothorax.  There is focal right lower lung airspace opacity which may reflect alveolar hemorrhage  secondary to postsurgical changes versus focal atelectasis.  Assessment and Plan: Status post right pulm nodule resection via vats. So far doing reasonable well. She has an epidural Post op care as directed by t -surg Am cxr Pulmonary toilet Dr. Rich Number back tomorrow   I have personally obtained a history, examined the patient, evaluated laboratory and imaging results, formulated the assessment and plan and placed orders.  The Patient requires high complexity decision making for assessment and support, frequent evaluation and titration of therapies, application of advanced monitoring technologies and extensive interpretation of multiple databases.   Juelz Claar,M.D. Pulmonary & Critical care Medicine [facility]

## 2014-08-26 NOTE — Consult Note (Signed)
Pulmonary Critical Care  Initial Consult Note   JEZLYN WESTERFIELD ZOX:[id number] DOB: [birth year] DOA: 08/26/2014  Referring physician: Graciela Husbands, MD PCP: Cletis Athens, MD   Chief Complaint: Lung Mass Post Op  HPI: TIFINI REEDER is a 59 y.o. female with history of an enlarging lung mass presented for surgery and is currently post-op after a VATS. Patient has had the procedure and is now off the ventilator with complaints of pain at the surgical site. She has no shortness of breath. She has not been coughing. She has no fever noted. Patient has no fever noted. She had been noted as an outpatient to have a enlarging lung mass and after evaluation was deemed to be OK for surgery.   Review of Systems:   ROS performed and is unremarkable other than noted in HPI  Past Medical History  Diagnosis Date  . Diabetes mellitus without complication   . Arthritis   . Hypertension   . PAD (peripheral artery disease)   . COPD (chronic obstructive pulmonary disease)    Past Surgical History  Procedure Laterality Date  . Abdominal hysterectomy    . Cesarean section      x3  . Foot surgery Left     diabetic ulcer  . Carpal tunnel release Bilateral   . Knee arthroscopy Bilateral   . Tonsillectomy    . Back surgery  x 7   Social History:  reports that she has been smoking Cigarettes.  She has been smoking about 0.50 packs per day. She has never used smokeless tobacco. She reports that she does not drink alcohol or use illicit drugs.  Allergies  Allergen Reactions  . Penicillins Anaphylaxis and Shortness Of Breath  . Avelox [Moxifloxacin Hcl In Nacl] Other (See Comments)    MUSCLE CRAMPS  . Vicodin [Hydrocodone-Acetaminophen] Nausea Only  . Neurontin [Gabapentin] Anxiety    No family history on file.  Prior to Admission medications   Medication Sig Start Date End Date Taking? Authorizing Provider  ALPRAZolam (XANAX) 0.25 MG tablet Take 0.25 mg by mouth. 1 pill twice daily   Yes  Historical Provider, MD  budesonide-formoterol (SYMBICORT) 160-4.5 MCG/ACT inhaler Inhale 2 puffs into the lungs 2 (two) times daily.   Yes Historical Provider, MD  buPROPion (WELLBUTRIN XL) 150 MG 24 hr tablet Take 150 mg by mouth daily.   Yes Historical Provider, MD  clindamycin (CLEOCIN) 300 MG capsule  09/30/13  Yes Historical Provider, MD  cyclobenzaprine (FLEXERIL) 10 MG tablet Take 10 mg by mouth 3 (three) times daily.    Yes Historical Provider, MD  diazepam (VALIUM) 5 MG tablet Take 5 mg by mouth at bedtime.    Yes Historical Provider, MD  diphenhydrAMINE (BENADRYL) 25 MG tablet Take 25 mg by mouth every 6 (six) hours as needed for allergies.   Yes Historical Provider, MD  DULoxetine (CYMBALTA) 60 MG capsule Take 60 mg by mouth daily.   Yes Historical Provider, MD  estradiol (ESTRACE) 1 MG tablet Take 1 mg by mouth daily.   Yes Historical Provider, MD  furosemide (LASIX) 40 MG tablet Take 40 mg by mouth daily.   Yes Historical Provider, MD  gemfibrozil (LOPID) 600 MG tablet Take 600 mg by mouth 2 (two) times daily before a meal.   Yes Historical Provider, MD  glipiZIDE (GLUCOTROL) 5 MG tablet Take 5 mg by mouth 2 (two) times daily before a meal.   Yes Historical Provider, MD  HYDROcodone-acetaminophen (NORCO/VICODIN) 5-325 MG per tablet Take 1 tablet by  mouth every 8 (eight) hours as needed for moderate pain.   Yes Historical Provider, MD  Insulin Glargine 300 UNIT/ML SOPN Inject 50 Units into the skin every morning.   Yes Historical Provider, MD  metFORMIN (GLUCOPHAGE) 500 MG tablet Take 500 mg by mouth 2 (two) times daily with a meal.   Yes Historical Provider, MD  ondansetron (ZOFRAN-ODT) 4 MG disintegrating tablet  09/30/13  Yes Historical Provider, MD  tiotropium (SPIRIVA) 18 MCG inhalation capsule Place 18 mcg into inhaler and inhale daily.   Yes Historical Provider, MD  zolpidem (AMBIEN) 10 MG tablet Take 10 mg by mouth at bedtime.   Yes Historical Provider, MD  propranolol (INDERAL)  40 MG tablet Take 40 mg by mouth 2 (two) times daily.    Historical Provider, MD   Physical Exam: Filed Vitals:   08/26/14 2000 08/26/14 2030 08/26/14 2052 08/26/14 2100  BP: 102/70 107/65  100/54  Pulse: 97 89  90  Temp:      TempSrc:      Resp: '13 12  9  '$ Height:      Weight:      SpO2: 97% 95% 97% 99%    Wt Readings from Last 3 Encounters:  08/26/14 95.7 kg (210 lb 15.7 oz)  06/21/13 86.183 kg (190 lb)  05/17/13 84.369 kg (186 lb)    General:  Appears calm and comfortable c/o pain in surgical site Eyes: PERRL, normal lids, irises & conjunctiva ENT: grossly normal hearing, lips & tongue Neck: no LAD, masses or thyromegaly Cardiovascular: RRR, no m/r/g. No LE edema. Respiratory: CTA bilaterally Normal respiratory effort. Abdomen: soft, nontender Psychiatric: grossly normal but anxious Neurologic: grossly non-focal.          Labs on Admission:  Basic Metabolic Panel: No results for input(s): NA, K, CL, CO2, GLUCOSE, BUN, CREATININE, CALCIUM, MG, PHOS in the last 168 hours. Liver Function Tests: No results for input(s): AST, ALT, ALKPHOS, BILITOT, PROT, ALBUMIN in the last 168 hours. No results for input(s): LIPASE, AMYLASE in the last 168 hours. No results for input(s): AMMONIA in the last 168 hours. CBC: No results for input(s): WBC, NEUTROABS, HGB, HCT, MCV, PLT in the last 168 hours. Cardiac Enzymes: No results for input(s): CKTOTAL, CKMB, CKMBINDEX, TROPONINI in the last 168 hours.  BNP (last 3 results) No results for input(s): BNP in the last 8760 hours.  ProBNP (last 3 results) No results for input(s): PROBNP in the last 8760 hours.  CBG:  Recent Labs Lab 08/26/14 0620 08/26/14 1240  GLUCAP 205* 205*    Radiological Exams on Admission: Dg Chest Port 1 View  08/26/2014   CLINICAL DATA:  History of lung cancer.  Status post surgery.  EXAM: PORTABLE CHEST - 1 VIEW  COMPARISON:  07/09/2014 PET-CT  FINDINGS: There is a right-sided chest tube directed  towards the apex. There is no pneumothorax. There is no pleural effusion. There is focal right lower lung airspace opacity which may reflect alveolar hemorrhage secondary to postsurgical changes versus focal atelectasis. There is no other focal parenchymal opacity. There is no left pleural effusion or pneumothorax.  Stable cardiomediastinal silhouette.  No acute osseous abnormality.  IMPRESSION: There is a right-sided chest tube directed towards the apex. There is no pneumothorax.  There is focal right lower lung airspace opacity which may reflect alveolar hemorrhage secondary to postsurgical changes versus focal atelectasis.   Electronically Signed   By: Kathreen Devoid   On: 08/26/2014 11:35      Assessment/Plan Active  Problems:   Lung cancer   1. Lung Mass suggestive of lung cancer -patient has undergone resection with VATS on the right side -post op she was complaining of severe pain I spoke to the Nursing staff and they have obtained orders from Dr Faith Rogue for morphine -will monitor for pulmonary decompensation currently she is doing well  Code Status: Full Code  DVT Prophylaxis:SCD Family Communication: daughter present in room (indicate person spoken with, if applicable, with phone number if by telephone) Disposition Plan: Home   Time spent: Critical Care 54mn    I have personally obtained a history, examined the patient, evaluated laboratory and imaging results, formulated the assessment and plan and placed orders.  The Patient requires high complexity decision making for assessment and support.    SAllyne Gee MD FMorrill County Community HospitalPulmonary Critical Care Medicine Sleep Medicine
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
## 2014-08-26 NOTE — H&P (Signed)
   History reviewed, patient examined, no change in status, stable for surgery.  

## 2014-08-26 NOTE — Op Note (Signed)
08/26/2014  10:29 AM  PATIENT:  Marie [name]  59 y.o. female  PRE-OPERATIVE DIAGNOSIS:  lung nodule  POST-OPERATIVE DIAGNOSIS:  same  PROCEDURE:  Procedure(s): VIDEO ASSISTED THORACOSCOPY (VATS)/THOROCOTOMY (Right)  Preoperative bronchoscopy  SURGEON:  Surgeon(s) and Role:    * Nestor Lewandowsky, MD - Primary    * Marlyce Huge, MD - Assisting  ASSISTANTS: Chauncey Reading   ANESTHESIA:   general  DICTATION: This patient is a 59 year old woman who presented with an enlarging right lower lobe mass. The characteristics of the mass were consistent with an adenocarcinoma the lung and after an extensive evaluation by myself and her primary pulmonologist she was found to be a suitable candidate for surgery. The patient desired a wedge resection if at all possible and she was apprised of the indications and risks of surgery including risks of bleeding infection air leak and death. The advantages and disadvantages of various treatment options were explained the patient preoperatively.  Patient brought to the operating suite and placed in supine position. General endotracheal anesthesia was given with a double-lumen tube. Preoperative bronchoscopy was carried out and was normal to the subsegmental level bilaterally. Patient was then turned for a right thoracotomy.  The patient was prepped and draped in usual sterile fashion. A posterolateral thoracotomy was performed. The latissimus muscle was divided but the serratus muscle was spared. Fifth interspace was entered. An access port was created inferiorly for our camera and for the stapler. Within the right lower lobe near the superior segment of more in the basilar segment was located a 1.4 cm mass which was easily palpable. Using a combination of multiple firings of the 45 mm stapler the right lower lobe mass was excised. This was sent for frozen section returned an adenocarcinoma with negative margins. Grossly the lesion was about a centimeter  from the staple line. Pathologically the lesion was said to be 5 mm from the staple line after the staples were removed. There is a small amount of bleeding along the staple line and this was secured with electrocautery and progel. We did open the subcarinal space but there were no lymph nodes immediately available. There are no lymph nodes in the inferior pulmonary ligament. A single chest tube was inserted through our most inferior port and brought up through a separate stab wound. The tube was positioned at the apex of the right chest. The tube was secured with 0 silk. The port site was closed with 0 Vicryl and staples. The lung was reinflated and the chest was closed. #2 Vicryl pericostal sutures were used on the ribs. The latissimus muscle was closed with #2 Vicryl. Subcutaneous tissues were closed with 2-0 Vicryl and the skin with skin clips. Sterile dressings were applied. Patient was enrolled in the supine position where she was extubated and taken to the recovery room in stable condition.

## 2014-08-26 NOTE — Progress Notes (Signed)
Ephedrine given for low blood pressure

## 2014-08-26 NOTE — Anesthesia Preprocedure Evaluation (Signed)
Anesthesia Evaluation  Patient identified by MRN, date of birth, ID band Patient awake    Reviewed: Allergy & Precautions, H&P , NPO status , Patient's Chart, lab work & pertinent test results, reviewed documented beta blocker date and time   Airway Mallampati: II  TM Distance: >3 FB Neck ROM: full    Dental   Pulmonary COPDCurrent Smoker,          Cardiovascular hypertension, + Peripheral Vascular Disease Rate:Normal     Neuro/Psych    GI/Hepatic   Endo/Other  diabetes  Renal/GU      Musculoskeletal   Abdominal   Peds  Hematology   Anesthesia Other Findings   Reproductive/Obstetrics                             Anesthesia Physical Anesthesia Plan  ASA: III  Anesthesia Plan: General ETT   Post-op Pain Management:    Induction: Intravenous  Airway Management Planned: Double Lumen EBT  Additional Equipment: Arterial line  Intra-op Plan:   Post-operative Plan:   Informed Consent: I have reviewed the patients History and Physical, chart, labs and discussed the procedure including the risks, benefits and alternatives for the proposed anesthesia with the patient or authorized representative who has indicated his/her understanding and acceptance.     Plan Discussed with: CRNA  Anesthesia Plan Comments:         Anesthesia Quick Evaluation

## 2014-08-26 NOTE — Anesthesia Procedure Notes (Addendum)
Epidural Patient location during procedure: OR Start time: 08/26/2014 7:40 AM  Staffing Performed by: anesthesiologist   Preanesthetic Checklist Completed: patient identified, site marked, surgical consent, pre-op evaluation, timeout performed, IV checked, risks and benefits discussed and monitors and equipment checked  Epidural Patient position: sitting Prep: Betadine Patient monitoring: heart rate, continuous pulse ox, blood pressure and cardiac monitor Approach: midline Location: thoracic (1-12) Injection technique: LOR air  Needle:  Needle type: Tuohy  Needle gauge: 18 G Needle length: 9 cm and 9 Needle insertion depth: 5 cm Catheter type: closed end flexible Catheter size: 20 Guage Catheter at skin depth: 14 cm Test dose: negative and 1.5% lidocaine with Epi 1:200 K  Assessment Sensory level: T10 Events: blood not aspirated, injection not painful, no injection resistance, negative IV test and no paresthesia  Additional Notes   Patient tolerated the insertion well without complications.-SATD -IVTD. No paresthesia. T4 Catheter.  VSST.Reason for block:procedure for pain  Procedure Name: Intubation Date/Time: 08/26/2014 7:49 AM Performed by: Delaney Meigs Pre-anesthesia Checklist: Patient identified, Emergency Drugs available, Suction available, Patient being monitored and Timeout performed Patient Re-evaluated:Patient Re-evaluated prior to inductionOxygen Delivery Method: Circle system utilized Preoxygenation: Pre-oxygenation with 100% oxygen Intubation Type: IV induction Ventilation: Mask ventilation without difficulty Laryngoscope Size: Mac and 3 Grade View: Grade II Tube type: Oral Endobronchial tube: EBT position confirmed by fiberoptic bronchoscope, Double lumen EBT and Left and 35 Fr Number of attempts: 1 Airway Equipment and Method: Stylet Placement Confirmation: ETT inserted through vocal cords under direct vision,  positive ETCO2 and breath sounds checked-  equal and bilateral Secured at: 29 cm Tube secured with: Tape Dental Injury: Teeth and Oropharynx as per pre-operative assessment

## 2014-08-26 NOTE — Progress Notes (Signed)
Inpatient Diabetes Program Recommendations  AACE/ADA: New Consensus Statement on Inpatient Glycemic Control (2013)  Target Ranges:  Prepandial:   less than 140 mg/dL      Peak postprandial:   less than 180 mg/dL (1-2 hours)      Critically ill patients:  140 - 180 mg/dL   Reason for Visit: elevated blood sugars x 2  Diabetes history: Type 2 Outpatient Diabetes medications: Glipizide '5mg'$  bid, Glucophage '500mg'$  bid Current orders for Inpatient glycemic control: none  Please consider adding Novolog sensitive correction scale( 0-9 units) q4h while NPO, change to Novolog tid and hs when she begins eating.  Gentry Fitz, RN, BA, MHA, CDE Diabetes Coordinator Inpatient Diabetes Program  [phone] (Team Pager) [phone] ([place]) 08/26/2014 1:15 PM

## 2014-08-26 NOTE — Transfer of Care (Signed)
Immediate Anesthesia Transfer of Care Note  Patient: Marie [name]  Procedure(s) Performed: Procedure(s): VIDEO ASSISTED THORACOSCOPY (VATS)/THOROCOTOMY (Right)  Patient Location: PACU  Anesthesia Type:General  Level of Consciousness: awake, alert  and oriented  Airway & Oxygen Therapy: Patient Spontanous Breathing and Patient connected to face mask oxygen  Post-op Assessment: Report given to RN, Post -op Vital signs reviewed and stable and Patient moving all extremities X 4  Post vital signs: Reviewed and stable  Last Vitals:  Filed Vitals:   08/26/14 1028  BP:   Pulse: 87  Temp: 36.4 C  Resp: 16    Complications: No apparent anesthesia complications
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
## 2014-08-27 ENCOUNTER — Encounter: Payer: Self-pay | Admitting: *Deleted

## 2014-08-27 LAB — BASIC METABOLIC PANEL
ANION GAP: 8 (ref 5–15)
BUN: 12 mg/dL (ref 6–20)
CHLORIDE: 106 mmol/L (ref 101–111)
CO2: 25 mmol/L (ref 22–32)
Calcium: 8.9 mg/dL (ref 8.9–10.3)
Creatinine, Ser: 0.6 mg/dL (ref 0.44–1.00)
GFR calc non Af Amer: >60 mL/min (ref >60)
Glucose, Bld: 229 mg/dL — ABNORMAL HIGH (ref 65–99)
Potassium: 4.2 mmol/L (ref 3.5–5.1)
Sodium: 139 mmol/L (ref 135–145)

## 2014-08-27 LAB — CBC
HEMATOCRIT: 35.1 % (ref 35.0–47.0)
Hemoglobin: 11.4 g/dL — ABNORMAL LOW (ref 12.0–16.0)
MCH: 30.4 pg (ref 26.0–34.0)
MCHC: 32.6 g/dL (ref 32.0–36.0)
MCV: 93.3 fL (ref 80.0–100.0)
Platelets: 260 10*3/uL (ref 150–440)
RBC: 3.76 MIL/uL — AB (ref 3.80–5.20)
RDW: 14.1 % (ref 11.5–14.5)
WBC: 10.6 10*3/uL (ref 3.6–11.0)

## 2014-08-27 LAB — GLUCOSE, CAPILLARY
GLUCOSE-CAPILLARY: 154 mg/dL — AB (ref 65–99)
Glucose-Capillary: 227 mg/dL — ABNORMAL HIGH (ref 65–99)
Glucose-Capillary: 228 mg/dL — ABNORMAL HIGH (ref 65–99)

## 2014-08-27 MED ORDER — INSULIN ASPART 100 UNIT/ML ~~LOC~~ SOLN
0.0000 [IU] | Freq: Three times a day (TID) | SUBCUTANEOUS | Status: DC
  Administered 2014-08-28 (×4): 2 [IU] via SUBCUTANEOUS
  Administered 2014-08-29: 1 [IU] via SUBCUTANEOUS
  Administered 2014-08-29 (×2): 3 [IU] via SUBCUTANEOUS
  Administered 2014-08-30 – 2014-08-31 (×4): 2 [IU] via SUBCUTANEOUS
  Administered 2014-08-31: 3 [IU] via SUBCUTANEOUS
  Administered 2014-08-31 – 2014-09-01 (×3): 2 [IU] via SUBCUTANEOUS
  Administered 2014-09-01 – 2014-09-02 (×3): 1 [IU] via SUBCUTANEOUS
  Filled 2014-08-27: qty 2
  Filled 2014-08-27: qty 1
  Filled 2014-08-27 (×3): qty 2
  Filled 2014-08-27: qty 1
  Filled 2014-08-27: qty 2
  Filled 2014-08-27: qty 3
  Filled 2014-08-27: qty 2
  Filled 2014-08-27 (×2): qty 1
  Filled 2014-08-27: qty 3
  Filled 2014-08-27 (×2): qty 2
  Filled 2014-08-27: qty 1
  Filled 2014-08-27: qty 2
  Filled 2014-08-27: qty 3
  Filled 2014-08-27 (×2): qty 2

## 2014-08-27 MED ORDER — OXYCODONE-ACETAMINOPHEN 5-325 MG PO TABS
1.0000 | ORAL_TABLET | ORAL | Status: DC | PRN
  Administered 2014-08-27: 2 via ORAL
  Administered 2014-08-27 (×2): 1 via ORAL
  Administered 2014-08-28 (×2): 2 via ORAL
  Filled 2014-08-27: qty 1
  Filled 2014-08-27: qty 2
  Filled 2014-08-27: qty 1
  Filled 2014-08-27 (×2): qty 2

## 2014-08-27 NOTE — Progress Notes (Signed)
Received pt from CCU. Report taken from [facility]. Pt stable. Pt hooked up to low suction for chest tube. SCD's on. On 3L [place]. Waiting on pt's tray from dining to eat.

## 2014-08-27 NOTE — Progress Notes (Signed)
Patient ID: Marie [name], female   DOB: 10/14/1955, 59 y.o.   MRN: [id number]  HISTORY: Main issue is pain but this is improving.  Not short of breath   PERTINENT REVIEW OF SYSTEMS: ROS   Filed Vitals:   08/27/14 1205  BP:   Pulse: 74  Temp:   Resp: 14   Wt Readings from Last 3 Encounters:  08/27/14 93.5 kg (206 lb 2.1 oz)  06/21/13 86.183 kg (190 lb)  05/17/13 84.369 kg (186 lb)    EXAM:   Eyes:  Conjunctivae are normal. Pupils are equal, round, and reactive to light. No scleral icterus.  Neck:  Normal range of motion. Neck supple. No tracheal deviation present. No thyromegaly present.  Resp: Lungs are clear bilaterally.  No respiratory distress, normal effort. Heart:  Regular without murmurs   Skin: Skin is warm and dry. No rash noted. No diaphoretic. No erythema. No pallor.      ASSESSMENT: Postop day 1.  She does not think that her epidural is working so we will discontinue.  CXRAy from today looked OK.     PLAN:   Transfer to floor Discontinue Foley and Epidural and Art Line Advance diet Wound care consult for diabetic foot ulcer    Nestor Lewandowsky, MD

## 2014-08-27 NOTE — Anesthesia Postprocedure Evaluation (Signed)
  Anesthesia Post-op Note  Patient: Marie [name]  Procedure(s) Performed: Procedure(s): VIDEO ASSISTED THORACOSCOPY (VATS)/THOROCOTOMY (Right)  Anesthesia type:General ETT  Patient location: CCU4  Post pain: Pain level controlled, has thoracic epidural, original rate decreased to 51m due to hypotension.  Pt complains of 9/10 pain although face and demeanor do not indicate this.  She is taking po pain meds as well.  C/o itching.  VSS, plan to D/C Epidural today.                   KLarina Bras CRNA  Post assessment: Post-op Vital signs reviewed, Patient's Cardiovascular Status Stable, Respiratory Function Stable, Patent Airway and No signs of Nausea or vomiting  Post vital signs: Reviewed and stable  Last Vitals:  Filed Vitals:   08/27/14 0700  BP: 98/62  Pulse: 75  Temp:   Resp: 18    Level of consciousness: awake, alert  and patient cooperative  Complications: No apparent anesthesia complications

## 2014-08-27 NOTE — Progress Notes (Signed)
Initial Nutrition Assessment  DOCUMENTATION CODES:     INTERVENTION:  Coordination of care:   RD provided " Plate Method for DM." Discussed different food groups and their effects on blood sugar, emphasizing carbohydrate-containing foods. Provided list of carbohydrates and recommended serving sizes of common foods.  Discussed importance of controlled and consistent carbohydrate intake throughout the day. Provided examples of ways to balance meals/snacks and encouraged intake of high-fiber, whole grain complex carbohydrates.  Expect fair compliance.  NUTRITION DIAGNOSIS:  Food and nutrition related knowledge deficit related to limited prior education as evidenced by per patient/family report.    GOAL:   (Goal would be for pt to eat at least 75% of meals)    MONITOR:   (Energy intake, Glucose profile)  REASON FOR ASSESSMENT:  Consult Diet education  ASSESSMENT:  Pt s/p thoracoscopy and thoracotomy on right, pre op bronch for pulmonary nodules.   Past Medical History  Diagnosis Date  . Diabetes mellitus without complication   . Arthritis   . Hypertension   . PAD (peripheral artery disease)   . COPD (chronic obstructive pulmonary disease)    Glucose Profile:  Recent Labs  08/26/14 1240 08/27/14 0034 08/27/14 1311  GLUCAP 205* 227* 228*   Electrolyte and Renal Profile:    Recent Labs Lab 08/27/14 0407  BUN 12  CREATININE 0.60  NA 139  K 4.2   Pt reports good appetite prior to admission, typically eats late breakfast/early lunch and evening meal with bedtime snack.  Has been trying to eat healthy foods (fruits, veggies, lean meats).  Was not aware that juice could increase blood glucose  Diet just progressed to solid foods today and has not received tray yet.  Complaining of headache on visit.   Nutrition-Focused physical exam completed. Findings are WDL for fat depletion, muscle depletion, and edema.   Height:  Ht Readings from Last 1 Encounters:   08/26/14 '5\' 5"'$  (1.651 m)    Weight: stable weight per pt, maybe fluid weight gain  Wt Readings from Last 1 Encounters:  08/27/14 206 lb 2.1 oz (93.5 kg)      Wt Readings from Last 10 Encounters:  08/27/14 206 lb 2.1 oz (93.5 kg)  06/21/13 190 lb (86.183 kg)  05/17/13 186 lb (84.369 kg)  04/19/13 180 lb (81.647 kg)  02/12/13 186 lb (84.369 kg)  01/25/13 185 lb (83.915 kg)    BMI:  Body mass index is 34.3 kg/(m^2).   Skin:  Wound (see comment) (DM foot ulcer)  Diet Order:  Diet Carb Modified Fluid consistency:: Thin; Room service appropriate?: Yes  EDUCATION NEEDS:  Education needs addressed   Intake/Output Summary (Last 24 hours) at 08/27/14 1511 Last data filed at 08/27/14 0900  Gross per 24 hour  Intake 1538.75 ml  Output   3188 ml  Net -1649.25 ml    Last BM:  5/16  LOW Care Level Zylon Creamer B. Zenia Resides, [place], [place] (pager)

## 2014-08-27 NOTE — Care Management (Signed)
Anticipate patient will transfer out of ICU to the floor s/p thoracotomy for lung cancer.   Pain control had been an issue

## 2014-08-27 NOTE — Progress Notes (Signed)
Report called to Rigoberto Noel, RN. All patient's belongings placed in bed with pt and transported. Kathi Simpers, RN 5:33 PM

## 2014-08-28 ENCOUNTER — Encounter: Payer: Self-pay | Admitting: Radiology

## 2014-08-28 ENCOUNTER — Inpatient Hospital Stay: Payer: Medicare Other

## 2014-08-28 LAB — GLUCOSE, CAPILLARY
GLUCOSE-CAPILLARY: 184 mg/dL — AB (ref 65–99)
Glucose-Capillary: 158 mg/dL — ABNORMAL HIGH (ref 65–99)
Glucose-Capillary: 221 mg/dL — ABNORMAL HIGH (ref 65–99)
Glucose-Capillary: 196 mg/dL — ABNORMAL HIGH (ref 65–99)
Glucose-Capillary: 156 mg/dL — ABNORMAL HIGH (ref 65–99)

## 2014-08-28 LAB — BLOOD GAS, ARTERIAL
ACID-BASE EXCESS: 3 mmol/L (ref 0.0–3.0)
Acid-Base Excess: 2.1 mmol/L (ref 0.0–3.0)
Allens test (pass/fail): POSITIVE — AB
Allens test (pass/fail): POSITIVE — AB
BICARBONATE: 29 meq/L — AB (ref 21.0–28.0)
Bicarbonate: 28.7 mEq/L — ABNORMAL HIGH (ref 21.0–28.0)
FIO2: 100 %
FIO2: 100 %
O2 Saturation: 92.9 %
O2 Saturation: 98.2 %
PH ART: 7.35 (ref 7.350–7.450)
PH ART: 7.38 (ref 7.350–7.450)
Patient temperature: 37
Patient temperature: 37
pCO2 arterial: 52 mmHg — ABNORMAL HIGH (ref 32.0–48.0)
pCO2 arterial: 49 mmHg — ABNORMAL HIGH (ref 32.0–48.0)
pO2, Arterial: 70 mmHg — ABNORMAL LOW (ref 83.0–108.0)
pO2, Arterial: 110 mmHg — ABNORMAL HIGH (ref 83.0–108.0)

## 2014-08-28 MED ORDER — HEPARIN SODIUM (PORCINE) 5000 UNIT/ML IJ SOLN
5000.0000 [IU] | Freq: Two times a day (BID) | INTRAMUSCULAR | Status: DC
  Administered 2014-08-28 – 2014-09-02 (×10): 5000 [IU] via SUBCUTANEOUS
  Filled 2014-08-28 (×10): qty 1

## 2014-08-28 MED ORDER — NALOXONE HCL 0.4 MG/ML IJ SOLN
0.4000 mg | Freq: Once | INTRAMUSCULAR | Status: AC
  Administered 2014-08-28: 0.4 mg via INTRAVENOUS
  Filled 2014-08-28: qty 1

## 2014-08-28 MED ORDER — IOHEXOL 350 MG/ML SOLN
100.0000 mL | Freq: Once | INTRAVENOUS | Status: AC | PRN
  Administered 2014-08-28: 100 mL via INTRAVENOUS

## 2014-08-28 MED ORDER — NAPROXEN 250 MG PO TABS
500.0000 mg | ORAL_TABLET | Freq: Two times a day (BID) | ORAL | Status: DC
  Administered 2014-08-28 – 2014-09-02 (×10): 500 mg via ORAL
  Filled 2014-08-28 (×3): qty 2
  Filled 2014-08-28 (×3): qty 1
  Filled 2014-08-28 (×3): qty 2
  Filled 2014-08-28: qty 1
  Filled 2014-08-28: qty 2

## 2014-08-28 MED ORDER — OXYCODONE-ACETAMINOPHEN 7.5-325 MG PO TABS
2.0000 | ORAL_TABLET | ORAL | Status: DC | PRN
  Administered 2014-08-28 – 2014-09-02 (×14): 2 via ORAL
  Filled 2014-08-28 (×15): qty 2

## 2014-08-28 MED ORDER — OXYCODONE-ACETAMINOPHEN 7.5-325 MG PO TABS
1.0000 | ORAL_TABLET | ORAL | Status: DC | PRN
  Administered 2014-08-29 – 2014-08-30 (×5): 1 via ORAL
  Filled 2014-08-28 (×5): qty 1

## 2014-08-28 NOTE — Progress Notes (Signed)
Pulmonary Critical Care  Follow Up   Marie [name] FBP:[id number] DOB: [birth year] DOA: 08/26/2014  Referring physician: Graciela Husbands, MD PCP: Cletis Athens, MD     Marie [name] is a 59 y.o. female admitted for resection of lung mass. This afternoon patient had a rapid response called as she was less responsive and lethargic. Patient had an ABG done on 100% non-rebreather which shows hypoxia at this time. She was transferred to CCU and is still on 100% FM with saturations now in the 100% range. She has been on pain medications but concern is raised if this could be a PE post-operatively.   Review of Systems:  Patient is responsive now to verbal command but is sleepy. She complains of pain which she had been complaining of before but has no other specific complaints. Remainder ROS performed and is unremarkable other than noted in HPI  Past Medical History  Diagnosis Date  . Diabetes mellitus without complication   . Arthritis   . Hypertension   . PAD (peripheral artery disease)   . COPD (chronic obstructive pulmonary disease)    Past Surgical History  Procedure Laterality Date  . Abdominal hysterectomy    . Cesarean section      x3  . Foot surgery Left     diabetic ulcer  . Carpal tunnel release Bilateral   . Knee arthroscopy Bilateral   . Tonsillectomy    . Back surgery  x 7  . Video assisted thoracoscopy (vats)/thorocotomy Right 08/26/2014    Procedure: VIDEO ASSISTED THORACOSCOPY (VATS)/THOROCOTOMY;  Surgeon: Nestor Lewandowsky, MD;  Location: [facility] ORS;  Service: General;  Laterality: Right;   Social History:  reports that she has been smoking Cigarettes.  She has been smoking about 0.50 packs per day. She has never used smokeless tobacco. She reports that she does not drink alcohol or use illicit drugs.  Allergies  Allergen Reactions  . Penicillins Anaphylaxis and Shortness Of Breath  . Avelox [Moxifloxacin Hcl In Nacl] Other (See Comments)    MUSCLE CRAMPS  . Vicodin  [Hydrocodone-Acetaminophen] Nausea Only  . Neurontin [Gabapentin] Anxiety    History reviewed. No pertinent family history.  Prior to Admission medications   Medication Sig Start Date End Date Taking? Authorizing Provider  ALPRAZolam (XANAX) 0.25 MG tablet Take 0.25 mg by mouth. 1 pill twice daily   Yes Historical Provider, MD  budesonide-formoterol (SYMBICORT) 160-4.5 MCG/ACT inhaler Inhale 2 puffs into the lungs 2 (two) times daily.   Yes Historical Provider, MD  buPROPion (WELLBUTRIN XL) 150 MG 24 hr tablet Take 150 mg by mouth daily.   Yes Historical Provider, MD  cyclobenzaprine (FLEXERIL) 10 MG tablet Take 10 mg by mouth 3 (three) times daily.    Yes Historical Provider, MD  diazepam (VALIUM) 5 MG tablet Take 5 mg by mouth at bedtime.    Yes Historical Provider, MD  diphenhydrAMINE (BENADRYL) 25 MG tablet Take 25 mg by mouth every 6 (six) hours as needed for allergies.   Yes Historical Provider, MD  DULoxetine (CYMBALTA) 60 MG capsule Take 60 mg by mouth daily.   Yes Historical Provider, MD  estradiol (ESTRACE) 1 MG tablet Take 1 mg by mouth daily.   Yes Historical Provider, MD  furosemide (LASIX) 40 MG tablet Take 40 mg by mouth daily.   Yes Historical Provider, MD  gemfibrozil (LOPID) 600 MG tablet Take 600 mg by mouth 2 (two) times daily before a meal.   Yes Historical Provider, MD  glipiZIDE (GLUCOTROL) 5 MG tablet Take  5 mg by mouth 2 (two) times daily before a meal.   Yes Historical Provider, MD  HYDROcodone-acetaminophen (NORCO/VICODIN) 5-325 MG per tablet Take 1 tablet by mouth every 8 (eight) hours as needed for moderate pain.   Yes Historical Provider, MD  Insulin Glargine 300 UNIT/ML SOPN Inject 50 Units into the skin every morning.   Yes Historical Provider, MD  metFORMIN (GLUCOPHAGE) 500 MG tablet Take 500 mg by mouth 2 (two) times daily with a meal.   Yes Historical Provider, MD  ondansetron (ZOFRAN-ODT) 4 MG disintegrating tablet  09/30/13  Yes Historical Provider, MD   tiotropium (SPIRIVA) 18 MCG inhalation capsule Place 18 mcg into inhaler and inhale daily.   Yes Historical Provider, MD  zolpidem (AMBIEN) 10 MG tablet Take 10 mg by mouth at bedtime.   Yes Historical Provider, MD  propranolol (INDERAL) 40 MG tablet Take 40 mg by mouth 2 (two) times daily.    Historical Provider, MD   Physical Exam: Filed Vitals:   08/28/14 1547 08/28/14 1555 08/28/14 1600 08/28/14 1702  BP: 119/78  121/78 130/80  Pulse: 78  74 74  Temp: 98.2 F (36.8 C)     TempSrc: Axillary     Resp: '18  17 17  '$ Height:      Weight:      SpO2: 100% 100% 99% 100%    Wt Readings from Last 3 Encounters:  08/27/14 93.5 kg (206 lb 2.1 oz)  06/21/13 86.183 kg (190 lb)  05/17/13 84.369 kg (186 lb)    General:  Appears somnolent Eyes: normal lids, irises & conjunctiva ENT: grossly normal hearing, lips & tongue Neck: no LAD, thyromegaly Cardiovascular: RRR, no m/r/g. No LE edema. Respiratory: CTA bilaterally, no w/r/r. Normal respiratory effort. Psychiatric: unable to assess Neurologic: grossly moving all four extremities but somnolent          Labs on Admission:  Basic Metabolic Panel:  Recent Labs Lab 08/27/14 0407  NA 139  K 4.2  CL 106  CO2 25  GLUCOSE 229*  BUN 12  CREATININE 0.60  CALCIUM 8.9   Liver Function Tests: No results for input(s): AST, ALT, ALKPHOS, BILITOT, PROT, ALBUMIN in the last 168 hours. No results for input(s): LIPASE, AMYLASE in the last 168 hours. No results for input(s): AMMONIA in the last 168 hours. CBC:  Recent Labs Lab 08/27/14 0407  WBC 10.6  HGB 11.4*  HCT 35.1  MCV 93.3  PLT 260   Cardiac Enzymes: No results for input(s): CKTOTAL, CKMB, CKMBINDEX, TROPONINI in the last 168 hours.  BNP (last 3 results) No results for input(s): BNP in the last 8760 hours.  ProBNP (last 3 results) No results for input(s): PROBNP in the last 8760 hours.  CBG:  Recent Labs Lab 08/27/14 1311 08/27/14 2101 08/28/14 0800  08/28/14 1147 08/28/14 1545  GLUCAP 228* 154* 158* 184* 221*    Radiological Exams on Admission: Ct Angio Chest Pe W/cm &/or Wo Cm  08/28/2014   CLINICAL DATA:  Lethargic and hypoxic.  EXAM: CT ANGIOGRAPHY CHEST WITH CONTRAST  TECHNIQUE: Multidetector CT imaging of the chest was performed using the standard protocol during bolus administration of intravenous contrast. Multiplanar CT image reconstructions and MIPs were obtained to evaluate the vascular anatomy.  CONTRAST:  111m OMNIPAQUE IOHEXOL 350 MG/ML SOLN  COMPARISON:  04/29/2014  FINDINGS: Negative for a pulmonary embolism. No evidence for aortic dissection. Ascending thoracic aorta roughly measures 3.3 cm. Trace amount of right pleural fluid. No significant pericardial effusion. No significant  chest lymphadenopathy.  Surgical changes compatible with a right thoracotomy with surgical clips in the right lower lobe. Diffuse low density throughout the liver is suggestive for hepatic steatosis.  There is a right chest tube which terminates at the medial right lung apex. There is a trace amount of right pleural air at the right lung base and along the medial right lung. Patient has underlying emphysematous changes with blebs along the medial left upper lobe. There is consolidation and volume loss in the right lower lobe which is probably postsurgical. The spiculated lesion in the right lower lobe has been removed. There is a 5 mm nodule in the right lower lobe on sequence 6, image 60 which roughly measured 3 mm on the prior examination. There is volume loss in the left lower lobe.  Small amount of subcutaneous gas in the chest. No acute bone abnormality. There is mild distention of the distal esophagus with debris.  Review of the MIP images confirms the above findings.  IMPRESSION: Negative for pulmonary embolism.  Postsurgical changes compatible with resection of the right lower lobe lesion. There is a 5 mm nodular density in the right lower lobe and  recommend attention to this area on follow-up CT imaging.  Volume loss in the lower lobes bilaterally.  Right chest tube with a trace amount of right pleural air.  Emphysematous changes.   Electronically Signed   By: Markus Daft M.D.   On: 08/28/2014 18:18   Dg Chest Port 1 View  08/28/2014   CLINICAL DATA:  Chest tube placement RIGHT side post VATS on 08/26/2014, shortness of breath, history hypertension, diabetes, COPD  EXAM: PORTABLE CHEST - 1 VIEW  COMPARISON:  Portable exam 1542 hr compared to earlier exam of 0607 hours  FINDINGS: RIGHT thoracostomy tube stable tip near RIGHT apex.  Skin clips lower RIGHT chest laterally.  Scattered a bedding artifacts.  Enlargement of cardiac silhouette.  Mediastinal contours and pulmonary vascularity normal.  Decreased lung volumes with RIGHT basilar atelectasis.  No gross pleural effusion or pneumothorax.  IMPRESSION: RIGHT basilar atelectasis.  No pneumothorax in patient with RIGHT thoracostomy tube.   Electronically Signed   By: Lavonia Dana M.D.   On: 08/28/2014 16:12   Dg Chest Port 1 View  08/28/2014   CLINICAL DATA:  Lung cancer.  Chest tube.  EXAM: PORTABLE CHEST - 1 VIEW  COMPARISON:  08/26/2014 .  FINDINGS: Right chest tube in stable position. Mediastinum hilar structures stable. Stable cardiomegaly. Persistent atelectasis and/or infiltrate right lower lobe. No pleural effusion or pneumothorax. Surgical staples over the right chest.  IMPRESSION: 1. Right chest tube in stable position. No pneumothorax. Surgical staples noted over the right chest. 2. Persistent atelectasis and/or infiltrate right lower lobe.   Electronically Signed   By: Marcello Moores  Register   On: 08/28/2014 07:41    EKG: Independently reviewed.  Assessment/Plan Active Problems:   Lung cancer   1. Acute mental Status change -CT ordered and is negative for PE -suspect may be related to her narcotic medications -would try a dose of narcan -switch to NSAIDs for pain  control  2. Adenocarcinoma of the lung -on biopsy per Dr Faith Rogue -will need Oncology follow up  3. COPD -would continue with current therapy  4. Pain Control -would suggest cutting back on narcotics and using NSAIDs for her pain control at this time -may be contributing to her increased somnolence   Code Status: Full Code (must indicate code status--if unknown or must be presumed, indicate  so) DVT Prophylaxis:heparin Family Communication: None (indicate person spoken with, if applicable, with phone number if by telephone) Disposition Plan: Home (indicate anticipated LOS)  Time spent: 29mn Critical Care    I have personally obtained a history, examined the patient, evaluated laboratory and imaging results, formulated the assessment and plan and placed orders.  The Patient requires high complexity decision making for assessment and support.    SAllyne Gee MD FMenomonee Falls Ambulatory Surgery CenterPulmonary Critical Care Medicine Sleep Medicine

## 2014-08-28 NOTE — Progress Notes (Signed)
Chaplain responds to medical alert 3:11pm room 224. Chaplain offered prayer and support.  Loralyn Freshwater D. Alroy Dust Thursday 08-28-2014   08/28/14 1511  Clinical Encounter Type  Visited With Patient  Visit Type Code (Medical Alert room 224 @ 3:11pm)  Referral From Nurse  Consult/Referral To Chaplain  Spiritual Encounters  Spiritual Needs Prayer  Stress Factors  Patient Stress Factors None identified  Family Stress Factors None identified

## 2014-08-28 NOTE — Progress Notes (Signed)
Rapid response called on pt at 1500 due to lethargy, and hypoxia.  BP stable, nsr 70's.  Pt lethargic, arouses to touch and follows simple commands.  Pt placed on nonrebreather mask, Dr. Genevive Bi notified and on way to bedside.  Pt had previously received prn pain medications.  Dr. Genevive Bi came to bedside to examine pt, pt transferred to ICU per Dr. Genevive Bi.  ABG and CXR performed, pt also to received narcan.  Will obtain CT chest as well per Dr. Genevive Bi.

## 2014-08-28 NOTE — Consult Note (Signed)
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  Present on admission.   Wound type:Neuropathic/diabetic ulcer Pressure Ulcer POA: Yes Measurement: 4 cm x 3.5 cm with callous present circumferentially.   Wound bed: 100% dark calloused eschar.  Intact Drainage (amount, consistency, odor) No drainage.  Periwound:Calloused Dressing procedure/placement/frequency: Cleanse ulcer to left plantar foot with soap and water.  Paint with betadine daily.       Will not follow at this time.  Please re-consult if needed.  Domenic Moras RN BSN [facility] Pager [phone]

## 2014-08-28 NOTE — Progress Notes (Signed)
Called to see patient who was found somewhat lethargic and hypoxic.  Sats in high 50s.  Put on face mask with sats in the high 90s.  Glucose is high at 221.  HR is 70 80.  BP120/80.  No air leak.  Lungs equal bilaterally.  No wheezing.  ABG is 7.35/52/70.  Transferred to ICU.  Still somewhat short of breath and lethargic but she says she is just tired and needs to sleep.  Discussed with Dr. Humphrey Rolls.  He will see or arrange for someone to see here in the ICU.  Will get CT as CXRAy is unchanged with LLL atelectais.  [employer].

## 2014-08-28 NOTE — Anesthesia Post-op Follow-up Note (Signed)
  Anesthesia Pain Follow-up Note  Patient: Marie [name]  Day #:2 Location: CCU12  Date of Follow-up: 08/28/2014 Time: 3:57 PM  Last Vitals:  Filed Vitals:   08/28/14 1519  BP: 116/60  Pulse: 76  Temp:   Resp:     Level of Consciousness: alert  Pain: moderate   Side Effects:None  Catheter Site Exam:catheter d/c'ed yesterday  Plan: D/C from anesthesia care   Pt re-admitted to CCU this afternoon for O2 desat.  Estill Batten

## 2014-08-28 NOTE — Significant Event (Signed)
Went in to patient's room to perform breathing treatment a little after 3pm.  Patient lethargic and stated very tired.  Patient saturations were 55% on room air.  Placed patient on a non rebreather and her saturations came up to 98%.  I had already called for nurse to come to the room and it was then decided to call a rapid response. Obtained an ABG and shortly after it was decided that the patient would be moved to CCU.

## 2014-08-28 NOTE — Progress Notes (Signed)
Patient O2 level on 55%, lethargic and clammy.  Rapid response called, patient placed on non rebreather, blood gas taken, patient transferred to CCU 12

## 2014-08-28 NOTE — Progress Notes (Signed)
Patient ID: Marie [name], female   DOB: [birth year], 57 y.o.   MRN: [id number]  HISTORY: Still complains of pain. She was able to get out of bed some yesterday. She is able to pass her own urine and a reasonably well although she states she's not all that hungry. She was seen by the wound care nurse.   PERTINENT REVIEW OF SYSTEMS: Has some pain and nausea. Appetite still is diminished.  Filed Vitals:   08/28/14 0809  BP: 110/66  Pulse: 71  Temp: 97.9 F (36.6 C)  Resp: 14   Wt Readings from Last 3 Encounters:  08/27/14 93.5 kg (206 lb 2.1 oz)  06/21/13 86.183 kg (190 lb)  05/17/13 84.369 kg (186 lb)    EXAM: Her lungs sound distant today. They are equal bilaterally however. Her wound is well approximated without erythema or drainage. Chest tube site is clean and dry.     ASSESSMENT: Pathology still pending but frozen section was consistent with a lung cancer. We did ambulate in the halls today. I have encouraged her to be as active as possible. We will try to wean her oxygen as well.   PLAN:   I will contact pathology regarding the diagnosis and also encouraged patient to be out of bed as much as possible. We will begin her on subcutaneous heparin. I will increase her oral pain medication.    Nestor Lewandowsky, MD

## 2014-08-29 ENCOUNTER — Inpatient Hospital Stay: Payer: Medicare Other

## 2014-08-29 LAB — GLUCOSE, CAPILLARY
Glucose-Capillary: 205 mg/dL — ABNORMAL HIGH (ref 65–99)
Glucose-Capillary: 218 mg/dL — ABNORMAL HIGH (ref 65–99)
Glucose-Capillary: 149 mg/dL — ABNORMAL HIGH (ref 65–99)

## 2014-08-29 MED ORDER — TIOTROPIUM BROMIDE MONOHYDRATE 18 MCG IN CAPS
18.0000 ug | ORAL_CAPSULE | Freq: Every day | RESPIRATORY_TRACT | Status: DC
  Administered 2014-08-29 – 2014-09-02 (×4): 18 ug via RESPIRATORY_TRACT
  Filled 2014-08-29: qty 5

## 2014-08-29 MED ORDER — BUDESONIDE-FORMOTEROL FUMARATE 160-4.5 MCG/ACT IN AERO
2.0000 | INHALATION_SPRAY | Freq: Two times a day (BID) | RESPIRATORY_TRACT | Status: DC
  Administered 2014-08-29 – 2014-09-02 (×8): 2 via RESPIRATORY_TRACT
  Filled 2014-08-29: qty 6

## 2014-08-29 MED ORDER — METFORMIN HCL 500 MG PO TABS
500.0000 mg | ORAL_TABLET | Freq: Two times a day (BID) | ORAL | Status: DC
  Administered 2014-09-01 – 2014-09-02 (×3): 500 mg via ORAL
  Filled 2014-08-29 (×6): qty 1

## 2014-08-29 NOTE — Clinical Documentation Improvement (Signed)
MD's, NP's, and PA's  Noted documentation of "lethargic and hypoxic. Sats in high 50s. Put on face mask with sats in the high 90s" "ABG is 7.35/52/70. Transferred to ICU" , placed on non re breather mask at 15 L  up from 3L [place], HHN treatments started.  Please document clinical condition if appropriate for this admission. Thank you   Possible Clinical Conditions?  Post op Atelectasis Acute Respiratory Distress  Acute Respiratory Failure  Acute Respiratory Insufficiency following surgery  Other Condition Cannot Clinically Determine    Risk Factors:Lung CA s/p VATS, COPD  Diagnostics: chest xray daily , CT Angio of Chest   Treatment: increased level of care, HHN, increased Oxygen 3L [place]  To 15 L non rebreather mask  Thank You, Ree Kida ,RN Clinical Documentation Specialist:  [place] [employer]

## 2014-08-29 NOTE — Progress Notes (Signed)
Patient ID: Marie [name], female   DOB: 1956/04/09, 59 y.o.   MRN: [id number]  HISTORY: She is more alert today. She states she didn't get a whole lot of sleep last night but does feel better. She does have some shortness of breath but again this is improved. Her CT scan last night did not reveal any evidence of a pulmonary embolism.     Filed Vitals:   08/29/14 0932  BP: 103/60  Pulse: 66  Temp:   Resp:    Wt Readings from Last 3 Encounters:  08/27/14 93.5 kg (206 lb 2.1 oz)  06/21/13 86.183 kg (190 lb)  05/17/13 84.369 kg (186 lb)    EXAM:   Resp: Lungs are clear bilaterally.  No respiratory distress, normal effort. Heart:  Regular without murmurs Abd:  Abdomen is soft, non distended and non tender. No masses are palpable.  There is no rebound and no guarding.   Skin: Skin is warm and dry. No rash noted. No diaphoretic. No erythema. No pallor.  Psychiatric: Normal mood and affect. Normal behavior. Judgment and thought content normal.      ASSESSMENT: Status post right lower lobe wedge resection for adenocarcinoma. She remains with moderate amount of pain. We will continue to provide her adequate pain control and encourage incentive spirometry of breathing and ambulation. We will also continue her subcutaneous heparin.   PLAN:   I think we should watch her in the ICU today where she can get some more individualized attention for her postoperative management. We'll try to wean her oxygen. We'll keep her chest tube in at the present time.    Nestor Lewandowsky, MD

## 2014-08-29 NOTE — Progress Notes (Signed)
[name] NOTE  Pharmacy Consult for electrolyte management Indication: ICU Status   Allergies  Allergen Reactions  . Penicillins Anaphylaxis and Shortness Of Breath  . Avelox [Moxifloxacin Hcl In Nacl] Other (See Comments)    MUSCLE CRAMPS  . Vicodin [Hydrocodone-Acetaminophen] Nausea Only  . Neurontin [Gabapentin] Anxiety    Patient Measurements: Height: '5\' 5"'$  (165.1 cm) Weight: 206 lb 2.1 oz (93.5 kg) IBW/kg (Calculated) : 57   Vital Signs: BP: 96/68 mmHg (05/20 1100) Pulse Rate: 65 (05/20 1100) Intake/Output from previous day: 05/19 0701 - 05/20 0700 In: 480 [P.O.:480] Out: 1290 [Urine:1200; Chest Tube:90] Intake/Output from this shift:   Vent settings for last 24 hours: Vent Mode:  [-]  FiO2 (%):  [100 %] 100 %  Labs:  Recent Labs  08/27/14 0407  WBC 10.6  HGB 11.4*  HCT 35.1  PLT 260  CREATININE 0.60   Estimated Creatinine Clearance: 87.7 mL/min (by C-G formula based on Cr of 0.6).   Recent Labs  08/28/14 2215 08/29/14 0752 08/29/14 1205  GLUCAP 156* 205* 218*    Microbiology: No results found for this or any previous visit (from the past 720 hour(s)).  Medications:  Scheduled:  . albuterol  2.5 mg Nebulization Q4H while awake  . antiseptic oral rinse  7 mL Mouth Rinse BID  . bisacodyl  10 mg Oral Daily  . DULoxetine  90 mg Oral Daily  . furosemide  40 mg Oral Daily  . gemfibrozil  600 mg Oral BID AC  . heparin subcutaneous  5,000 Units Subcutaneous Q12H  . insulin aspart  0-9 Units Subcutaneous TID WC  . naproxen  500 mg Oral BID WC  . propranolol  40 mg Oral BID   Infusions:    Assessment: 59 yo female admitted to ICU s/p rapid response on 5/19.     Plan:  Electrolytes are WNL. Will order follow-up electrolytes with am labs and adjust accordingly.    Pharmacy will continue to monitor and adjust per consult.    Marie [name] 08/29/2014,2:30 PM

## 2014-08-29 NOTE — Consult Note (Signed)
Patient Demographics  Marie [name], is a 59 y.o. female   MRN: [id number]   DOB - February 17, 1956  Admit Date - 08/26/2014    Outpatient Primary MD for the patient is MASOUD,JAVED, MD  Consult requested in the Hospital by Nestor Lewandowsky, MD, On 08/29/2014    Reason for consult management of hypertension, diabetes.     Past Medical History  Diagnosis Date  . Diabetes mellitus without complication   . Arthritis   . Hypertension   . PAD (peripheral artery disease)   . COPD (chronic obstructive pulmonary disease)       Past Surgical History  Procedure Laterality Date  . Abdominal hysterectomy    . Cesarean section      x3  . Foot surgery Left     diabetic ulcer  . Carpal tunnel release Bilateral   . Knee arthroscopy Bilateral   . Tonsillectomy    . Back surgery  x 7  . Video assisted thoracoscopy (vats)/thorocotomy Right 08/26/2014    Procedure: VIDEO ASSISTED THORACOSCOPY (VATS)/THOROCOTOMY;  Surgeon: Nestor Lewandowsky, MD;  Location: [facility] ORS;  Service: General;  Laterality: Right;     HPI  Marie [name]  is a 59 y.o. female, with COPD presented with enlarging right lung mass. Patient admitted to Dr. Genevive Bi service, had a VATS right thoracotomy on May 17. Patient to O2 levels dropped to 55% on May 19 and he weighs she became lethargic and clammy. Patient placed on nonrebreather and moved to ICU. Patient monitored in ICU closely, repeat chest x-ray showed improvement of pulmonary interstitium no pneumothorax. Patient is condition improved, saturating 94% on room air Patient has a right-sided chest tube. We were consulted for management of hypertension, diabetes.   Review of Systems    In addition to the HPI above, No Fever-chills, No Headache, No changes with Vision or hearing, No problems swallowing food or  Liquids, Pleuritic chest pain/ No Abdominal pain, No Nausea or Vommitting, Bowel movements are regular, No Blood in stool or Urine, No dysuria, No new skin rashes or bruises, No new joints pains-aches,  No new weakness, tingling, numbness in any extremity, No recent weight gain or loss, No polyuria, polydypsia or polyphagia, No significant Mental Stressors.  A full 10 point Review of Systems was done, except as stated above, all other Review of Systems were negative.   Social History History  Substance Use Topics  . Smoking status: Current Every Day Smoker -- 0.50 packs/day    Types: Cigarettes  . Smokeless tobacco: Never Used  . Alcohol Use: No     Family History History reviewed. No pertinent family history.  Prior to Admission medications   Medication Sig Start Date End Date Taking? Authorizing Provider  ALPRAZolam (XANAX) 0.25 MG tablet Take 0.25 mg by mouth. 1 pill twice daily   Yes Historical Provider, MD  budesonide-formoterol (SYMBICORT) 160-4.5 MCG/ACT inhaler Inhale 2 puffs into the lungs 2 (two) times daily.   Yes Historical Provider, MD  buPROPion (WELLBUTRIN XL) 150 MG 24  hr tablet Take 150 mg by mouth daily.   Yes Historical Provider, MD  cyclobenzaprine (FLEXERIL) 10 MG tablet Take 10 mg by mouth 3 (three) times daily.    Yes Historical Provider, MD  diazepam (VALIUM) 5 MG tablet Take 5 mg by mouth at bedtime.    Yes Historical Provider, MD  diphenhydrAMINE (BENADRYL) 25 MG tablet Take 25 mg by mouth every 6 (six) hours as needed for allergies.   Yes Historical Provider, MD  DULoxetine (CYMBALTA) 60 MG capsule Take 60 mg by mouth daily.   Yes Historical Provider, MD  estradiol (ESTRACE) 1 MG tablet Take 1 mg by mouth daily.   Yes Historical Provider, MD  furosemide (LASIX) 40 MG tablet Take 40 mg by mouth daily.   Yes Historical Provider, MD  gemfibrozil (LOPID) 600 MG tablet Take 600 mg by mouth 2 (two) times daily before a meal.   Yes Historical Provider, MD   glipiZIDE (GLUCOTROL) 5 MG tablet Take 5 mg by mouth 2 (two) times daily before a meal.   Yes Historical Provider, MD  HYDROcodone-acetaminophen (NORCO/VICODIN) 5-325 MG per tablet Take 1 tablet by mouth every 8 (eight) hours as needed for moderate pain.   Yes Historical Provider, MD  Insulin Glargine 300 UNIT/ML SOPN Inject 50 Units into the skin every morning.   Yes Historical Provider, MD  metFORMIN (GLUCOPHAGE) 500 MG tablet Take 500 mg by mouth 2 (two) times daily with a meal.   Yes Historical Provider, MD  ondansetron (ZOFRAN-ODT) 4 MG disintegrating tablet  09/30/13  Yes Historical Provider, MD  tiotropium (SPIRIVA) 18 MCG inhalation capsule Place 18 mcg into inhaler and inhale daily.   Yes Historical Provider, MD  zolpidem (AMBIEN) 10 MG tablet Take 10 mg by mouth at bedtime.   Yes Historical Provider, MD  propranolol (INDERAL) 40 MG tablet Take 40 mg by mouth 2 (two) times daily.    Historical Provider, MD    Anti-infectives    Start     Dose/Rate Route Frequency Ordered Stop   08/26/14 1930  vancomycin (VANCOCIN) 500 mg in sodium chloride 0.9 % 100 mL IVPB     500 mg 100 mL/hr over 60 Minutes Intravenous Every 12 hours 08/26/14 1253 08/27/14 0919   08/26/14 0612  vancomycin (VANCOCIN) 1 GM/200ML IVPB    Comments:  Hallaji, Violet Ann: cabinet override      08/26/14 0612 08/26/14 1814   08/26/14 0600  vancomycin (VANCOCIN) IVPB 1000 mg/200 mL premix     1,000 mg 200 mL/hr over 60 Minutes Intravenous  Once 08/26/14 0559 08/26/14 0739      Scheduled Meds: . albuterol  2.5 mg Nebulization Q4H while awake  . antiseptic oral rinse  7 mL Mouth Rinse BID  . bisacodyl  10 mg Oral Daily  . DULoxetine  90 mg Oral Daily  . furosemide  40 mg Oral Daily  . gemfibrozil  600 mg Oral BID AC  . heparin subcutaneous  5,000 Units Subcutaneous Q12H  . insulin aspart  0-9 Units Subcutaneous TID WC  . naproxen  500 mg Oral BID WC  . propranolol  40 mg Oral BID   Continuous Infusions:  PRN  Meds:.morphine injection, ondansetron (ZOFRAN) IV, ondansetron, oxyCODONE-acetaminophen, oxyCODONE-acetaminophen  Allergies  Allergen Reactions  . Penicillins Anaphylaxis and Shortness Of Breath  . Avelox [Moxifloxacin Hcl In Nacl] Other (See Comments)    MUSCLE CRAMPS  . Vicodin [Hydrocodone-Acetaminophen] Nausea Only  . Neurontin [Gabapentin] Anxiety    Physical Exam  Vitals  Blood pressure 94/57, pulse 63, temperature 98.7 F (37.1 C), temperature source Oral, resp. rate 18, height '5\' 5"'$  (1.651 m), weight 93.5 kg (206 lb 2.1 oz), SpO2 94 %.   1. General  Alert,oriented, lying in bed in NAD,    2. Normal affect and insight, Not Suicidal or Homicidal, Awake Alert, Oriented X 3.  3. No F.N deficits, ALL C.Nerves Intact, Strength 5/5 all 4 extremities, Sensation intact all 4 extremities, Plantars down going.  4. Ears and Eyes appear Normal, Conjunctivae clear, PERRLA. Moist Oral Mucosa.  5. Supple Neck, No JVD, No cervical lymphadenopathy appriciated, No Carotid Bruits.  6. Symmetrical Chest wall movement, Good air movement bilaterally, CTAB.  7.lungs;clear,chest tube on right side ,breath sounds clear.no wheeze.  8. Positive Bowel Sounds, Abdomen Soft, No tenderness, No organomegaly appriciated,No rebound -guarding or rigidity.  9.  No Cyanosis, Normal Skin Turgor, No Skin Rash or Bruise.  10. Good muscle tone,  joints appear normal , no effusions, Normal ROM.  11. No Palpable Lymph Nodes in Neck or Axillae    Data Review  CBC  Recent Labs Lab 08/27/14 0407  WBC 10.6  HGB 11.4*  HCT 35.1  PLT 260  MCV 93.3  MCH 30.4  MCHC 32.6  RDW 14.1   ------------------------------------------------------------------------------------------------------------------  Chemistries   Recent Labs Lab 08/27/14 0407  NA 139  K 4.2  CL 106  CO2 25  GLUCOSE 229*  BUN 12  CREATININE 0.60  CALCIUM 8.9    ------------------------------------------------------------------------------------------------------------------ estimated creatinine clearance is 87.7 mL/min (by C-G formula based on Cr of 0.6). ------------------------------------------------------------------------------------------------------------------ No results for input(s): TSH, T4TOTAL, T3FREE, THYROIDAB in the last 72 hours.  Invalid input(s): FREET3   Coagulation profile No results for input(s): INR, PROTIME in the last 168 hours. ------------------------------------------------------------------------------------------------------------------- No results for input(s): DDIMER in the last 72 hours. -------------------------------------------------------------------------------------------------------------------  Cardiac Enzymes No results for input(s): CKMB, TROPONINI, MYOGLOBIN in the last 168 hours.  Invalid input(s): CK ------------------------------------------------------------------------------------------------------------------ Invalid input(s): POCBNP   ---------------------------------------------------------------------------------------------------------------  Urinalysis No results found for: COLORURINE, APPEARANCEUR, LABSPEC, PHURINE, GLUCOSEU, HGBUR, BILIRUBINUR, KETONESUR, PROTEINUR, UROBILINOGEN, NITRITE, LEUKOCYTESUR   Imaging results:   Ct Angio Chest Pe W/cm &/or Wo Cm  08/28/2014   CLINICAL DATA:  Lethargic and hypoxic.  EXAM: CT ANGIOGRAPHY CHEST WITH CONTRAST  TECHNIQUE: Multidetector CT imaging of the chest was performed using the standard protocol during bolus administration of intravenous contrast. Multiplanar CT image reconstructions and MIPs were obtained to evaluate the vascular anatomy.  CONTRAST:  138m OMNIPAQUE IOHEXOL 350 MG/ML SOLN  COMPARISON:  04/29/2014  FINDINGS: Negative for a pulmonary embolism. No evidence for aortic dissection. Ascending thoracic aorta roughly measures 3.3  cm. Trace amount of right pleural fluid. No significant pericardial effusion. No significant chest lymphadenopathy.  Surgical changes compatible with a right thoracotomy with surgical clips in the right lower lobe. Diffuse low density throughout the liver is suggestive for hepatic steatosis.  There is a right chest tube which terminates at the medial right lung apex. There is a trace amount of right pleural air at the right lung base and along the medial right lung. Patient has underlying emphysematous changes with blebs along the medial left upper lobe. There is consolidation and volume loss in the right lower lobe which is probably postsurgical. The spiculated lesion in the right lower lobe has been removed. There is a 5 mm nodule in the right lower lobe on sequence 6, image 60 which roughly measured 3 mm on the prior examination. There is volume  loss in the left lower lobe.  Small amount of subcutaneous gas in the chest. No acute bone abnormality. There is mild distention of the distal esophagus with debris.  Review of the MIP images confirms the above findings.  IMPRESSION: Negative for pulmonary embolism.  Postsurgical changes compatible with resection of the right lower lobe lesion. There is a 5 mm nodular density in the right lower lobe and recommend attention to this area on follow-up CT imaging.  Volume loss in the lower lobes bilaterally.  Right chest tube with a trace amount of right pleural air.  Emphysematous changes.   Electronically Signed   By: Markus Daft M.D.   On: 08/28/2014 18:18   Dg Chest Port 1 View  08/29/2014   CLINICAL DATA:  Video-assisted thoracotomy for malignancy, right-sided chest tube  EXAM: PORTABLE CHEST - 1 VIEW  COMPARISON:  CT scan and chest x-ray of Aug 28, 2014  FINDINGS: The lungs are slightly better inflated today. There is persistent bibasilar atelectasis. The right-sided chest tube tip projects over the posterior medial aspect of the right third rib. No definite  pneumothorax is demonstrated. The cardiac silhouette is mildly enlarged though stable. The pulmonary vascularity is not engorged.  IMPRESSION: Slight interval improvement in the appearance of the pulmonary interstitium. No definite residual pneumothorax. Stable right-sided chest tube.   Electronically Signed   By: David  Martinique M.D.   On: 08/29/2014 07:29   Dg Chest Port 1 View  08/28/2014   CLINICAL DATA:  Chest tube placement RIGHT side post VATS on 08/26/2014, shortness of breath, history hypertension, diabetes, COPD  EXAM: PORTABLE CHEST - 1 VIEW  COMPARISON:  Portable exam 1542 hr compared to earlier exam of 0607 hours  FINDINGS: RIGHT thoracostomy tube stable tip near RIGHT apex.  Skin clips lower RIGHT chest laterally.  Scattered a bedding artifacts.  Enlargement of cardiac silhouette.  Mediastinal contours and pulmonary vascularity normal.  Decreased lung volumes with RIGHT basilar atelectasis.  No gross pleural effusion or pneumothorax.  IMPRESSION: RIGHT basilar atelectasis.  No pneumothorax in patient with RIGHT thoracostomy tube.   Electronically Signed   By: Lavonia Dana M.D.   On: 08/28/2014 16:12   Dg Chest Port 1 View  08/28/2014   CLINICAL DATA:  Lung cancer.  Chest tube.  EXAM: PORTABLE CHEST - 1 VIEW  COMPARISON:  08/26/2014 .  FINDINGS: Right chest tube in stable position. Mediastinum hilar structures stable. Stable cardiomegaly. Persistent atelectasis and/or infiltrate right lower lobe. No pleural effusion or pneumothorax. Surgical staples over the right chest.  IMPRESSION: 1. Right chest tube in stable position. No pneumothorax. Surgical staples noted over the right chest. 2. Persistent atelectasis and/or infiltrate right lower lobe.   Electronically Signed   By: Marcello Moores  Register   On: 08/28/2014 07:41       Assessment & Plan  Active Problems:   Lung cancer    68. 59 year old female patient with shortness of breath, VATS for right lung mass. Medical consult placed for diabetes  and hypertension management . #1 hypertension; hypotensive now, hold her home BP medication namely propranolol.  #2 type 2 diabetes mellitus patient that metformin will be restarted today, hold the glipizide and Lantus. Patient takes glipizide 5 MG by mouth twice a day, Lantus 50 units at bedtime. But if her by mouth intake is good and sugars are better can resume Lantus at low dose, and I'll restart glipizide at 5 mg twice a day.from tomorrow if BG permits and her po intake  Improves.  COPD quit smoking continue loss. I'll, Symbicort. Depression patient is on now   Cymbalta 60 MG by mouth daily.  Pain secondary to chest tube placement continue pain medication.    DVT Prophylaxis Heparin  AM Labs Ordered, also please review Full Orders  Family Communication: Plan discussed with patient   Thank you for the consult, we will follow the patient with you in the Hospital.   [facility] M.D on 08/29/2014 at 5:45 PM

## 2014-08-30 LAB — GLUCOSE, CAPILLARY
GLUCOSE-CAPILLARY: 177 mg/dL — AB (ref 65–99)
GLUCOSE-CAPILLARY: 168 mg/dL — AB (ref 65–99)
Glucose-Capillary: 182 mg/dL — ABNORMAL HIGH (ref 65–99)
Glucose-Capillary: 163 mg/dL — ABNORMAL HIGH (ref 65–99)
Glucose-Capillary: 175 mg/dL — ABNORMAL HIGH (ref 65–99)

## 2014-08-30 LAB — BASIC METABOLIC PANEL
Anion gap: 11 (ref 5–15)
BUN: 21 mg/dL — ABNORMAL HIGH (ref 6–20)
CHLORIDE: 98 mmol/L — AB (ref 101–111)
CO2: 26 mmol/L (ref 22–32)
Calcium: 8.7 mg/dL — ABNORMAL LOW (ref 8.9–10.3)
Creatinine, Ser: 0.84 mg/dL (ref 0.44–1.00)
GFR calc Af Amer: >60 mL/min (ref >60)
GFR calc non Af Amer: >60 mL/min (ref >60)
Glucose, Bld: 191 mg/dL — ABNORMAL HIGH (ref 65–99)
Potassium: 3.2 mmol/L — ABNORMAL LOW (ref 3.5–5.1)
Sodium: 135 mmol/L (ref 135–145)

## 2014-08-30 LAB — PHOSPHORUS: PHOSPHORUS: 5 mg/dL — AB (ref 2.5–4.6)

## 2014-08-30 LAB — MAGNESIUM: Magnesium: 1.6 mg/dL — ABNORMAL LOW (ref 1.7–2.4)

## 2014-08-30 MED ORDER — ALBUTEROL SULFATE (2.5 MG/3ML) 0.083% IN NEBU
2.5000 mg | INHALATION_SOLUTION | Freq: Four times a day (QID) | RESPIRATORY_TRACT | Status: DC
  Administered 2014-08-30 – 2014-09-02 (×11): 2.5 mg via RESPIRATORY_TRACT
  Filled 2014-08-30 (×12): qty 3

## 2014-08-30 MED ORDER — MAGNESIUM SULFATE 2 GM/50ML IV SOLN
2.0000 g | Freq: Once | INTRAVENOUS | Status: AC
  Administered 2014-08-30: 2 g via INTRAVENOUS
  Filled 2014-08-30: qty 50

## 2014-08-30 MED ORDER — POTASSIUM CHLORIDE CRYS ER 20 MEQ PO TBCR
40.0000 meq | EXTENDED_RELEASE_TABLET | Freq: Once | ORAL | Status: AC
  Administered 2014-08-30: 40 meq via ORAL
  Filled 2014-08-30: qty 2

## 2014-08-30 NOTE — Progress Notes (Signed)
Surgery Progress Note  S: No acute issues,  Some pain, ambulating O: AF/VSS, good uop GEN: NAD/A&Ox3 CHEST: no air leak, some splinting, moving air well  A/P 59 yo s/p right thoracotomy with wedge resection - no acute issues - ambulate, pain control

## 2014-08-30 NOTE — Progress Notes (Signed)
SATURATION QUALIFICATIONS: (This note is used to comply with regulatory documentation for home oxygen)  Patient Saturations on Room Air while Ambulating = 78%  Patient Saturations on 2 Liters of oxygen while Ambulating = 84%  Recent Thoracotomy

## 2014-08-30 NOTE — Progress Notes (Signed)
Pt chest tube has been on water seal since the onset of the shift. Was reported that chest tube is on water seal.  Previous shift also reported that she had been told the chest tube is to water seal.  No order to confirm this.  Will follow up with surgeon in the morning

## 2014-08-30 NOTE — Progress Notes (Signed)
[facility] [place] NAME: Marie [name]    MR#:  [id number]  DATE OF BIRTH:  Oct 13, 1955  SUBJECTIVE:  CHIEF COMPLAINT:  No chief complaint on file.  No complaint. On O2NC. REVIEW OF SYSTEMS:  CONSTITUTIONAL: No fever, fatigue or weakness.  EYES: No blurred or double vision.  EARS, NOSE, AND THROAT: No tinnitus or ear pain.  RESPIRATORY: No cough, shortness of breath, wheezing or hemoptysis.  CARDIOVASCULAR: No chest pain, orthopnea, edema.  GASTROINTESTINAL: No nausea, vomiting, diarrhea or abdominal pain.  GENITOURINARY: No dysuria, hematuria.  ENDOCRINE: No polyuria, nocturia,  HEMATOLOGY: No anemia, easy bruising or bleeding SKIN: No rash or lesion. MUSCULOSKELETAL: No joint pain or arthritis.   NEUROLOGIC: No tingling, numbness, weakness.  PSYCHIATRY: No anxiety or depression.   DRUG ALLERGIES:   Allergies  Allergen Reactions  . Penicillins Anaphylaxis and Shortness Of Breath  . Avelox [Moxifloxacin Hcl In Nacl] Other (See Comments)    MUSCLE CRAMPS  . Vicodin [Hydrocodone-Acetaminophen] Nausea Only  . Neurontin [Gabapentin] Anxiety    VITALS:  Blood pressure 92/52, pulse 77, temperature 97.6 F (36.4 C), temperature source Oral, resp. rate 17, height '5\' 5"'$  (1.651 m), weight 93.5 kg (206 lb 2.1 oz), SpO2 78 %.  PHYSICAL EXAMINATION:  GENERAL:  59 y.o.-year-old patient lying in the bed with no acute distress.  EYES: Pupils equal, round, reactive to light and accommodation. No scleral icterus. Extraocular muscles intact.  HEENT: Head atraumatic, normocephalic. Oropharynx and nasopharynx clear.  NECK:  Supple, no jugular venous distention. No thyroid enlargement, no tenderness.  LUNGS: Normal breath sounds bilaterally, no wheezing, rales,rhonchi or crepitation. No use of accessory muscles of respiration. On chest tube in situ. CARDIOVASCULAR: S1, S2 normal. No murmurs, rubs, or gallops.  ABDOMEN: Soft, nontender,  nondistended. Bowel sounds present. No organomegaly or mass.  EXTREMITIES: No pedal edema, cyanosis, or clubbing.  NEUROLOGIC: Cranial nerves II through XII are intact. Muscle strength 5/5 in all extremities. Sensation intact. Gait not checked.  PSYCHIATRIC: The patient is alert and oriented x 3.  SKIN: No obvious rash, lesion, or ulcer.    LABORATORY PANEL:   CBC  Recent Labs Lab 08/27/14 0407  WBC 10.6  HGB 11.4*  HCT 35.1  PLT 260   ------------------------------------------------------------------------------------------------------------------  Chemistries   Recent Labs Lab 08/30/14 0527  NA 135  K 3.2*  CL 98*  CO2 26  GLUCOSE 191*  BUN 21*  CREATININE 0.84  CALCIUM 8.7*  MG 1.6*   ------------------------------------------------------------------------------------------------------------------  Cardiac Enzymes No results for input(s): TROPONINI in the last 168 hours. ------------------------------------------------------------------------------------------------------------------  RADIOLOGY:  Ct Angio Chest Pe W/cm &/or Wo Cm  08/28/2014   CLINICAL DATA:  Lethargic and hypoxic.  EXAM: CT ANGIOGRAPHY CHEST WITH CONTRAST  TECHNIQUE: Multidetector CT imaging of the chest was performed using the standard protocol during bolus administration of intravenous contrast. Multiplanar CT image reconstructions and MIPs were obtained to evaluate the vascular anatomy.  CONTRAST:  192m OMNIPAQUE IOHEXOL 350 MG/ML SOLN  COMPARISON:  04/29/2014  FINDINGS: Negative for a pulmonary embolism. No evidence for aortic dissection. Ascending thoracic aorta roughly measures 3.3 cm. Trace amount of right pleural fluid. No significant pericardial effusion. No significant chest lymphadenopathy.  Surgical changes compatible with a right thoracotomy with surgical clips in the right lower lobe. Diffuse low density throughout the liver is suggestive for hepatic steatosis.  There is a right chest  tube which terminates at the medial right lung apex. There is a trace  amount of right pleural air at the right lung base and along the medial right lung. Patient has underlying emphysematous changes with blebs along the medial left upper lobe. There is consolidation and volume loss in the right lower lobe which is probably postsurgical. The spiculated lesion in the right lower lobe has been removed. There is a 5 mm nodule in the right lower lobe on sequence 6, image 60 which roughly measured 3 mm on the prior examination. There is volume loss in the left lower lobe.  Small amount of subcutaneous gas in the chest. No acute bone abnormality. There is mild distention of the distal esophagus with debris.  Review of the MIP images confirms the above findings.  IMPRESSION: Negative for pulmonary embolism.  Postsurgical changes compatible with resection of the right lower lobe lesion. There is a 5 mm nodular density in the right lower lobe and recommend attention to this area on follow-up CT imaging.  Volume loss in the lower lobes bilaterally.  Right chest tube with a trace amount of right pleural air.  Emphysematous changes.   Electronically Signed   By: Markus Daft M.D.   On: 08/28/2014 18:18   Dg Chest Port 1 View  08/29/2014   CLINICAL DATA:  Video-assisted thoracotomy for malignancy, right-sided chest tube  EXAM: PORTABLE CHEST - 1 VIEW  COMPARISON:  CT scan and chest x-ray of Aug 28, 2014  FINDINGS: The lungs are slightly better inflated today. There is persistent bibasilar atelectasis. The right-sided chest tube tip projects over the posterior medial aspect of the right third rib. No definite pneumothorax is demonstrated. The cardiac silhouette is mildly enlarged though stable. The pulmonary vascularity is not engorged.  IMPRESSION: Slight interval improvement in the appearance of the pulmonary interstitium. No definite residual pneumothorax. Stable right-sided chest tube.   Electronically Signed   By: David   Martinique M.D.   On: 08/29/2014 07:29   Dg Chest Port 1 View  08/28/2014   CLINICAL DATA:  Chest tube placement RIGHT side post VATS on 08/26/2014, shortness of breath, history hypertension, diabetes, COPD  EXAM: PORTABLE CHEST - 1 VIEW  COMPARISON:  Portable exam 1542 hr compared to earlier exam of 0607 hours  FINDINGS: RIGHT thoracostomy tube stable tip near RIGHT apex.  Skin clips lower RIGHT chest laterally.  Scattered a bedding artifacts.  Enlargement of cardiac silhouette.  Mediastinal contours and pulmonary vascularity normal.  Decreased lung volumes with RIGHT basilar atelectasis.  No gross pleural effusion or pneumothorax.  IMPRESSION: RIGHT basilar atelectasis.  No pneumothorax in patient with RIGHT thoracostomy tube.   Electronically Signed   By: Lavonia Dana M.D.   On: 08/28/2014 16:12    EKG:  No orders found for this or any previous visit.  ASSESSMENT AND PLAN:  61. 59 year old female patient with shortness of breath, VATS for right lung mass.  . #1 hypertension: hypotension improved but BP is still in low side, hold her home BP medication namely propranolol.  #2 type 2 diabetes mellitus : on metformin, hold the glipizide and Lantus. Patient takes glipizide 5 MG by mouth twice a day, Lantus 50 units at bedtime. But if her by mouth intake is good and sugars are better can resume Lantus at low dose,   COPD:  Stable, cont Symbicort.   Depression: on cymbalta 60 MG by mouth daily.  Hypokalemia: KCl, f/u BMP. Hypomagnesemia: mag, f/u .level.       All the records are reviewed and case discussed with Care Management/Social Workerr.  Management plans discussed with the patient, family and they are in agreement.  CODE STATUS: FULL CODE.  TOTAL TIME TAKING CARE OF THIS PATIENT: 41 minutes.   POSSIBLE D/C IN 3 DAYS, DEPENDING ON CLINICAL CONDITION.   Demetrios Loll M.D on 08/30/2014 at 1:21 PM  Between 7am to 6pm - Pager - [phone]  After 6pm go to [email or web address] - password [email or web address]  [facility]  Office  [phone]  CC: Primary care physician; Cletis Athens, MD

## 2014-08-30 NOTE — Evaluation (Signed)
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  Clinical Impression  Pt presents with decreased functional mobility and increased O2 demands following R LL wedge resection.  Pt is modified independent with bed mobility and transfers and is able to ambulate 200' on 2 L O2 with SBA.  Pt O2 sats drop to 84% at end of ambulation session.  Pt has equipment at home to use at her disposal if needed and has multiple adult children and boyfriend at home to assist with her post discharge care.  Pt would benefit from PT to address objective findings.    Follow Up Recommendations No PT follow up    Equipment Recommendations       Recommendations for Other Services       Precautions / Restrictions Precautions Precautions: None Restrictions Other Position/Activity Restrictions: chest tube, O2      Mobility  Bed Mobility Overal bed mobility: Modified Independent             General bed mobility comments: increased time, HOB elvated and use of bed rails  Transfers Overall transfer level: Modified independent Equipment used: None             General transfer comment: pushing up with R UE on bed rail, increased pain  Ambulation/Gait Ambulation/Gait assistance: Modified independent (Device/Increase time) Ambulation Distance (Feet): 200 Feet Assistive device: None Gait Pattern/deviations: Trunk flexed;Antalgic     General Gait Details: decreased gait speed on 2 L O2, focus on breathing and steady cadence  Stairs            Wheelchair Mobility    Modified Rankin (Stroke Patients Only)       Balance Overall balance assessment: No apparent balance deficits (not formally assessed)                                           Pertinent Vitals/Pain  Pain Assessment: 0-10 Pain Score: 9  Pain Descriptors / Indicators: Constant Pain Intervention(s): Limited activity within patient's tolerance;Monitored during session;Premedicated before session    Home Living Family/patient expects to be discharged to:: Private residence Living Arrangements: Spouse/significant other;Children Available Help at Discharge: Family Type of Home: House Home Access: Stairs to enter Entrance Stairs-Rails: None Entrance Stairs-Number of Steps: 4-5 Home Layout: One level Home Equipment: Environmental consultant - 2 wheels;Cane - single point;Bedside commode;Wheelchair - manual Additional Comments: did not need device for ambulation prior to admission    Prior Function Level of Independence: Independent               Hand Dominance        Extremity/Trunk Assessment   Upper Extremity Assessment: Overall WFL for tasks assessed           Lower Extremity Assessment: Overall WFL for tasks assessed         Communication   Communication: No difficulties  Cognition Arousal/Alertness: Awake/alert Behavior During Therapy: WFL for tasks assessed/performed Overall Cognitive Status: Within Functional Limits for tasks assessed                      General Comments General comments (skin integrity, edema, etc.): R throacic surgical bandage with chest tube and drainage on gown/sheets  Exercises        Assessment/Plan    PT Assessment Patient needs continued PT services  PT Diagnosis Acute pain;Difficulty walking   PT Problem List Decreased activity tolerance;Decreased mobility;Cardiopulmonary status limiting activity  PT Treatment Interventions Gait training;Stair training;Functional mobility training;Therapeutic activities;Therapeutic exercise   PT Goals (Current goals can be found in the Care Plan section) Acute Rehab PT Goals Patient Stated Goal: To go home. PT Goal Formulation: With patient Time For Goal Achievement: 09/06/14 Potential to  Achieve Goals: Good    Frequency Min 2X/week   Barriers to discharge        Co-evaluation               End of Session Equipment Utilized During Treatment: Oxygen Activity Tolerance:  (increased O2 demand with mobility) Patient left: in bed Nurse Communication: Mobility status         Time: 7530-0511 PT Time Calculation (min) (ACUTE ONLY): 28 min   Charges:   PT Evaluation $Initial PT Evaluation Tier I: 1 Procedure PT Treatments $Therapeutic Exercise: 8-22 mins   PT G Codes:        Trevonn Hallum A Emidio Warrell 07-Sep-2014, 12:12 PM

## 2014-08-31 LAB — BASIC METABOLIC PANEL
ANION GAP: 8 (ref 5–15)
BUN: 20 mg/dL (ref 6–20)
CALCIUM: 8.7 mg/dL — AB (ref 8.9–10.3)
CO2: 29 mmol/L (ref 22–32)
CREATININE: 0.86 mg/dL (ref 0.44–1.00)
Chloride: 101 mmol/L (ref 101–111)
GFR calc non Af Amer: >60 mL/min (ref >60)
Glucose, Bld: 207 mg/dL — ABNORMAL HIGH (ref 65–99)
Potassium: 3.4 mmol/L — ABNORMAL LOW (ref 3.5–5.1)
Sodium: 138 mmol/L (ref 135–145)

## 2014-08-31 LAB — CBC
HEMATOCRIT: 35.2 % (ref 35.0–47.0)
HEMOGLOBIN: 11.8 g/dL — AB (ref 12.0–16.0)
MCH: 30.6 pg (ref 26.0–34.0)
MCHC: 33.4 g/dL (ref 32.0–36.0)
MCV: 91.8 fL (ref 80.0–100.0)
PLATELETS: 285 10*3/uL (ref 150–440)
RBC: 3.83 MIL/uL (ref 3.80–5.20)
RDW: 13.7 % (ref 11.5–14.5)
WBC: 7.2 10*3/uL (ref 3.6–11.0)

## 2014-08-31 LAB — TYPE AND SCREEN
ABO/RH(D): B POS
ANTIBODY SCREEN: NEGATIVE
Unit division: 0
Unit division: 0

## 2014-08-31 LAB — GLUCOSE, CAPILLARY
GLUCOSE-CAPILLARY: 243 mg/dL — AB (ref 65–99)
GLUCOSE-CAPILLARY: 198 mg/dL — AB (ref 65–99)
Glucose-Capillary: 193 mg/dL — ABNORMAL HIGH (ref 65–99)
Glucose-Capillary: 198 mg/dL — ABNORMAL HIGH (ref 65–99)

## 2014-08-31 LAB — MAGNESIUM: Magnesium: 1.9 mg/dL (ref 1.7–2.4)

## 2014-08-31 MED ORDER — POTASSIUM CHLORIDE CRYS ER 20 MEQ PO TBCR
40.0000 meq | EXTENDED_RELEASE_TABLET | Freq: Once | ORAL | Status: AC
  Administered 2014-08-31: 40 meq via ORAL
  Filled 2014-08-31: qty 2

## 2014-08-31 MED ORDER — INSULIN GLARGINE 100 UNIT/ML ~~LOC~~ SOLN
10.0000 [IU] | Freq: Every day | SUBCUTANEOUS | Status: DC
  Administered 2014-08-31 – 2014-09-01 (×2): 10 [IU] via SUBCUTANEOUS
  Filled 2014-08-31 (×3): qty 0.1

## 2014-08-31 NOTE — Progress Notes (Signed)
Surgery Progress Note  S: Ambulating, no acute issues O: AF/VSS, good uop GEN: NAD/A&Ox3 RESP: No air leak  A/P 59 yo s/p right wedge resection of adenocarcinoma, doing well - no acute changes, likely chest tube out soon

## 2014-08-31 NOTE — Progress Notes (Signed)
[facility] [place] NAME: Marie [name]    MR#:  [id number]  DATE OF BIRTH:  1955-06-22  SUBJECTIVE:  CHIEF COMPLAINT:  No chief complaint on file.  No complaint. On O2NC. REVIEW OF SYSTEMS:  CONSTITUTIONAL: No fever, fatigue or weakness.  EYES: No blurred or double vision.  EARS, NOSE, AND THROAT: No tinnitus or ear pain.  RESPIRATORY: No cough, shortness of breath, wheezing or hemoptysis. CARDIOVASCULAR: No chest pain, orthopnea, edema.  GASTROINTESTINAL: No nausea, vomiting, diarrhea or abdominal pain.  GENITOURINARY: No dysuria, hematuria.  ENDOCRINE: No polyuria, nocturia,  HEMATOLOGY: No anemia, easy bruising or bleeding SKIN: No rash or lesion. MUSCULOSKELETAL: No joint pain or arthritis.   NEUROLOGIC: No tingling, numbness, weakness.  PSYCHIATRY: No anxiety or depression.   DRUG ALLERGIES:   Allergies  Allergen Reactions  . Penicillins Anaphylaxis and Shortness Of Breath  . Avelox [Moxifloxacin Hcl In Nacl] Other (See Comments)    MUSCLE CRAMPS  . Vicodin [Hydrocodone-Acetaminophen] Nausea Only  . Neurontin [Gabapentin] Anxiety    VITALS:  Blood pressure 81/44, pulse 72, temperature 97.9 F (36.6 C), temperature source Oral, resp. rate 18, height '5\' 5"'$  (1.651 m), weight 93.5 kg (206 lb 2.1 oz), SpO2 99 %.  PHYSICAL EXAMINATION:  GENERAL:  59 y.o.-year-old patient lying in the bed with no acute distress.  EYES: Pupils equal, round, reactive to light and accommodation. No scleral icterus. Extraocular muscles intact.  HEENT: Head atraumatic, normocephalic. Oropharynx and nasopharynx clear.  NECK:  Supple, no jugular venous distention. No thyroid enlargement, no tenderness.  LUNGS: Normal breath sounds bilaterally, no wheezing, rales,rhonchi or crepitation. No use of accessory muscles of respiration.  The chest acute in situ with a bloody drainage. CARDIOVASCULAR: S1, S2 normal. No murmurs, rubs, or gallops.   ABDOMEN: Soft, nontender, nondistended. Bowel sounds present. No organomegaly or mass.  EXTREMITIES: No pedal edema, cyanosis, or clubbing.  NEUROLOGIC: Cranial nerves II through XII are intact. Muscle strength 5/5 in all extremities. Sensation intact. Gait not checked.  PSYCHIATRIC: The patient is alert and oriented x 3.  SKIN: No obvious rash, lesion, or ulcer.    LABORATORY PANEL:   CBC  Recent Labs Lab 08/31/14 0352  WBC 7.2  HGB 11.8*  HCT 35.2  PLT 285   ------------------------------------------------------------------------------------------------------------------  Chemistries   Recent Labs Lab 08/31/14 0352  NA 138  K 3.4*  CL 101  CO2 29  GLUCOSE 207*  BUN 20  CREATININE 0.86  CALCIUM 8.7*  MG 1.9   ------------------------------------------------------------------------------------------------------------------  Cardiac Enzymes No results for input(s): TROPONINI in the last 168 hours. ------------------------------------------------------------------------------------------------------------------  RADIOLOGY:  No results found.  EKG:  No orders found for this or any previous visit.  ASSESSMENT AND PLAN:  33. 59 year old female patient with shortness of breath, VATS for right lung mass.  . #1 hypertension: hypotension improved but BP is still in low side, hold her home BP medication propranolol. I advised the patient to avoid medication if possible due to low blood pressure.  #2 type 2 diabetes mellitus : Blood sugars is stable and controlled, continue sliding scale. 8 Lantus 10 units subcutaneous at bedtime.  COPD:  Stable, cont Symbicort.   Depression: on cymbalta 60 MG by mouth daily.  Hypokalemia: KCl, f/u BMP. Hypomagnesemia: Improved after treatment     All the records are reviewed and case discussed with Care Management/Social Workerr. Management plans discussed with the patient, family and they are in agreement.  CODE STATUS:  FULL  CODE.  TOTAL TIME TAKING CARE OF THIS PATIENT: 38 minutes.   POSSIBLE D/C IN 2 DAYS, DEPENDING ON CLINICAL CONDITION.   Demetrios Loll M.D on 08/31/2014 at 2:45 PM  Between 7am to 6pm - Pager - [phone]  After 6pm go to [email or web address] - password [email or web address] [facility]  Office  [phone]  CC: Primary care physician; Cletis Athens, MD

## 2014-09-01 LAB — BASIC METABOLIC PANEL
Anion gap: 6 (ref 5–15)
BUN: 16 mg/dL (ref 6–20)
CALCIUM: 8.8 mg/dL — AB (ref 8.9–10.3)
CO2: 29 mmol/L (ref 22–32)
Chloride: 102 mmol/L (ref 101–111)
Creatinine, Ser: 0.75 mg/dL (ref 0.44–1.00)
GFR calc Af Amer: >60 mL/min (ref >60)
GFR calc non Af Amer: >60 mL/min (ref >60)
Glucose, Bld: 166 mg/dL — ABNORMAL HIGH (ref 65–99)
Potassium: 4 mmol/L (ref 3.5–5.1)
Sodium: 137 mmol/L (ref 135–145)

## 2014-09-01 LAB — GLUCOSE, CAPILLARY
Glucose-Capillary: 160 mg/dL — ABNORMAL HIGH (ref 65–99)
Glucose-Capillary: 155 mg/dL — ABNORMAL HIGH (ref 65–99)
Glucose-Capillary: 135 mg/dL — ABNORMAL HIGH (ref 65–99)
Glucose-Capillary: 151 mg/dL — ABNORMAL HIGH (ref 65–99)

## 2014-09-01 NOTE — Care Management Note (Addendum)
Case Management Note  Patient Details  Name: Marie [name] MRN: [id number] Date of Birth: [birth year]  Subjective/Objective:                   Spoke with patient for discharge planning. Patient is from home with boyfriend and 2 adult sons.  Stated that O2 is delivered to the home, provider unknown. Both her and her boyfriend use O2., Has concentrator and extra tanks. Boyfriend and sons take her to her appointments. Has walker and cane and stated to me that she was independent. Stated that she drives prior to hospitalization. Affords medications. Anticipate discharge tomorrow with no needs if chest tube out. Patient has been up to restroom without assistance.  PT notes stated patient ambulated over 200 feet without assistance.  Action/Plan:   Expected Discharge Date:                  Expected Discharge Plan:  Home/Self Care  In-House Referral:     Discharge planning Services  CM Consult  Post Acute Care Choice:    Choice offered to:  NA  DME Arranged:    DME Agency:     HH Arranged:  NA HH Agency:     Status of Service:  In process, will continue to follow  Medicare Important Message Given:  Yes Date Medicare IM Given:  09/01/14 Medicare IM give by:  Theodoro Kalata NCM Date Additional Medicare IM Given:    Additional Medicare Important Message give by:     If discussed at [place] of Stay Meetings, dates discussed:    Additional Comments:  Alvie Heidelberg, RN 09/01/2014, 9:38 AM

## 2014-09-01 NOTE — Progress Notes (Signed)
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    PT Comments    Pt has met all therapy goals. Pt safe to ambulate with independence with no LOB noted. Pt performed safe stair training and demonstrates good cadence with gait training. All mobility performed on room air, however O2 sats decreased to 85% quickly improving with O2 donned to 91%. No SOB symptoms noted. Pt does not require any further skilled PT services at this time. Will dc in house. Pt safe to continue ambulation with RN/family.   Follow Up Recommendations  No PT follow up     Equipment Recommendations       Recommendations for Other Services       Precautions / Restrictions Precautions Precautions: None Restrictions Weight Bearing Restrictions: No    Mobility  Bed Mobility Overal bed mobility: Independent                Transfers Overall transfer level: Independent               General transfer comment: safe technique performed for standing. Pt able to stand and don gown with independence  Ambulation/Gait Ambulation/Gait assistance: Independent Ambulation Distance (Feet): 275 Feet Assistive device: None       General Gait Details: ambulated with reciprocal gait pattern and good cadence. Safe technique performed. Occasionally reaching for rail. All ambulation performed on room air   Stairs Stairs: Yes Stairs assistance: Independent Stair Management: One rail Left Number of Stairs: 6 General stair comments: up/down with step-over pattern. Safe technique performed with independence  Wheelchair Mobility    Modified Rankin (Stroke Patients Only)       Balance Overall balance assessment: No apparent balance deficits (not formally assessed)                                   Cognition Arousal/Alertness: Awake/alert Behavior During Therapy: WFL for tasks assessed/performed Overall Cognitive Status: Within Functional Limits for tasks assessed                      Exercises      General Comments General comments (skin integrity, edema, etc.): R throacic surgical bandage with chest tube and drainage on gown/sheets      Pertinent Vitals/Pain Pain Assessment: No/denies pain    Home Living                      Prior Function            PT Goals (current goals can now be found in the care plan section) Acute Rehab PT Goals Patient Stated Goal: To go home. PT Goal Formulation: With patient Time For Goal Achievement: 09/06/14 Potential to Achieve Goals: Good Progress towards PT goals: Progressing toward goals    Frequency  Min 2X/week    PT Plan Current plan remains appropriate    Co-evaluation             End of Session   Activity Tolerance: Patient tolerated treatment well;Patient limited by fatigue Patient left: in bed     Time: 1500-1510 PT Time Calculation (min) (ACUTE ONLY): 10 min  Charges:  $Gait Training: 8-22 mins  G CodesGreggory Stallion, PT, DPT [phone]  Glorianne Proctor 09/01/2014, 3:44 PM

## 2014-09-01 NOTE — Progress Notes (Signed)
Wound care on ulcer on bottom of left foot preformed.

## 2014-09-01 NOTE — Progress Notes (Signed)
Marie [name] NAME: Marie [name]    MR#:  [id number]  DATE OF BIRTH:  [birth year]  SUBJECTIVE:  CHIEF COMPLAINT:  No chief complaint on file.  No complaint.  REVIEW OF SYSTEMS:  CONSTITUTIONAL: No fever, fatigue or weakness.  EYES: No blurred or double vision.  EARS, NOSE, AND THROAT: No tinnitus or ear pain.  RESPIRATORY: No cough, shortness of breath, wheezing or hemoptysis. CARDIOVASCULAR: No chest pain, orthopnea, edema.  GASTROINTESTINAL: No nausea, vomiting, diarrhea or abdominal pain.  GENITOURINARY: No dysuria, hematuria.  ENDOCRINE: No polyuria, nocturia,  HEMATOLOGY: No anemia, easy bruising or bleeding SKIN: No rash or lesion. MUSCULOSKELETAL: No joint pain or arthritis.   NEUROLOGIC: No tingling, numbness, weakness.  PSYCHIATRY: No anxiety or depression.   DRUG ALLERGIES:   Allergies  Allergen Reactions  . Penicillins Anaphylaxis and Shortness Of Breath  . Avelox [Moxifloxacin Hcl In Nacl] Other (See Comments)    MUSCLE CRAMPS  . Vicodin [Hydrocodone-Acetaminophen] Nausea Only  . Neurontin [Gabapentin] Anxiety    VITALS:  Blood pressure 108/66, pulse 68, temperature 97.5 F (36.4 C), temperature source Oral, resp. rate 18, height '5\' 5"'$  (1.651 m), weight 93.5 kg (206 lb 2.1 oz), SpO2 97 %.  PHYSICAL EXAMINATION:  GENERAL:  59 y.o.-year-old patient lying in the bed with no acute distress.  EYES: Pupils equal, round, reactive to light and accommodation. No scleral icterus. Extraocular muscles intact.  HEENT: Head atraumatic, normocephalic. Oropharynx and nasopharynx clear.  NECK:  Supple, no jugular venous distention. No thyroid enlargement, no tenderness.  LUNGS: Normal breath sounds bilaterally, no wheezing, rales,rhonchi or crepitation. No use of accessory muscles of respiration.  The chest acute in situ with clear drainage. CARDIOVASCULAR: S1, S2 normal. No murmurs, rubs, or gallops.  ABDOMEN: Soft,  nontender, nondistended. Bowel sounds present. No organomegaly or mass.  EXTREMITIES: No pedal edema, cyanosis, or clubbing.  NEUROLOGIC: Cranial nerves II through XII are intact. Muscle strength 5/5 in all extremities. Sensation intact. Gait not checked.  PSYCHIATRIC: The patient is alert and oriented x 3.  SKIN: No obvious rash, lesion, or ulcer.    LABORATORY PANEL:   CBC  Recent Labs Lab 08/31/14 0352  WBC 7.2  HGB 11.8*  HCT 35.2  PLT 285   ------------------------------------------------------------------------------------------------------------------  Chemistries   Recent Labs Lab 08/31/14 0352 09/01/14 0446  NA 138 137  K 3.4* 4.0  CL 101 102  CO2 29 29  GLUCOSE 207* 166*  BUN 20 16  CREATININE 0.86 0.75  CALCIUM 8.7* 8.8*  MG 1.9  --    ------------------------------------------------------------------------------------------------------------------  Cardiac Enzymes No results for input(s): TROPONINI in the last 168 hours. ------------------------------------------------------------------------------------------------------------------  RADIOLOGY:  No results found.  EKG:  No orders found for this or any previous visit.  ASSESSMENT AND PLAN:  74. 59 year old female patient with shortness of breath, VATS for right lung mass.  . #1 hypertension: BP is still in low side, hold her home BP medication propranolol. #2 type 2 diabetes mellitus : Blood sugars is stable and controlled, continue sliding scale and Lantus 10 units subcutaneous at bedtime.  COPD:  Stable, cont Symbicort.   Depression: on cymbalta 60 MG by mouth daily.  Hypokalemia: Improved after treatment  Hypomagnesemia: Improved after treatment Lung cancer. Status post surgery. Follow-up surgeon for removal of chest tube. The pathology shows adenocarcinoma of the lung.     All the records are reviewed and case discussed with Care Management/Social Workerr. Management plans discussed  with the patient, family and they are in agreement.  CODE STATUS: FULL CODE.  TOTAL TIME TAKING CARE OF THIS PATIENT: 33 minutes.   POSSIBLE D/C TOMORROW BY SURGEON, DEPENDING ON CLINICAL CONDITION.   Demetrios Loll M.D on 09/01/2014 at 2:23 PM  Between 7am to 6pm - Pager - [phone]  After 6pm go to [email or web address] - password [email or web address] [facility]  Office  [phone]  CC: Primary care physician; Cletis Athens, MD

## 2014-09-02 ENCOUNTER — Inpatient Hospital Stay: Payer: Medicare Other

## 2014-09-02 ENCOUNTER — Telehealth: Payer: Self-pay | Admitting: *Deleted

## 2014-09-02 LAB — GLUCOSE, CAPILLARY
Glucose-Capillary: 161 mg/dL — ABNORMAL HIGH (ref 65–99)
Glucose-Capillary: 149 mg/dL — ABNORMAL HIGH (ref 65–99)
Glucose-Capillary: 141 mg/dL — ABNORMAL HIGH (ref 65–99)

## 2014-09-02 MED ORDER — MEPERIDINE HCL 25 MG/ML IJ SOLN
INTRAMUSCULAR | Status: AC
  Filled 2014-09-02: qty 1

## 2014-09-02 MED ORDER — HYDROCODONE-ACETAMINOPHEN 5-325 MG PO TABS: 1.0000 | ORAL_TABLET | ORAL | Status: DC | PRN

## 2014-09-02 MED ORDER — MEPERIDINE HCL 25 MG/ML IJ SOLN
25.0000 mg | INTRAMUSCULAR | Status: DC | PRN
  Administered 2014-09-02 (×3): 25 mg via INTRAMUSCULAR
  Filled 2014-09-02 (×2): qty 1

## 2014-09-02 MED ORDER — INSULIN GLARGINE 100 UNIT/ML ~~LOC~~ SOLN: 15.0000 [IU] | Freq: Every day | SUBCUTANEOUS | Status: DC

## 2014-09-02 NOTE — Progress Notes (Signed)
[facility] [place] NAME: Marie [name]    MR#:  [id number]  DATE OF BIRTH:  03-17-1956  SUBJECTIVE:  CHIEF COMPLAINT:  No chief complaint on file.  No complaint. REVIEW OF SYSTEMS:  CONSTITUTIONAL: No fever, fatigue or weakness.  EYES: No blurred or double vision.  EARS, NOSE, AND THROAT: No tinnitus or ear pain.  RESPIRATORY: No cough, shortness of breath, wheezing or hemoptysis. CARDIOVASCULAR: No chest pain, orthopnea, edema.  GASTROINTESTINAL: No nausea, vomiting, diarrhea or abdominal pain.  GENITOURINARY: No dysuria, hematuria.  ENDOCRINE: No polyuria, nocturia,  HEMATOLOGY: No anemia, easy bruising or bleeding SKIN: No rash or lesion. MUSCULOSKELETAL: No joint pain or arthritis.   NEUROLOGIC: No tingling, numbness, weakness.  PSYCHIATRY: No anxiety or depression.   DRUG ALLERGIES:   Allergies  Allergen Reactions  . Penicillins Anaphylaxis and Shortness Of Breath  . Avelox [Moxifloxacin Hcl In Nacl] Other (See Comments)    MUSCLE CRAMPS  . Vicodin [Hydrocodone-Acetaminophen] Nausea Only  . Neurontin [Gabapentin] Anxiety    VITALS:  Blood pressure 110/59, pulse 71, temperature 97.7 F (36.5 C), temperature source Oral, resp. rate 18, height '5\' 5"'$  (1.651 m), weight 93.5 kg (206 lb 2.1 oz), SpO2 94 %.  PHYSICAL EXAMINATION:  GENERAL:  59 y.o.-year-old patient lying in the bed with no acute distress.  EYES: Pupils equal, round, reactive to light and accommodation. No scleral icterus. Extraocular muscles intact.  HEENT: Head atraumatic, normocephalic. Oropharynx and nasopharynx clear.  NECK:  Supple, no jugular venous distention. No thyroid enlargement, no tenderness.  LUNGS: Normal breath sounds bilaterally, no wheezing, rales,rhonchi or crepitation. No use of accessory muscles of respiration.  The chest tube was removed this am. CARDIOVASCULAR: S1, S2 normal. No murmurs, rubs, or gallops.  ABDOMEN: Soft, nontender,  nondistended. Bowel sounds present. No organomegaly or mass.  EXTREMITIES: No pedal edema, cyanosis, or clubbing.  NEUROLOGIC: Cranial nerves II through XII are intact. Muscle strength 5/5 in all extremities. Sensation intact. Gait not checked.  PSYCHIATRIC: The patient is alert and oriented x 3.  SKIN: No obvious rash, lesion, or ulcer.    LABORATORY PANEL:   CBC  Recent Labs Lab 08/31/14 0352  WBC 7.2  HGB 11.8*  HCT 35.2  PLT 285   ------------------------------------------------------------------------------------------------------------------  Chemistries   Recent Labs Lab 08/31/14 0352 09/01/14 0446  NA 138 137  K 3.4* 4.0  CL 101 102  CO2 29 29  GLUCOSE 207* 166*  BUN 20 16  CREATININE 0.86 0.75  CALCIUM 8.7* 8.8*  MG 1.9  --    ------------------------------------------------------------------------------------------------------------------  Cardiac Enzymes No results for input(s): TROPONINI in the last 168 hours. ------------------------------------------------------------------------------------------------------------------  RADIOLOGY:  Dg Chest 2 View  09/02/2014   CLINICAL DATA:  Current history of lung cancer.  EXAM: CHEST  2 VIEW  COMPARISON:  Same day.  FINDINGS: Stable cardiomediastinal silhouette. Right-sided chest tube has been removed. Minimal right apical pneumothorax is noted. Postsurgical changes are seen in the right basilar region probable subsegmental atelectasis is noted in left lung base.  IMPRESSION: Minimal right apical pneumothorax is noted status post right-sided chest tube removal.   Electronically Signed   By: Marijo Conception, M.D.   On: 09/02/2014 10:53   Dg Chest Port 1 View  09/02/2014   CLINICAL DATA:  Lung cancer  EXAM: PORTABLE CHEST - 1 VIEW  COMPARISON:  08/29/2014  FINDINGS: Right chest tube remains in place, unchanged. No pneumothorax. Further improvement in aeration with decreasing bibasilar  opacities and interstitial  prominence. Minimal residual bibasilar atelectasis or infiltrates. Heart is upper limits normal in size. No acute bony abnormality.  IMPRESSION: Further improvement in aeration. Minimal residual bibasilar atelectasis or infiltrates. No pneumothorax with right chest tube in stable position.   Electronically Signed   By: Rolm Baptise M.D.   On: 09/02/2014 09:38    EKG:  No orders found for this or any previous visit.  ASSESSMENT AND PLAN:  47. 59 year old female patient with shortness of breath, VATS for right lung mass.  . #1 hypertension: hypotension improved but BP is still in low side, hold her home BP medication propranolol.  Avoid pain medication if possible due to low blood pressure.  #2 type 2 diabetes mellitus : Blood sugars is stable and controlled, on sliding scale and Lantus 10 units subcutaneous at bedtime. She may take 15 unit equivalent HS at home after discharge and follow up PCP to adjust dose.  COPD:  Stable, cont Symbicort.   Depression: on cymbalta 60 MG by mouth daily.  Hypokalemia: improved. Hypomagnesemia: Improved after treatment  Medically stable for discharge. Sign off. I discussed with Dr. Genevive Bi.   All the records are reviewed and case discussed with Care Management/Social Workerr. Management plans discussed with the patient, family and they are in agreement.  CODE STATUS: FULL CODE.  TOTAL TIME TAKING CARE OF THIS PATIENT: 35 minutes.   POSSIBLE D/C TODAY.   Demetrios Loll M.D on 09/02/2014 at 12:10 PM  Between 7am to 6pm - Pager - [phone]  After 6pm go to [email or web address] - password [email or web address] [facility]  Office  [phone]  CC: Primary care physician; Cletis Athens, MD

## 2014-09-02 NOTE — Progress Notes (Signed)
Patient ID: Marie [name], female   DOB: [birth year], 57 y.o.   MRN: [id number]  HISTORY: Not short of breath.  Off oxygen.  Pain under good control    Filed Vitals:   09/02/14 0755  BP: 110/59  Pulse: 71  Temp: 97.7 F (36.5 C)  Resp: 18   Wt Readings from Last 3 Encounters:  08/27/14 93.5 kg (206 lb 2.1 oz)  06/21/13 86.183 kg (190 lb)  05/17/13 84.369 kg (186 lb)    EXAM: Resp: Lungs are clear bilaterally.  No respiratory distress, normal effort. Heart:  Regular without murmurs Wounds are clean and dry.  Staples intact.  Leaking around chest tube.    ASSESSMENT: RLL lung cancer status post wedge resection   PLAN:   Check CXray and if OK, remove tube. Discharge today    Nestor Lewandowsky, MD

## 2014-09-02 NOTE — Progress Notes (Signed)
Pt  And O x 4. VSS. Pt voiding with no difficulties. Pt has complaints of pain with meds given to control. Complaints of nausea as well with meds given. Pt tolerating diet well. Pt had chest tube removed this afternoon. Pt had prescriptions and given discharge instructions. Pt voiced that she understood discharge instructions with no further questions. Pt went down via wheelchair with auxillary.

## 2014-09-02 NOTE — Telephone Encounter (Signed)
Call report. Read back process performed with Radiology Tech- cxr demonstrated minimal slight apical Right pneumothorax status post chest tube removal Call report made by RN to Dr. Genevive Bi. Left voice mail msg on md phone to call for report Operating room called back at 1110-read back process performed with MD via OR scrub tech.

## 2014-09-03 NOTE — Discharge Summary (Signed)
Physician Discharge Summary  Patient ID: Marie [name] MRN: [id number] DOB/AGE: 57-Jan-1957 57 y.o.  Admit date: 08/26/2014 Discharge date: 09/03/2014  Admission Diagnoses:  Discharge Diagnoses:  Active Problems:   Lung cancer   Discharged Condition: good  Hospital Course: Admitted and underwent right thoracotomy with wedge resection of RLL mass.  Negative margins and tumor was adeno.  Had an episode of hypoxia and CT angio of chest negative for PE.  Had uneventful recovery after that.  Consults: pulmonary/intensive care     Discharge Exam: Blood pressure 120/59, pulse 73, temperature 97.8 F (36.6 C), temperature source Oral, resp. rate 17, height '5\' 5"'$  (1.651 m), weight 93.5 kg (206 lb 2.1 oz), SpO2 93 %. Incision/Wound:  Clean and dry  Disposition: 01-Home or Self Care  Discharge Instructions    Diet Carb Modified    Complete by:  As directed      Increase activity slowly    Complete by:  As directed             Medication List    STOP taking these medications        diazepam 5 MG tablet  Commonly known as:  VALIUM     diphenhydrAMINE 25 MG tablet  Commonly known as:  BENADRYL     glipiZIDE 5 MG tablet  Commonly known as:  GLUCOTROL     Insulin Glargine 300 UNIT/ML Sopn  Replaced by:  insulin glargine 100 UNIT/ML injection     ondansetron 4 MG disintegrating tablet  Commonly known as:  ZOFRAN-ODT     propranolol 40 MG tablet  Commonly known as:  INDERAL     zolpidem 10 MG tablet  Commonly known as:  AMBIEN      TAKE these medications        ALPRAZolam 0.25 MG tablet  Commonly known as:  XANAX  Take 0.25 mg by mouth. 1 pill twice daily     budesonide-formoterol 160-4.5 MCG/ACT inhaler  Commonly known as:  SYMBICORT  Inhale 2 puffs into the lungs 2 (two) times daily.     buPROPion 150 MG 24 hr tablet  Commonly known as:  WELLBUTRIN XL  Take 150 mg by mouth daily.     cyclobenzaprine 10 MG tablet  Commonly known as:  FLEXERIL  Take 10 mg  by mouth 3 (three) times daily.     DULoxetine 60 MG capsule  Commonly known as:  CYMBALTA  Take 60 mg by mouth daily.     estradiol 1 MG tablet  Commonly known as:  ESTRACE  Take 1 mg by mouth daily.     furosemide 40 MG tablet  Commonly known as:  LASIX  Take 40 mg by mouth daily.     gemfibrozil 600 MG tablet  Commonly known as:  LOPID  Take 600 mg by mouth 2 (two) times daily before a meal.     HYDROcodone-acetaminophen 5-325 MG per tablet  Commonly known as:  NORCO/VICODIN  Take 1-2 tablets by mouth every 4 (four) hours as needed for moderate pain.     insulin glargine 100 UNIT/ML injection  Commonly known as:  LANTUS  Inject 0.15 mLs (15 Units total) into the skin at bedtime.     metFORMIN 500 MG tablet  Commonly known as:  GLUCOPHAGE  Take 500 mg by mouth 2 (two) times daily with a meal.     tiotropium 18 MCG inhalation capsule  Commonly known as:  SPIRIVA  Place 18 mcg into inhaler and inhale daily.  Follow-up Information    Follow up with Dr. Larey Dresser. Go on 09/11/2014.   Why:  at 11:00am but go to the [facility] at 10:30am to get your CX-ray done   Contact information:   [facility] [phone]      Signed: Nestor Lewandowsky 09/03/2014, 10:26 AM

## 2014-09-03 NOTE — Addendum Note (Signed)
Addendum  created 09/03/14 1510 by Aline Brochure, CRNA   Modules edited: Notes Section   Notes Section:  File: [id number]

## 2014-09-04 NOTE — Anesthesia Post-op Follow-up Note (Signed)
  Anesthesia Pain Follow-up Note  Patient: Marie [name]  Day #: 1  Date of Follow-up: 09/04/2014 Time: 7:55 AM  Last Vitals:  Filed Vitals:   09/02/14 1225  BP: 120/59  Pulse: 73  Temp: 36.6 C  Resp: 17    Level of Consciousness: alert  Pain: patient complains of 9/10 pain although her face and demeanor do not indicate this.   Side Effects:Pruritis  Catheter Site Exam:clean, dry  Plan: D/C Infusion  [facility]   Original rate of infusion dropped to 3 overnight due to hypotension.  Started on po pain meds and pain seems comfortable. Plan is to d/c catheter today.

## 2014-09-04 NOTE — Addendum Note (Signed)
Addendum  created 09/04/14 0802 by Allean Found, CRNA   Modules edited: Notes Section   Notes Section:  File: [id number]; File: [id number]

## 2014-09-11 ENCOUNTER — Other Ambulatory Visit: Payer: Self-pay | Admitting: *Deleted

## 2014-09-11 ENCOUNTER — Inpatient Hospital Stay: Payer: Medicare Other

## 2014-09-11 ENCOUNTER — Inpatient Hospital Stay: Payer: Medicare Other | Attending: Internal Medicine | Admitting: Cardiothoracic Surgery

## 2014-09-11 ENCOUNTER — Ambulatory Visit
Admission: RE | Admit: 2014-09-11 | Discharge: 2014-09-11 | Disposition: A | Discharge status: Home / Self Care | Payer: Medicare Other | Source: Ambulatory Visit | Attending: Cardiothoracic Surgery | Admitting: Cardiothoracic Surgery

## 2014-09-11 VITALS — BP 96/66 | HR 81 | Temp 98.9°F | Resp 18 | Ht 65.0 in | Wt 204.0 lb

## 2014-09-11 DIAGNOSIS — C3491 Malignant neoplasm of unspecified part of right bronchus or lung: Secondary | ICD-10-CM

## 2014-09-11 DIAGNOSIS — Z79899 Other long term (current) drug therapy: Secondary | ICD-10-CM | POA: Insufficient documentation

## 2014-09-11 DIAGNOSIS — I739 Peripheral vascular disease, unspecified: Secondary | ICD-10-CM | POA: Insufficient documentation

## 2014-09-11 DIAGNOSIS — R918 Other nonspecific abnormal finding of lung field: Secondary | ICD-10-CM | POA: Diagnosis not present

## 2014-09-11 DIAGNOSIS — E119 Type 2 diabetes mellitus without complications: Secondary | ICD-10-CM

## 2014-09-11 DIAGNOSIS — J449 Chronic obstructive pulmonary disease, unspecified: Secondary | ICD-10-CM | POA: Insufficient documentation

## 2014-09-11 DIAGNOSIS — Z9889 Other specified postprocedural states: Secondary | ICD-10-CM | POA: Diagnosis not present

## 2014-09-11 DIAGNOSIS — Z794 Long term (current) use of insulin: Secondary | ICD-10-CM | POA: Insufficient documentation

## 2014-09-11 DIAGNOSIS — C3431 Malignant neoplasm of lower lobe, right bronchus or lung: Secondary | ICD-10-CM | POA: Insufficient documentation

## 2014-09-11 DIAGNOSIS — I10 Essential (primary) hypertension: Secondary | ICD-10-CM | POA: Insufficient documentation

## 2014-09-11 DIAGNOSIS — Z87891 Personal history of nicotine dependence: Secondary | ICD-10-CM | POA: Insufficient documentation

## 2014-09-11 DIAGNOSIS — Z85118 Personal history of other malignant neoplasm of bronchus and lung: Secondary | ICD-10-CM | POA: Diagnosis not present

## 2014-09-11 DIAGNOSIS — Z90710 Acquired absence of both cervix and uterus: Secondary | ICD-10-CM | POA: Insufficient documentation

## 2014-09-11 DIAGNOSIS — R197 Diarrhea, unspecified: Secondary | ICD-10-CM

## 2014-09-11 DIAGNOSIS — M1990 Unspecified osteoarthritis, unspecified site: Secondary | ICD-10-CM | POA: Insufficient documentation

## 2014-09-11 MED ORDER — ONDANSETRON HCL 8 MG PO TABS: 8.0000 mg | ORAL_TABLET | Freq: Three times a day (TID) | ORAL | Status: DC | PRN

## 2014-09-11 MED ORDER — HYDROCODONE-ACETAMINOPHEN 5-325 MG PO TABS: 1.0000 | ORAL_TABLET | ORAL | Status: DC | PRN

## 2014-09-11 MED ORDER — METRONIDAZOLE 500 MG PO TABS: 500.0000 mg | ORAL_TABLET | Freq: Three times a day (TID) | ORAL | Status: DC

## 2014-09-11 NOTE — Patient Instructions (Addendum)
You were given a prescription for Flagyl, Zofran and Norco today.  Metronidazole extended-release tablets What is this medicine? METRONIDAZOLE (me troe NI da zole) is an antiinfective. It is used to treat certain kinds of bacterial and protozoal infections. It will not work for colds, flu, or other viral infections. This medicine may be used for other purposes; ask your health care provider or pharmacist if you have questions. COMMON BRAND NAME(S): Flagyl ER What should I tell my health care provider before I take this medicine? They need to know if you have any of these conditions: -anemia or other blood disorders -disease of the nervous system -fungal or yeast infection -if you drink alcohol containing drinks -liver disease -seizures -an unusual or allergic reaction to metronidazole, or other medicines, foods, dyes, or preservatives -pregnant or trying to get pregnant -breast-feeding How should I use this medicine? Take this medicine by mouth with a full glass of water. Follow the directions on the prescription label. Do not crush or chew. Take this medicine on an empty stomach 1 hour before or 2 hours after meals or food. Take your medicine at regular intervals. Do not take your medicine more often than directed. Take all of your medicine as directed even if you think you are better. Do not skip doses or stop your medicine early. Talk to your pediatrician regarding the use of this medicine in children. Special care may be needed. Overdosage: If you think you have taken too much of this medicine contact a poison control center or emergency room at once. NOTE: This medicine is only for you. Do not share this medicine with others. What if I miss a dose? If you miss a dose, take it as soon as you can. If it is almost time for your next dose, take only that dose. Do not take double or extra doses. What may interact with this medicine? Do not take this medicine with any of the following  medications: -alcohol or any product that contains alcohol -amprenavir oral solution -cisapride -disulfiram -dofetilide -dronedarone -paclitaxel injection -pimozide -ritonavir oral solution -sertraline oral solution -sulfamethoxazole-trimethoprim injection -thioridazine -ziprasidone This medicine may also interact with the following medications: -birth control pills -cimetidine -lithium -other medicines that prolong the QT interval (cause an abnormal heart rhythm) -phenobarbital -phenytoin -warfarin This list may not describe all possible interactions. Give your health care provider a list of all the medicines, herbs, non-prescription drugs, or dietary supplements you use. Also tell them if you smoke, drink alcohol, or use illegal drugs. Some items may interact with your medicine. What should I watch for while using this medicine? Tell your doctor or health care professional if your symptoms do not improve or if they get worse. You may get drowsy or dizzy. Do not drive, use machinery, or do anything that needs mental alertness until you know how this medicine affects you. Do not stand or sit up quickly, especially if you are an older patient. This reduces the risk of dizzy or fainting spells. Avoid alcoholic drinks while you are taking this medicine and for three days afterward. Alcohol may make you feel dizzy, sick, or flushed. If you are being treated for a sexually transmitted disease, avoid sexual contact until you have finished your treatment. Your sexual partner may also need treatment. What side effects may I notice from receiving this medicine? Side effects that you should report to your doctor or health care professional as soon as possible: -allergic reactions like skin rash, itching or hives, swelling of  the face, lips, or tongue -confusion, clumsiness -difficulty speaking -discolored or sore mouth -dizziness -fever, infection -numbness, tingling, pain or weakness in  the hands or feet -trouble passing urine or change in the amount of urine -redness, blistering, peeling or loosening of the skin, including inside the mouth -seizures -unusually weak or tired -vaginal irritation, dryness, or discharge Side effects that usually do not require medical attention (report to your doctor or health care professional if they continue or are bothersome): -diarrhea -headache -irritability -metallic taste -nausea -stomach pain or cramps -trouble sleeping This list may not describe all possible side effects. Call your doctor for medical advice about side effects. You may report side effects to FDA at 1-800-FDA-1088. Where should I keep my medicine? Keep out of the reach of children. Store at room temperature between 15 and 30 degrees C (59 and 86 degrees F). Protect from light and moisture. Keep container tightly closed. Throw away any unused medicine after the expiration date. NOTE: This sheet is a summary. It may not cover all possible information. If you have questions about this medicine, talk to your doctor, pharmacist, or health care provider.  2015, Elsevier/Gold Standard. (2012-11-02 14:05:10)   Acetaminophen; Hydrocodone tablets or capsules What is this medicine? ACETAMINOPHEN; HYDROCODONE (a set a MEE noe fen; hye droe KOE done) is a pain reliever. It is used to treat mild to moderate pain. This medicine may be used for other purposes; ask your health care provider or pharmacist if you have questions. COMMON BRAND NAME(S): Anexsia, Bancap HC, Ceta-Plus, Co-Gesic, Comfortpak, Dolagesic, [employer], DuoCet, Hydrocet, Hydrogesic, Bache, Lorcet HD, Lorcet Plus, Lortab, Margesic H, Maxidone, [place], Polygesic, [place], [place], [place], Vicodin, Vicodin ES, Vicodin HP, Charlane Ferretti What should I tell my health care provider before I take this medicine? They need to know if you have any of these conditions: -brain tumor -Crohn's disease, inflammatory  bowel disease, or ulcerative colitis -drug abuse or addiction -head injury -heart or circulation problems -if you often drink alcohol -kidney disease or problems going to the bathroom -liver disease -lung disease, asthma, or breathing problems -an unusual or allergic reaction to acetaminophen, hydrocodone, other opioid analgesics, other medicines, foods, dyes, or preservatives -pregnant or trying to get pregnant -breast-feeding How should I use this medicine? Take this medicine by mouth. Swallow it with a full glass of water. Follow the directions on the prescription label. If the medicine upsets your stomach, take the medicine with food or milk. Do not take more than you are told to take. Talk to your pediatrician regarding the use of this medicine in children. This medicine is not approved for use in children. Overdosage: If you think you have taken too much of this medicine contact a poison control center or emergency room at once. NOTE: This medicine is only for you. Do not share this medicine with others. What if I miss a dose? If you miss a dose, take it as soon as you can. If it is almost time for your next dose, take only that dose. Do not take double or extra doses. What may interact with this medicine? -alcohol -antihistamines -isoniazid -medicines for depression, anxiety, or psychotic disturbances -medicines for sleep -muscle relaxants -naltrexone -narcotic medicines (opiates) for pain -phenobarbital -ritonavir -tramadol This list may not describe all possible interactions. Give your health care provider a list of all the medicines, herbs, non-prescription drugs, or dietary supplements you use. Also tell them if you smoke, drink alcohol, or use illegal drugs. Some items may interact  with your medicine. What should I watch for while using this medicine? Tell your doctor or health care professional if your pain does not go away, if it gets worse, or if you have new or a  different type of pain. You may develop tolerance to the medicine. Tolerance means that you will need a higher dose of the medicine for pain relief. Tolerance is normal and is expected if you take the medicine for a long time. Do not suddenly stop taking your medicine because you may develop a severe reaction. Your body becomes used to the medicine. This does NOT mean you are addicted. Addiction is a behavior related to getting and using a drug for a non-medical reason. If you have pain, you have a medical reason to take pain medicine. Your doctor will tell you how much medicine to take. If your doctor wants you to stop the medicine, the dose will be slowly lowered over time to avoid any side effects. You may get drowsy or dizzy when you first start taking the medicine or change doses. Do not drive, use machinery, or do anything that may be dangerous until you know how the medicine affects you. Stand or sit up slowly. There are different types of narcotic medicines (opiates) for pain. If you take more than one type at the same time, you may have more side effects. Give your health care provider a list of all medicines you use. Your doctor will tell you how much medicine to take. Do not take more medicine than directed. Call emergency for help if you have problems breathing. The medicine will cause constipation. Try to have a bowel movement at least every 2 to 3 days. If you do not have a bowel movement for 3 days, call your doctor or health care professional. Too much acetaminophen can be very dangerous. Do not take Tylenol (acetaminophen) or medicines that contain acetaminophen with this medicine. Many non-prescription medicines contain acetaminophen. Always read the labels carefully. What side effects may I notice from receiving this medicine? Side effects that you should report to your doctor or health care professional as soon as possible: -allergic reactions like skin rash, itching or hives, swelling of  the face, lips, or tongue -breathing problems -confusion -feeling faint or lightheaded, falls -stomach pain -yellowing of the eyes or skin Side effects that usually do not require medical attention (report to your doctor or health care professional if they continue or are bothersome): -nausea, vomiting -stomach upset This list may not describe all possible side effects. Call your doctor for medical advice about side effects. You may report side effects to FDA at 1-800-FDA-1088. Where should I keep my medicine? Keep out of the reach of children. This medicine can be abused. Keep your medicine in a safe place to protect it from theft. Do not share this medicine with anyone. Selling or giving away this medicine is dangerous and against the law. Store at room temperature between 15 and 30 degrees C (59 and 86 degrees F). Protect from light. Keep container tightly closed. Throw away any unused medicine after the expiration date. Discard unused medicine and used packaging carefully. Pets and children can be harmed if they find used or lost packages. NOTE: This sheet is a summary. It may not cover all possible information. If you have questions about this medicine, talk to your doctor, pharmacist, or health care provider.  2015, Elsevier/Gold Standard. (2012-11-19 13:15:56)    Ondansetron tablets What is this medicine? ONDANSETRON (on DAN se  tron) is used to treat nausea and vomiting caused by chemotherapy. It is also used to prevent or treat nausea and vomiting after surgery. This medicine may be used for other purposes; ask your health care provider or pharmacist if you have questions. COMMON BRAND NAME(S): Zofran What should I tell my health care provider before I take this medicine? They need to know if you have any of these conditions: -heart disease -history of irregular heartbeat -liver disease -low levels of magnesium or potassium in the blood -an unusual or allergic reaction to  ondansetron, granisetron, other medicines, foods, dyes, or preservatives -pregnant or trying to get pregnant -breast-feeding How should I use this medicine? Take this medicine by mouth with a glass of water. Follow the directions on your prescription label. Take your doses at regular intervals. Do not take your medicine more often than directed. Talk to your pediatrician regarding the use of this medicine in children. Special care may be needed. Overdosage: If you think you have taken too much of this medicine contact a poison control center or emergency room at once. NOTE: This medicine is only for you. Do not share this medicine with others. What if I miss a dose? If you miss a dose, take it as soon as you can. If it is almost time for your next dose, take only that dose. Do not take double or extra doses. What may interact with this medicine? Do not take this medicine with any of the following medications: -apomorphine -certain medicines for fungal infections like fluconazole, itraconazole, ketoconazole, posaconazole, voriconazole -cisapride -dofetilide -dronedarone -pimozide -thioridazine -ziprasidone This medicine may also interact with the following medications: -carbamazepine -certain medicines for depression, anxiety, or psychotic disturbances -fentanyl -linezolid -MAOIs like Carbex, Eldepryl, Marplan, Nardil, and Parnate -methylene blue (injected into a vein) -other medicines that prolong the QT interval (cause an abnormal heart rhythm) -phenytoin -rifampicin -tramadol This list may not describe all possible interactions. Give your health care provider a list of all the medicines, herbs, non-prescription drugs, or dietary supplements you use. Also tell them if you smoke, drink alcohol, or use illegal drugs. Some items may interact with your medicine. What should I watch for while using this medicine? Check with your doctor or health care professional right away if you have  any sign of an allergic reaction. What side effects may I notice from receiving this medicine? Side effects that you should report to your doctor or health care professional as soon as possible: -allergic reactions like skin rash, itching or hives, swelling of the face, lips or tongue -breathing problems -confusion -dizziness -fast or irregular heartbeat -feeling faint or lightheaded, falls -fever and chills -loss of balance or coordination -seizures -sweating -swelling of the hands or feet -tightness in the chest -tremors -unusually weak or tired Side effects that usually do not require medical attention (report to your doctor or health care professional if they continue or are bothersome): -constipation or diarrhea -headache This list may not describe all possible side effects. Call your doctor for medical advice about side effects. You may report side effects to FDA at 1-800-FDA-1088. Where should I keep my medicine? Keep out of the reach of children. Store between 2 and 30 degrees C (36 and 86 degrees F). Throw away any unused medicine after the expiration date. NOTE: This sheet is a summary. It may not cover all possible information. If you have questions about this medicine, talk to your doctor, pharmacist, or health care provider.  2015, Elsevier/Gold Standard. (2013-01-02  16:27:45)  

## 2014-09-11 NOTE — Progress Notes (Signed)
Suture intact. No signs of infection. Right lateral chest tube suture removed per md order and hospital policy. steristrip applied All staples are intact. No signs of infection. Staples were removed per md order and hospital policy. steristrips applied.

## 2014-09-11 NOTE — Progress Notes (Signed)
[facility] Follow Up Visit  Name: Marie [name] Datee:09/11/2014 MRN: [id number] DOB:[birth year]  Subjective: This patient returns today in follow-up. She underwent a right thoracotomy and right lower lobe wedge resection. The final pathology revealed a stage I carcinoma the lung. The patient's main complaint is that for the last 3 days she's had increasing diarrhea associated with occasional belching. She states she's had some nausea as well and has avoided any solid foods. She is able to drink water and Pepsi products. She denied any fevers or chills. She denied any significant shortness of breath.  Objective: Overall she is a well-developed well-nourished woman in no distress. Her work on the wound is well-healed. All the staples and sutures were intact.  She did get a chest x-ray which have independently reviewed. That looks good. There is no pneumothorax or pleural effusion.  Assessment: This patient has a right lower lobe lung cancer. I believe she may also have Clostridium difficile diarrhea. She had a prolonged course of antibiotics for a diabetic foot ulcer and developed C. difficile with that. She states that this is a similar clinical scenario as to when she was diagnosed with C. difficile. We will go ahead and send stool for C. difficile.  Plan: We will send stool for C. difficile. I will also set up to see one of our oncologist for her lung cancer.

## 2014-09-12 ENCOUNTER — Other Ambulatory Visit: Payer: Self-pay

## 2014-09-12 DIAGNOSIS — Z90710 Acquired absence of both cervix and uterus: Secondary | ICD-10-CM | POA: Diagnosis not present

## 2014-09-12 DIAGNOSIS — J449 Chronic obstructive pulmonary disease, unspecified: Secondary | ICD-10-CM | POA: Diagnosis not present

## 2014-09-12 DIAGNOSIS — C3431 Malignant neoplasm of lower lobe, right bronchus or lung: Secondary | ICD-10-CM | POA: Diagnosis not present

## 2014-09-12 DIAGNOSIS — Z79899 Other long term (current) drug therapy: Secondary | ICD-10-CM | POA: Diagnosis not present

## 2014-09-12 DIAGNOSIS — Z794 Long term (current) use of insulin: Secondary | ICD-10-CM | POA: Diagnosis not present

## 2014-09-12 DIAGNOSIS — M1990 Unspecified osteoarthritis, unspecified site: Secondary | ICD-10-CM | POA: Diagnosis not present

## 2014-09-12 DIAGNOSIS — R197 Diarrhea, unspecified: Secondary | ICD-10-CM

## 2014-09-12 DIAGNOSIS — I10 Essential (primary) hypertension: Secondary | ICD-10-CM | POA: Diagnosis not present

## 2014-09-12 DIAGNOSIS — Z87891 Personal history of nicotine dependence: Secondary | ICD-10-CM | POA: Diagnosis not present

## 2014-09-12 DIAGNOSIS — E119 Type 2 diabetes mellitus without complications: Secondary | ICD-10-CM | POA: Diagnosis not present

## 2014-09-12 DIAGNOSIS — I739 Peripheral vascular disease, unspecified: Secondary | ICD-10-CM | POA: Diagnosis not present

## 2014-09-12 LAB — C DIFFICILE QUICK SCREEN W PCR REFLEX
C DIFFICILE (CDIFF) INTERP: NEGATIVE
C Diff antigen: NEGATIVE
C Diff toxin: NEGATIVE

## 2014-09-16 ENCOUNTER — Inpatient Hospital Stay (HOSPITAL_BASED_OUTPATIENT_CLINIC_OR_DEPARTMENT_OTHER): Payer: Medicare Other | Admitting: Internal Medicine

## 2014-09-16 VITALS — BP 119/75 | HR 76 | Temp 95.0°F | Ht 65.0 in | Wt 184.1 lb

## 2014-09-16 DIAGNOSIS — M1990 Unspecified osteoarthritis, unspecified site: Secondary | ICD-10-CM | POA: Diagnosis not present

## 2014-09-16 DIAGNOSIS — I739 Peripheral vascular disease, unspecified: Secondary | ICD-10-CM | POA: Diagnosis not present

## 2014-09-16 DIAGNOSIS — Z794 Long term (current) use of insulin: Secondary | ICD-10-CM | POA: Diagnosis not present

## 2014-09-16 DIAGNOSIS — I10 Essential (primary) hypertension: Secondary | ICD-10-CM

## 2014-09-16 DIAGNOSIS — Z87891 Personal history of nicotine dependence: Secondary | ICD-10-CM

## 2014-09-16 DIAGNOSIS — Z90710 Acquired absence of both cervix and uterus: Secondary | ICD-10-CM

## 2014-09-16 DIAGNOSIS — C3431 Malignant neoplasm of lower lobe, right bronchus or lung: Secondary | ICD-10-CM

## 2014-09-16 DIAGNOSIS — Z79899 Other long term (current) drug therapy: Secondary | ICD-10-CM | POA: Diagnosis not present

## 2014-09-16 DIAGNOSIS — J449 Chronic obstructive pulmonary disease, unspecified: Secondary | ICD-10-CM

## 2014-09-16 DIAGNOSIS — E119 Type 2 diabetes mellitus without complications: Secondary | ICD-10-CM | POA: Diagnosis not present

## 2014-09-25 ENCOUNTER — Encounter: Payer: Self-pay | Admitting: Radiology

## 2014-09-25 ENCOUNTER — Emergency Department: Payer: Medicare Other

## 2014-09-25 ENCOUNTER — Emergency Department
Admission: EM | Admit: 2014-09-25 | Discharge: 2014-09-25 | Disposition: A | Discharge status: Home / Self Care | Payer: Medicare Other | Attending: Emergency Medicine | Admitting: Emergency Medicine

## 2014-09-25 DIAGNOSIS — R0789 Other chest pain: Secondary | ICD-10-CM | POA: Diagnosis not present

## 2014-09-25 DIAGNOSIS — Z79899 Other long term (current) drug therapy: Secondary | ICD-10-CM | POA: Diagnosis not present

## 2014-09-25 DIAGNOSIS — E119 Type 2 diabetes mellitus without complications: Secondary | ICD-10-CM | POA: Diagnosis not present

## 2014-09-25 DIAGNOSIS — Z880 Allergy status to penicillin: Secondary | ICD-10-CM | POA: Insufficient documentation

## 2014-09-25 DIAGNOSIS — Z720 Tobacco use: Secondary | ICD-10-CM | POA: Diagnosis not present

## 2014-09-25 DIAGNOSIS — Z7951 Long term (current) use of inhaled steroids: Secondary | ICD-10-CM | POA: Insufficient documentation

## 2014-09-25 DIAGNOSIS — Z9889 Other specified postprocedural states: Secondary | ICD-10-CM | POA: Diagnosis not present

## 2014-09-25 DIAGNOSIS — R079 Chest pain, unspecified: Secondary | ICD-10-CM

## 2014-09-25 DIAGNOSIS — R0602 Shortness of breath: Secondary | ICD-10-CM | POA: Diagnosis present

## 2014-09-25 DIAGNOSIS — I10 Essential (primary) hypertension: Secondary | ICD-10-CM | POA: Diagnosis not present

## 2014-09-25 DIAGNOSIS — J441 Chronic obstructive pulmonary disease with (acute) exacerbation: Secondary | ICD-10-CM | POA: Insufficient documentation

## 2014-09-25 DIAGNOSIS — Z794 Long term (current) use of insulin: Secondary | ICD-10-CM | POA: Insufficient documentation

## 2014-09-25 DIAGNOSIS — J90 Pleural effusion, not elsewhere classified: Secondary | ICD-10-CM | POA: Diagnosis not present

## 2014-09-25 DIAGNOSIS — J439 Emphysema, unspecified: Secondary | ICD-10-CM | POA: Diagnosis not present

## 2014-09-25 DIAGNOSIS — R918 Other nonspecific abnormal finding of lung field: Secondary | ICD-10-CM | POA: Diagnosis not present

## 2014-09-25 DIAGNOSIS — C3491 Malignant neoplasm of unspecified part of right bronchus or lung: Secondary | ICD-10-CM | POA: Diagnosis not present

## 2014-09-25 HISTORY — DX: Malignant (primary) neoplasm, unspecified: C80.1

## 2014-09-25 HISTORY — DX: Heart failure, unspecified: I50.9

## 2014-09-25 LAB — CBC
HCT: 39.4 % (ref 35.0–47.0)
Hemoglobin: 12.9 g/dL (ref 12.0–16.0)
MCH: 30 pg (ref 26.0–34.0)
MCHC: 32.8 g/dL (ref 32.0–36.0)
MCV: 91.6 fL (ref 80.0–100.0)
Platelets: 281 10*3/uL (ref 150–440)
RBC: 4.3 MIL/uL (ref 3.80–5.20)
RDW: 13.4 % (ref 11.5–14.5)
WBC: 11.2 10*3/uL — ABNORMAL HIGH (ref 3.6–11.0)

## 2014-09-25 LAB — BASIC METABOLIC PANEL
Anion gap: 8 (ref 5–15)
BUN: 23 mg/dL — ABNORMAL HIGH (ref 6–20)
CALCIUM: 9.6 mg/dL (ref 8.9–10.3)
CO2: 28 mmol/L (ref 22–32)
Chloride: 100 mmol/L — ABNORMAL LOW (ref 101–111)
Creatinine, Ser: 0.86 mg/dL (ref 0.44–1.00)
GFR calc Af Amer: >60 mL/min (ref >60)
GFR calc non Af Amer: >60 mL/min (ref >60)
GLUCOSE: 226 mg/dL — AB (ref 65–99)
Potassium: 4.2 mmol/L (ref 3.5–5.1)
Sodium: 136 mmol/L (ref 135–145)

## 2014-09-25 LAB — TROPONIN I: Troponin I: <0.03 ng/mL (ref <0.031)

## 2014-09-25 MED ORDER — MORPHINE SULFATE 4 MG/ML IJ SOLN
4.0000 mg | Freq: Once | INTRAMUSCULAR | Status: AC
  Administered 2014-09-25: 4 mg via INTRAVENOUS

## 2014-09-25 MED ORDER — HYDROMORPHONE HCL 1 MG/ML IJ SOLN
0.5000 mg | Freq: Once | INTRAMUSCULAR | Status: AC
  Administered 2014-09-25: 0.5 mg via INTRAVENOUS

## 2014-09-25 MED ORDER — IOHEXOL 350 MG/ML SOLN: 100.0000 mL | Freq: Once | INTRAVENOUS | Status: DC | PRN

## 2014-09-25 MED ORDER — MORPHINE SULFATE 4 MG/ML IJ SOLN
INTRAMUSCULAR | Status: AC
  Administered 2014-09-25: 4 mg via INTRAVENOUS
  Filled 2014-09-25: qty 1

## 2014-09-25 MED ORDER — HYDROMORPHONE HCL 1 MG/ML IJ SOLN
INTRAMUSCULAR | Status: AC
  Administered 2014-09-25: 0.5 mg via INTRAVENOUS
  Filled 2014-09-25: qty 1

## 2014-09-25 MED ORDER — ONDANSETRON HCL 4 MG/2ML IJ SOLN
INTRAMUSCULAR | Status: AC
  Administered 2014-09-25: 4 mg via INTRAVENOUS
  Filled 2014-09-25: qty 2

## 2014-09-25 MED ORDER — ONDANSETRON HCL 4 MG/2ML IJ SOLN
4.0000 mg | Freq: Once | INTRAMUSCULAR | Status: AC
  Administered 2014-09-25: 4 mg via INTRAVENOUS

## 2014-09-25 MED ORDER — HYDROCODONE-ACETAMINOPHEN 5-325 MG PO TABS: 1.0000 | ORAL_TABLET | ORAL | Status: DC | PRN

## 2014-09-25 MED ORDER — ONDANSETRON HCL 4 MG PO TABS: 4.0000 mg | ORAL_TABLET | Freq: Every day | ORAL | Status: DC | PRN

## 2014-09-25 NOTE — Consult Note (Signed)
Medical Consultation  Marie [name]:[id number] DOB: 1956-04-02 DOA: 09/25/2014 PCP: Cletis Athens, MD   Requesting physician: Dr Rushie Chestnut Date of consultation: 09/25/2014  Reason for consultation: intractable right sided pain  Impression/Recommendations  1. Inducible right sided pain: Likely muscuskeletal Try pain meds like Dilaudid or Toradol Touch base with Dr Genevive Bi  2. Recent surgery s/p right lung resection  3.. Diabetes: continue outpatient medications 4. HTN: Continue outpatient medications 5. Tobacco dependence: counselled 3 minutes to stop smoking or try to cut back   At this time no acute need for hospitization. Call me please if Dr. Genevive Bi thinks she needs to be admitted. Chief Complaint: right sided pain   HPI:  This is a 59 y/o female who had a recent wedge resection of right lung for lung cancer. She presents with right sided pain at surgical site and up towards breast. Pain is 8/10 and woke her up this am. This is not chest pain. No recent trauma or lifting heavy objects. No other associated symptoms.  We are contacted due to her pain.    Review of Systems  Constitutional: Negative for fever, chills weight loss HENT: Negative for ear pain, nosebleeds, congestion, facial swelling, rhinorrhea, neck pain, neck stiffness and ear discharge.   Respiratory: Negative for cough, shortness of breath, wheezing  Cardiovascular: Negative for chest pain, palpitations and leg swelling.  Gastrointestinal: Negative for heartburn, abdominal pain, vomiting, diarrhea or consitpation Genitourinary: Negative for dysuria, urgency, frequency, hematuria Musculoskeletal: +pain right side Neurological: Negative for dizziness, seizures, syncope, focal weakness,  numbness and headaches.  Hematological: Does not bruise/bleed easily.  Psychiatric/Behavioral: Negative for hallucinations, confusion, dysphoric mood   Past Medical History  Diagnosis Date  . Arthritis   . Hypertension   . PAD  (peripheral artery disease)   . COPD (chronic obstructive pulmonary disease)   . Cancer     Lung CA  . CHF (congestive heart failure)   . Diabetes mellitus without complication     Metformin   Past Surgical History  Procedure Laterality Date  . Abdominal hysterectomy    . Cesarean section      x3  . Foot surgery Left     diabetic ulcer  . Carpal tunnel release Bilateral   . Knee arthroscopy Bilateral   . Tonsillectomy    . Back surgery  x 7  . Video assisted thoracoscopy (vats)/thorocotomy Right 08/26/2014    Procedure: VIDEO ASSISTED THORACOSCOPY (VATS)/THOROCOTOMY;  Surgeon: Nestor Lewandowsky, MD;  Location: [facility] ORS;  Service: General;  Laterality: Right;   Social History:  reports that she has been smoking Cigarettes.  She has been smoking about 0.50 packs per day. She has never used smokeless tobacco. She reports that she does not drink alcohol or use illicit drugs.  Allergies  Allergen Reactions  . Penicillins Anaphylaxis and Shortness Of Breath  . Ampicillin     Other reaction(s): Unknown  . Avelox [Moxifloxacin Hcl In Nacl] Other (See Comments)    MUSCLE CRAMPS  . Vicodin [Hydrocodone-Acetaminophen] Nausea Only  . Etodolac Rash  . Neurontin [Gabapentin] Anxiety   No family history on file.  Prior to Admission medications   Medication Sig Start Date End Date Taking? Authorizing Provider  ALPRAZolam (XANAX) 0.25 MG tablet Take 0.25 mg by mouth. 1 pill twice daily    Historical Provider, MD  budesonide-formoterol (SYMBICORT) 160-4.5 MCG/ACT inhaler Inhale 2 puffs into the lungs 2 (two) times daily.    Historical Provider, MD  buPROPion (WELLBUTRIN XL) 150 MG 24 hr tablet  Take 150 mg by mouth daily.    Historical Provider, MD  cyclobenzaprine (FLEXERIL) 10 MG tablet Take 10 mg by mouth 3 (three) times daily.     Historical Provider, MD  diazepam (VALIUM) 5 MG tablet Take 5 mg by mouth daily. 08/15/14   Historical Provider, MD  DULoxetine (CYMBALTA) 60 MG capsule Take 60 mg by  mouth daily.    Historical Provider, MD  furosemide (LASIX) 40 MG tablet Take 40 mg by mouth daily.    Historical Provider, MD  gemfibrozil (LOPID) 600 MG tablet Take 600 mg by mouth 2 (two) times daily before a meal.    Historical Provider, MD  glipiZIDE (GLUCOTROL) 5 MG tablet Take 5 mg by mouth daily. 06/26/14   Historical Provider, MD  HYDROcodone-acetaminophen (NORCO/VICODIN) 5-325 MG per tablet Take 1-2 tablets by mouth every 4 (four) hours as needed for moderate pain. 09/11/14   Nestor Lewandowsky, MD  insulin glargine (LANTUS) 100 UNIT/ML injection Inject 0.15 mLs (15 Units total) into the skin at bedtime. 09/02/14   Nestor Lewandowsky, MD  metFORMIN (GLUCOPHAGE) 500 MG tablet Take 500 mg by mouth 2 (two) times daily with a meal.    Historical Provider, MD  metroNIDAZOLE (FLAGYL) 500 MG tablet Take 1 tablet (500 mg total) by mouth 3 (three) times daily. 09/11/14   Nestor Lewandowsky, MD  ondansetron (ZOFRAN) 8 MG tablet Take 1 tablet (8 mg total) by mouth every 8 (eight) hours as needed for nausea or vomiting. 09/11/14   Nestor Lewandowsky, MD  propranolol (INDERAL) 40 MG tablet Take 40 mg by mouth 2 (two) times daily. 08/19/14   Historical Provider, MD  SYMBICORT 80-4.5 MCG/ACT inhaler Inhale 1 puff into the lungs 2 (two) times daily. 08/04/14   Historical Provider, MD  tiotropium (SPIRIVA) 18 MCG inhalation capsule Place 18 mcg into inhaler and inhale daily.    Historical Provider, MD    Physical Exam: Blood pressure 100/67, pulse 69, temperature 97.9 F (36.6 C), temperature source Oral, resp. rate 12, height '5\' 5"'$  (1.651 m), weight 83.462 kg (184 lb), SpO2 98 %. '@VITALS2'$ @ Filed Weights   09/25/14 0606  Weight: 83.462 kg (184 lb)   No intake or output data in the 24 hours ending 09/25/14 0911   Constitutional: Appears well-developed and well-nourished. No distress. HENT: Normocephalic. External right and left ear normal. Oropharynx is clear and moist.  Eyes: Conjunctivae and EOM are normal. PERRLA, no scleral  icterus.  Neck: Normal ROM. Neck supple. No JVD. No tracheal deviation. No thyromegaly.  CVS: RRR, S1/S2 +, no murmurs, no gallops, no carotid bruit.  Pulmonary: Effort and breath sounds normal, no stridor, rhonchi, wheezes, rales.  Abdominal: Soft. BS +,  no distension, tenderness, rebound or guarding.  Musculoskeletal: pain on palpation right surgical site. Neuro: Alert. Normal reflexes, muscle tone coordination. No cranial nerve deficit. Skin: Skin is warm and dry. Surgical site in clean and intact Psychiatric: Normal mood and affect. Behavior, judgment, thought content normal.    Labs  Basic Metabolic Panel:  Recent Labs Lab 09/25/14 0639  NA 136  K 4.2  CL 100*  CO2 28  GLUCOSE 226*  BUN 23*  CREATININE 0.86  CALCIUM 9.6   Liver Function Tests: No results for input(s): AST, ALT, ALKPHOS, BILITOT, PROT, ALBUMIN in the last 168 hours. No results for input(s): LIPASE, AMYLASE in the last 168 hours.  CBC:  Recent Labs Lab 09/25/14 0639  WBC 11.2*  HGB 12.9  HCT 39.4  MCV 91.6  PLT 281  Cardiac Enzymes:  Recent Labs Lab 09/25/14 0639  TROPONINI <0.03   BNP: Invalid input(s): POCBNP CBG: No results for input(s): GLUCAP in the last 168 hours.  Radiological Exams: Dg Chest 2 View  09/25/2014   .  IMPRESSION: Linear infiltration in the right lung base is probably postoperative. No pneumothorax. Left lung is clear.   Electronically Signed   By: Lucienne Capers M.D.   On: 09/25/2014 06:45   Ct Angio Chest Pe W/cm &/or Wo Cm  09/25/2014   .  IMPRESSION: Negative for pulmonary embolus or acute disease.  Improved appearance of postoperative change in the right lower lobe where there is a small right pleural effusion.  Small right hilar and infrahilar lymph nodes are likely reactive  Emphysema.  Fatty infiltration of the liver.   Electronically Signed   By: Inge Rise M.D.   On: 09/25/2014 07:54    EKG: NSR no ST elevation  Thank you for allowing me to  participate in the care of your patient.  Case d/w Dr. Corky Downs Time spent: 55 >50% time spent coordinating care  Ura Hausen, Ulice Bold, MD

## 2014-09-25 NOTE — ED Provider Notes (Signed)
[facility] Emergency Department Provider Note  ____________________________________________  Time seen: 6:45 AM  I have reviewed the triage vital signs and the nursing notes.   HISTORY  Chief Complaint Shortness of Breath      HPI Marie [name] is a 59 y.o. female presents with acute onset at 1 AM of 9 out of 10 sharp central chest pain radiating to the right chest area. Patient admits to dyspnea as well. Of note patient had a right lung wedge resection performed on 08/26/2014 by Dr. Faith Rogue secondary to lung cancer. Patient states the pain is aggravated with movement and deep respiration. Patient denies any alleviating factors    Past Medical History  Diagnosis Date  . Diabetes mellitus without complication   . Arthritis   . Hypertension   . PAD (peripheral artery disease)   . COPD (chronic obstructive pulmonary disease)     Patient Active Problem List   Diagnosis Date Noted  . Lung cancer 08/26/2014    Past Surgical History  Procedure Laterality Date  . Abdominal hysterectomy    . Cesarean section      x3  . Foot surgery Left     diabetic ulcer  . Carpal tunnel release Bilateral   . Knee arthroscopy Bilateral   . Tonsillectomy    . Back surgery  x 7  . Video assisted thoracoscopy (vats)/thorocotomy Right 08/26/2014    Procedure: VIDEO ASSISTED THORACOSCOPY (VATS)/THOROCOTOMY;  Surgeon: Nestor Lewandowsky, MD;  Location: [facility] ORS;  Service: General;  Laterality: Right;    Current Outpatient Rx  Name  Route  Sig  Dispense  Refill  . ALPRAZolam (XANAX) 0.25 MG tablet   Oral   Take 0.25 mg by mouth. 1 pill twice daily         . budesonide-formoterol (SYMBICORT) 160-4.5 MCG/ACT inhaler   Inhalation   Inhale 2 puffs into the lungs 2 (two) times daily.         Marland Kitchen buPROPion (WELLBUTRIN XL) 150 MG 24 hr tablet   Oral   Take 150 mg by mouth daily.         . cyclobenzaprine (FLEXERIL) 10 MG tablet   Oral   Take 10 mg by mouth 3 (three)  times daily.          . diazepam (VALIUM) 5 MG tablet   Oral   Take 5 mg by mouth daily.         . DULoxetine (CYMBALTA) 60 MG capsule   Oral   Take 60 mg by mouth daily.         . furosemide (LASIX) 40 MG tablet   Oral   Take 40 mg by mouth daily.         Marland Kitchen gemfibrozil (LOPID) 600 MG tablet   Oral   Take 600 mg by mouth 2 (two) times daily before a meal.         . glipiZIDE (GLUCOTROL) 5 MG tablet   Oral   Take 5 mg by mouth daily.         Marland Kitchen HYDROcodone-acetaminophen (NORCO/VICODIN) 5-325 MG per tablet   Oral   Take 1-2 tablets by mouth every 4 (four) hours as needed for moderate pain.   40 tablet   0   . insulin glargine (LANTUS) 100 UNIT/ML injection   Subcutaneous   Inject 0.15 mLs (15 Units total) into the skin at bedtime.   10 mL   11   . metFORMIN (GLUCOPHAGE) 500 MG tablet  Oral   Take 500 mg by mouth 2 (two) times daily with a meal.         . metroNIDAZOLE (FLAGYL) 500 MG tablet   Oral   Take 1 tablet (500 mg total) by mouth 3 (three) times daily.   42 tablet   0   . ondansetron (ZOFRAN) 8 MG tablet   Oral   Take 1 tablet (8 mg total) by mouth every 8 (eight) hours as needed for nausea or vomiting.   20 tablet   0   . propranolol (INDERAL) 40 MG tablet   Oral   Take 40 mg by mouth 2 (two) times daily.         . SYMBICORT 80-4.5 MCG/ACT inhaler   Inhalation   Inhale 1 puff into the lungs 2 (two) times daily.           Dispense as written.   . tiotropium (SPIRIVA) 18 MCG inhalation capsule   Inhalation   Place 18 mcg into inhaler and inhale daily.           Allergies Penicillins; Ampicillin; Avelox; Vicodin; Etodolac; and Neurontin  No family history on file.  Social History History  Substance Use Topics  . Smoking status: Current Every Day Smoker -- 0.50 packs/day    Types: Cigarettes  . Smokeless tobacco: Never Used  . Alcohol Use: No    Review of Systems  Constitutional: Negative for fever. Eyes:  Negative for visual changes. ENT: Negative for sore throat. Cardiovascular: Positive for chest pain. Respiratory: Positive for shortness of breath. Gastrointestinal: Negative for abdominal pain, vomiting and diarrhea. Genitourinary: Negative for dysuria. Musculoskeletal: Negative for back pain. Skin: Negative for rash. Neurological: Negative for headaches, focal weakness or numbness.   10-point ROS otherwise negative.  ____________________________________________   PHYSICAL EXAM:  VITAL SIGNS: ED Triage Vitals  Enc Vitals Group     BP 09/25/14 0606 129/87 mmHg     Pulse Rate 09/25/14 0606 87     Resp 09/25/14 0606 28     Temp 09/25/14 0606 97.9 F (36.6 C)     Temp Source 09/25/14 0606 Oral     SpO2 09/25/14 0606 98 %     Weight 09/25/14 0606 184 lb (83.462 kg)     Height 09/25/14 0606 '5\' 5"'$  (1.651 m)     Head Cir --      Peak Flow --      Pain Score 09/25/14 0603 10     Pain Loc --      Pain Edu? --      Excl. in [place]? --      Constitutional: Alert and oriented. Apparent distress Eyes: Conjunctivae are normal. PERRL. Normal extraocular movements. ENT   Head: Normocephalic and atraumatic.   Nose: No congestion/rhinnorhea.   Mouth/Throat: Mucous membranes are moist.   Neck: No stridor. Hematological/Lymphatic/Immunilogical: No cervical lymphadenopathy. Cardiovascular: Normal rate, regular rhythm. Normal and symmetric distal pulses are present in all extremities. No murmurs, rubs, or gallops. Respiratory: Normal respiratory effort without tachypnea nor retractions. Breath sounds are clear and equal bilaterally. No wheezes/rales/rhonchi. Gastrointestinal: Soft and nontender. No distention. There is no CVA tenderness. Genitourinary: deferred Musculoskeletal: Nontender with normal range of motion in all extremities. No joint effusions.  No lower extremity tenderness nor edema. Neurologic:  Normal speech and language. No gross focal neurologic deficits are  appreciated. Speech is normal.  Skin:  Skin is warm, dry and intact. No rash noted. Psychiatric: Mood and affect are normal. Speech and behavior  are normal. Patient exhibits appropriate insight and judgment.  ____________________________________________    LABS (pertinent positives/negatives)    ____________________________________________   EKG interpreted by me Dr. Marjean Donna   Date: 09/25/2014  Rate: 80  Rhythm: normal sinus rhythm  QRS Axis: normal  Intervals: normal  ST/T Wave abnormalities: normal  Conduction Disutrbances: none  Narrative Interpretation: unremarkable      ____________________________________________    RADIOLOGY    ____________________________________________   PROCEDURES  Procedure(s) performed: None  Critical Care performed: None  ____________________________________________   INITIAL IMPRESSION / ASSESSMENT AND PLAN / ED COURSE  Pertinent labs & imaging results that were available during my care of the patient were reviewed by me and considered in my medical decision making (see chart for details).    ____________________________________________   FINAL CLINICAL IMPRESSION(S) / ED DIAGNOSES  Final diagnoses:  Chest pain, unspecified chest pain type      Gregor Hams, MD 09/26/14 [phone]

## 2014-09-25 NOTE — ED Provider Notes (Signed)
Patient is required 2 doses of IV narcotics and I asked the hospitalist to consider the patient for admission. Patient seen by Dr. Genia Harold who feels that this is more chest wall pain and recommends discharge.  Unable to reach Dr. Faith Rogue who is apparently on vacation, with no other CT surgeon covering. Will discharge with analgesic's and close follow-up  Lavonia Drafts, MD 09/25/14 [id number]

## 2014-09-25 NOTE — ED Notes (Signed)
Patient transported to CT for angio study per MD order.

## 2014-09-25 NOTE — Discharge Instructions (Signed)

## 2014-09-25 NOTE — ED Notes (Signed)
Pt to room 8 via w/c; reports wedge removed from right lung 5/17 by Dr Faith Rogue for lung CA; awoke 1am with diff breathing and pain under right breast radiating into back and mid chest; denies hx of same

## 2014-09-25 NOTE — ED Notes (Signed)
MD at bedside.  Dr. Benjie Karvonen in room to assess patient for possible admission.  Patient's son is also at bedside.  Will continue to monitor.

## 2014-09-25 NOTE — ED Notes (Signed)
Patient returned to ED 8 from CT.

## 2014-09-29 ENCOUNTER — Telehealth: Payer: Self-pay | Admitting: *Deleted

## 2014-09-29 MED ORDER — HYDROCODONE-ACETAMINOPHEN 5-325 MG PO TABS: 1.0000 | ORAL_TABLET | ORAL | Status: DC | PRN

## 2014-09-29 NOTE — Telephone Encounter (Signed)
Med written by Dr Genevive Bi and pt notified and transferred to scheduling for appt on Thursday to see Dr Genevive Bi

## 2014-10-02 ENCOUNTER — Inpatient Hospital Stay: Payer: Medicare Other | Admitting: Cardiothoracic Surgery

## 2014-10-02 ENCOUNTER — Encounter: Payer: Self-pay | Admitting: Cardiothoracic Surgery

## 2014-10-02 VITALS — BP 123/78 | HR 72 | Temp 98.8°F | Ht 65.0 in | Wt 183.0 lb

## 2014-10-02 DIAGNOSIS — C3431 Malignant neoplasm of lower lobe, right bronchus or lung: Secondary | ICD-10-CM

## 2014-10-02 MED ORDER — FLUCONAZOLE 100 MG PO TABS: 100.0000 mg | ORAL_TABLET | Freq: Once | ORAL | Status: AC

## 2014-10-02 NOTE — Progress Notes (Signed)
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  (08/26/14 - RLL lung wedge resection, Surgical Pathology Report. DIAGNOSIS:  A. LUNG MASS, RIGHT LOWER LOBE; VATS WITH WEDGE RESECTION:  ADENOCARCINOMA OF THE LUNG. SEE CANCER STAGING SUMMARY BELOW.  Comment: Elastin stain highlights focal visceral pleural invasion. The adenocarcinoma has the following proportions of morphologies: 50% solid, 40% acinar, 10% lepidic. Specimen Laterality:Right  Tumor Site: Lower lobe  Tumor Focality: Unifocal   Histologic Type:  Adenocarcinoma, solid predominant  Tumor Size: 1.6 cm  Visceral Pleura Invasion:  Present  Tumor Extension: Not applicable.  MARGINS  Bronchial Margin:  Not applicable Vascular Margin:Not applicable  Parenchymal Margin: Uninvolved by invasive carcinoma  ACCESSORY FINDINGS: Lymph-Vascular Invasion: Not identified  STAGE (pTNM) Primary Tumor (pT): pT2a: Tumor greater than 3 cm, but 5 cm or less in greatest dimension surrounded by lung or visceral pleura without bronchoscopic evidence of invasion more proximal than the lobar bronchus (i.e., not in the main bronchus); or Tumor 5 cm or less in greatest dimension with any of the following features of extent: involves main bronchus, 2 cm or more distal to the carina; invades the visceral pleura; associated with atelectasis or obstructive pneumonitis that extends to the hilar region but does not involve the entire lung  Regional Lymph Nodes (pN) pNX: Cannot be assessed. No nodes submitted or found  Distant Metastases (pM): Not applicable).   -  Patient is referred here for oncology evaluation regarding adjuvant  treatment.  HISTORY OF PRESENT ILLNESS:  Marie [name] is a 59 year old female with past medical history significant for hypertension, arthritis, peripheral artery disease, chronic obstructive pulmonary disease, congestive heart failure, diabetes mellitus, abdominal hysterectomy, C-section, foot surgery, knee surgery, tonsillectomy was found to have enlarging right lower lobe lung mass and was evaluated by thoracic surgeon Dr. Faith Rogue. Patient has undergone right lower lobe lung wedge resection on May 17 with pathology reporting as above. Clinically no metastatic disease was reported and stage is Ib. She has been referred here for oncology evaluation to see if she needs adjuvant chemotherapy. Patient clinically states that she is doing fairly steady, she has chronic mild cough and dyspnea on exertion which is unchanged. Denies any major issues. Remains physically active. Appetite is good, denies unintentional weight loss. No new bone pains. No new headaches, imbalance or falls but does have symptoms of peripheral neuropathy which she attributes to diabetes.  REVIEW OF SYSTEMS:   ROS CONSTITUTIONAL: As in HPI above. No chills, fever or sweats.    ENT:  No headache, dizziness or epistaxis. No ear or jaw pain. No sinus symptoms. RESPIRATORY:  Mild cough and dyspnea on exertion, status post recent surgery for lung cancer. No hemoptysis. CARDIAC:  No palpitations.  No retrosternal chest pain. No orthopnea, PND. GI:  No abdominal pain, nausea or vomiting. No diarrhea.   GU:  No dysuria or hematuria.  SKIN: No rashes or pruritus. HEMATOLOGIC: denies bleeding symptoms MUSCULOSKELETAL:  No new bone pains.  EXTREMITY:  No new swelling or pain.  NEURO:  No focal weakness. Has constant numbness and tingling in the hands, lower legs and feet which she attributes to her diabetic neuropathy.  No seizures.   ENDOCRINE:  No polyuria  or polydipsia.   PAST MEDICAL HISTORY: Reviewed. Past Medical History  Diagnosis  Date  . Arthritis   . Hypertension   . PAD (peripheral artery disease)   . COPD (chronic obstructive pulmonary disease)   . Cancer     Lung CA  . CHF (congestive heart failure)   . Diabetes mellitus without complication     Metformin    PAST SURGICAL HISTORY: Reviewed. Past Surgical History  Procedure Laterality Date  . Abdominal hysterectomy    . Cesarean section      x3  . Foot surgery Left     diabetic ulcer  . Carpal tunnel release Bilateral   . Knee arthroscopy Bilateral   . Tonsillectomy    . Back surgery  x 7  . Video assisted thoracoscopy (vats)/thorocotomy Right 08/26/2014    Procedure: VIDEO ASSISTED THORACOSCOPY (VATS)/THOROCOTOMY;  Surgeon: Nestor Lewandowsky, MD;  Location: [facility] ORS;  Service: General;  Laterality: Right;    FAMILY HISTORY: Reviewed. Noncontributory  ADVANCED DIRECTIVES:  No - patient declined information  SOCIAL HISTORY: Reviewed. History  Substance Use Topics  . Smoking status: Former Smoker -- 0.50 packs/day    Types: Cigarettes  . Smokeless tobacco: Never Used     Comment: stopped smoking 08/25/14  . Alcohol Use: No    Allergies  Allergen Reactions  . Penicillins Anaphylaxis and Shortness Of Breath  . Ampicillin     Other reaction(s): Unknown  . Avelox [Moxifloxacin Hcl In Nacl] Other (See Comments)    MUSCLE CRAMPS  . Vicodin [Hydrocodone-Acetaminophen] Nausea Only  . Etodolac Rash  . Neurontin [Gabapentin] Anxiety    Current Outpatient Prescriptions  Medication Sig Dispense Refill  . ALPRAZolam (XANAX) 0.25 MG tablet Take 0.25 mg by mouth. 1 pill twice daily    . budesonide-formoterol (SYMBICORT) 160-4.5 MCG/ACT inhaler Inhale 2 puffs into the lungs 2 (two) times daily.    Marland Kitchen buPROPion (WELLBUTRIN XL) 150 MG 24 hr tablet Take 150 mg by mouth daily.    . cyclobenzaprine (FLEXERIL) 10 MG tablet Take 10 mg by mouth 3 (three) times daily.     . diazepam (VALIUM) 5 MG tablet Take 5 mg by mouth daily.    . DULoxetine (CYMBALTA) 60 MG  capsule Take 60 mg by mouth daily.    . furosemide (LASIX) 40 MG tablet Take 40 mg by mouth daily.    Marland Kitchen gemfibrozil (LOPID) 600 MG tablet Take 600 mg by mouth 2 (two) times daily before a meal.    . glipiZIDE (GLUCOTROL) 5 MG tablet Take 5 mg by mouth daily.    . insulin glargine (LANTUS) 100 UNIT/ML injection Inject 0.15 mLs (15 Units total) into the skin at bedtime. 10 mL 11  . propranolol (INDERAL) 40 MG tablet Take 40 mg by mouth 2 (two) times daily.    . SYMBICORT 80-4.5 MCG/ACT inhaler Inhale 1 puff into the lungs 2 (two) times daily.    . fluconazole (DIFLUCAN) 100 MG tablet Take 1 tablet (100 mg total) by mouth once. 1 tablet 0  . HYDROcodone-acetaminophen (NORCO/VICODIN) 5-325 MG per tablet Take 1-2 tablets by mouth every 4 (four) hours as needed for moderate pain. 40 tablet 0  . lidocaine (XYLOCAINE) 5 % ointment Apply 1 application topically as needed. Apply to back prn back pain    . ondansetron (ZOFRAN) 4 MG tablet Take 1 tablet (4 mg total) by mouth daily as needed for nausea or vomiting. 20 tablet 1  . Sennosides-Docusate Sodium (STOOL SOFTENER LAXATIVE PO) Take 1  capsule by mouth daily.     No current facility-administered medications for this visit.    PHYSICAL EXAM: Filed Vitals:   09/16/14 1522  BP: 119/75  Pulse: 76  Temp: 95 F (35 C)     Body mass index is 30.63 kg/(m^2).    ECOG FS:0 - Asymptomatic  CONSTITUTIONAL:   Patient is moderately built and nourished individual, alert and oriented, in no acute distress.  No icterus or pallor. HEENT:   Normocephalic, atraumatic.  Sclera and conjunctivae clear.  No oral thrush.    NECK:   Neck is supple.  No cervical adenopathy.  RESPIRATORY:  Decreased breath sounds in the right lower lobe area, otherwise good air entry. No crepitations or rhonchi. CARDIAC: S1S2, regular rate and rhythm.   GI:   Abdomen is soft, nontender.  No hepatomegaly clinically.   EXTREMITIES: No pedal edema or cyanosis.   SKIN:   No major bruising.   No rash.   NEUROLOGICAL:   Alert and oriented x 3. Grossly nonfocal. Cranial nerves are intact.  SKELETAL: No obvious swelling or redness in joints.   LAB RESULTS: 08/31/14 - creatinine 0.86, calcium 8.7, hemoglobin 11.8, WBC 7200, platelets 285.  08/26/14 - RLL lung wedge resection, Surgical Pathology Report. DIAGNOSIS:  A. LUNG MASS, RIGHT LOWER LOBE; VATS WITH WEDGE RESECTION:  ADENOCARCINOMA OF THE LUNG. SEE CANCER STAGING SUMMARY BELOW.  Comment: Elastin stain highlights focal visceral pleural invasion. The adenocarcinoma has the following proportions of morphologies: 50% solid, 40% acinar, 10% lepidic. Specimen Laterality:Right  Tumor Site: Lower lobe  Tumor Focality: Unifocal   Histologic Type:  Adenocarcinoma, solid predominant  Tumor Size: 1.6 cm  Visceral Pleura Invasion:  Present  Tumor Extension: Not applicable.  MARGINS  Bronchial Margin:  Not applicable Vascular Margin:Not applicable  Parenchymal Margin: Uninvolved by invasive carcinoma  ACCESSORY FINDINGS: Lymph-Vascular Invasion: Not identified  STAGE (pTNM) Primary Tumor (pT): pT2a: Tumor greater than 3 cm, but 5 cm or less in greatest dimension surrounded by lung or visceral pleura without bronchoscopic evidence of invasion more proximal than the lobar bronchus (i.e., not in the main bronchus); or Tumor 5 cm or less in greatest dimension with any of the following features of extent: involves main bronchus, 2 cm or more distal to the carina; invades the visceral pleura; associated with atelectasis or obstructive pneumonitis that extends to the hilar region but does not involve the entire lung  Regional Lymph Nodes (pN) pNX: Cannot be assessed. No nodes submitted or found  Distant Metastases (pM): Not applicable   STUDIES: 5/45/62 - CT Chest. IMPRESSION: Negative for pulmonary embolism. Postsurgical changes compatible with resection of the right lower lobe lesion. There is a 5 mm nodular density in the right lower lobe and  recommend attention to this area on follow-up CT imaging. Volume loss in the lower lobes bilaterally. Right chest tube with a trace amount of right pleural air. Emphysematous changes.   STAGING: Malignant neoplasm of lower lobe of right lung   Staging form: Lung, AJCC 7th Edition     Clinical stage from 09/16/2014: Stage IB (T2a, N0, M0) - Signed by Leia Alf, MD on 10/02/2014   ASSESSMENT / PLAN:   pT2a cN0 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 status post wedge resection on 08/26/14. (08/26/14 - RLL lung wedge resection, Surgical Pathology Report. DIAGNOSIS:  A. LUNG MASS, RIGHT LOWER LOBE; VATS WITH WEDGE RESECTION:  ADENOCARCINOMA OF THE LUNG. Tumor Size: 1.6 cm  Visceral Pleura Invasion:  Present  Tumor Extension: Not applicable.  MARGINS  Bronchial Margin:  Not applicable Vascular Margin:Not applicable  Parenchymal Margin: Uninvolved by invasive carcinoma. Lymph-Vascular Invasion: Not identified. STAGE (pTNM) Primary Tumor (pT): pT2a. Regional Lymph Nodes (pN) pNX: Cannot be assessed. No nodes submitted or found.  -  Reviewed records from referring physician including recent surgical pathology report and discussed with patient. Have explained that there is option of pursuing adjuvant chemotherapy with cisplatin-based regimen 4 cycles and discuss possible side effect profile and benefits of this treatment. Patient states that she has constant symptoms of peripheral neuropathy in both hands, along with both feet and legs which precludes use of cisplatin. Also have explained that for stage IB disease overall absolute benefit from adjuvant chemotherapy might be small, and that we could try carboplatin based regimen. After detailed discussion, patient also states that she does not want to pursue adjuvant chemotherapy. Have discussed case with the radiation oncologist Dr.Chrystal who does not see any indication to consider adjuvant radiation and recommended monitoring only.  Have discussed these recommendations with surgeon Dr.Oaks who plans to continue following patient for surveillance for recurrent lung cancer. Patient therefore has not been given any scheduled follow-up appointment and is being discharged from my clinic, she was advised to keep appointment with Dr.Oaks as already scheduled. I will be happy to see her back in the future if any oncology issues should recur. Patient is agreeable to this plan.   Leia Alf, MD   10/02/2014 6:15 PM

## 2014-10-02 NOTE — Progress Notes (Signed)
Patient ID: Marie [name], female   DOB: 1956-03-08, 59 y.o.   MRN: [id number]  HISTORY: She returns today in follow-up. She is now 6 weeks out from her right thoracotomy and right lower lobe wedge resection. She was seen by Dr. Loni Muse it and discussed with Dr. Donella Stade. He did not recommend any additional therapy. Her only complaints today are a rash on the palms of her hand and wrist and ankle. In addition she complains of some constipation. She states that her breathing is okay as long as she does not exert herself or lie down. She states that her foot is about the same. She was not given any follow-up at the wound care center. She has not seen her primary care physician. She does not have a phone to make that call but I encouraged her to follow-up with her primary care physician soon as possible.   PERTINENT REVIEW OF SYSTEMS: No fevers. Some constipation.  Filed Vitals:   10/02/14 0838  BP: 123/78  Pulse: 72  Temp: 98.8 F (37.1 C)   Wt Readings from Last 3 Encounters:  10/02/14 83.008 kg (183 lb)  09/25/14 83.462 kg (184 lb)  09/16/14 83.5 kg (184 lb 1.4 oz)    EXAM:  Neck:  Normal range of motion. Neck supple. No tracheal deviation present. No thyromegaly present.  Resp: Lungs are clear bilaterally.  No respiratory distress, normal effort. Heart:  Regular without murmurs There is a rash present which appears somewhat punctate and almost petechial in nature. This is on the wrist and proximal hand.  Neurological: Alert and oriente  Psychiatric: Normal mood and affect. Normal behavior. Judgment and thought content normal.      ASSESSMENT: 6 weeks out from her right thoracotomy. Her wounds are well-healed.   PLAN:   She requested a prescription for Diflucan and I'll provide that to her. I will also see her back again in 6 weeks with a chest x-ray.    Nestor Lewandowsky, MD

## 2014-10-21 ENCOUNTER — Telehealth: Payer: Self-pay | Admitting: *Deleted

## 2014-10-21 ENCOUNTER — Other Ambulatory Visit: Payer: Self-pay | Admitting: Cardiothoracic Surgery

## 2014-10-21 DIAGNOSIS — C3431 Malignant neoplasm of lower lobe, right bronchus or lung: Secondary | ICD-10-CM

## 2014-10-21 MED ORDER — HYDROCODONE-ACETAMINOPHEN 5-325 MG PO TABS: 1.0000 | ORAL_TABLET | ORAL | Status: DC | PRN

## 2014-10-21 NOTE — Telephone Encounter (Signed)
Spoke with patient.  MD filled RX.  She will send her daughter to pick up the RX. Explained to pt that daughter must bring photo id to pick up rx. Pt also states that she is out of her lantus. I asked the patient to contact her primary care provider regarding the Lantus as Dr. Lavera Guise is managing the pt's diabetes.

## 2014-10-21 NOTE — Telephone Encounter (Signed)
Per Judeen Hammans, Therapist, sports. Patient came to clinic today with her boyfriend, who is also a patient. She has requested that Dr. Ma Hillock prescribe norco for pain. She still reports post surgery pain. Per Dr. Ma Hillock, md will not fill this RX as the patient has only seen Dr. Ma Hillock once. He would rather Dr. Genevive Bi determine if another RX for Norco is warranted. The patient last received a norco rx on 6/20 by Dr. Genevive Bi. Nira Conn, RN will speak to Dr. Genevive Bi to determine if MD will RF RX at this time.

## 2014-10-31 ENCOUNTER — Encounter: Payer: Self-pay | Admitting: Emergency Medicine

## 2014-10-31 ENCOUNTER — Inpatient Hospital Stay
Admission: EM | Admit: 2014-10-31 | Discharge: 2014-11-02 | DRG: 623 | Disposition: A | Discharge status: Home Health | Payer: Medicare Other | Attending: Internal Medicine | Admitting: Internal Medicine

## 2014-10-31 ENCOUNTER — Emergency Department: Payer: Medicare Other

## 2014-10-31 DIAGNOSIS — F418 Other specified anxiety disorders: Secondary | ICD-10-CM | POA: Diagnosis present

## 2014-10-31 DIAGNOSIS — M869 Osteomyelitis, unspecified: Secondary | ICD-10-CM | POA: Diagnosis present

## 2014-10-31 DIAGNOSIS — Z79891 Long term (current) use of opiate analgesic: Secondary | ICD-10-CM

## 2014-10-31 DIAGNOSIS — Z888 Allergy status to other drugs, medicaments and biological substances status: Secondary | ICD-10-CM

## 2014-10-31 DIAGNOSIS — E1140 Type 2 diabetes mellitus with diabetic neuropathy, unspecified: Secondary | ICD-10-CM | POA: Diagnosis not present

## 2014-10-31 DIAGNOSIS — I509 Heart failure, unspecified: Secondary | ICD-10-CM | POA: Diagnosis not present

## 2014-10-31 DIAGNOSIS — E876 Hypokalemia: Secondary | ICD-10-CM | POA: Diagnosis not present

## 2014-10-31 DIAGNOSIS — Z885 Allergy status to narcotic agent status: Secondary | ICD-10-CM

## 2014-10-31 DIAGNOSIS — Z880 Allergy status to penicillin: Secondary | ICD-10-CM

## 2014-10-31 DIAGNOSIS — Z7951 Long term (current) use of inhaled steroids: Secondary | ICD-10-CM

## 2014-10-31 DIAGNOSIS — L97521 Non-pressure chronic ulcer of other part of left foot limited to breakdown of skin: Secondary | ICD-10-CM | POA: Diagnosis not present

## 2014-10-31 DIAGNOSIS — M86172 Other acute osteomyelitis, left ankle and foot: Secondary | ICD-10-CM | POA: Diagnosis not present

## 2014-10-31 DIAGNOSIS — L03116 Cellulitis of left lower limb: Secondary | ICD-10-CM | POA: Diagnosis present

## 2014-10-31 DIAGNOSIS — L98499 Non-pressure chronic ulcer of skin of other sites with unspecified severity: Secondary | ICD-10-CM | POA: Diagnosis not present

## 2014-10-31 DIAGNOSIS — Z794 Long term (current) use of insulin: Secondary | ICD-10-CM | POA: Diagnosis not present

## 2014-10-31 DIAGNOSIS — E11621 Type 2 diabetes mellitus with foot ulcer: Principal | ICD-10-CM | POA: Diagnosis present

## 2014-10-31 DIAGNOSIS — I10 Essential (primary) hypertension: Secondary | ICD-10-CM | POA: Diagnosis not present

## 2014-10-31 DIAGNOSIS — I959 Hypotension, unspecified: Secondary | ICD-10-CM | POA: Diagnosis not present

## 2014-10-31 DIAGNOSIS — Z720 Tobacco use: Secondary | ICD-10-CM | POA: Diagnosis not present

## 2014-10-31 DIAGNOSIS — Z79899 Other long term (current) drug therapy: Secondary | ICD-10-CM | POA: Diagnosis not present

## 2014-10-31 DIAGNOSIS — E1142 Type 2 diabetes mellitus with diabetic polyneuropathy: Secondary | ICD-10-CM | POA: Diagnosis not present

## 2014-10-31 DIAGNOSIS — Z833 Family history of diabetes mellitus: Secondary | ICD-10-CM

## 2014-10-31 DIAGNOSIS — I739 Peripheral vascular disease, unspecified: Secondary | ICD-10-CM | POA: Diagnosis not present

## 2014-10-31 DIAGNOSIS — L97921 Non-pressure chronic ulcer of unspecified part of left lower leg limited to breakdown of skin: Secondary | ICD-10-CM | POA: Diagnosis not present

## 2014-10-31 DIAGNOSIS — E119 Type 2 diabetes mellitus without complications: Secondary | ICD-10-CM | POA: Diagnosis not present

## 2014-10-31 DIAGNOSIS — Z902 Acquired absence of lung [part of]: Secondary | ICD-10-CM | POA: Diagnosis present

## 2014-10-31 DIAGNOSIS — Z8249 Family history of ischemic heart disease and other diseases of the circulatory system: Secondary | ICD-10-CM

## 2014-10-31 DIAGNOSIS — F17210 Nicotine dependence, cigarettes, uncomplicated: Secondary | ICD-10-CM | POA: Diagnosis present

## 2014-10-31 DIAGNOSIS — J449 Chronic obstructive pulmonary disease, unspecified: Secondary | ICD-10-CM | POA: Diagnosis not present

## 2014-10-31 DIAGNOSIS — L03316 Cellulitis of umbilicus: Secondary | ICD-10-CM | POA: Diagnosis not present

## 2014-10-31 DIAGNOSIS — L97529 Non-pressure chronic ulcer of other part of left foot with unspecified severity: Secondary | ICD-10-CM | POA: Diagnosis not present

## 2014-10-31 DIAGNOSIS — M1990 Unspecified osteoarthritis, unspecified site: Secondary | ICD-10-CM | POA: Diagnosis present

## 2014-10-31 DIAGNOSIS — R197 Diarrhea, unspecified: Secondary | ICD-10-CM

## 2014-10-31 DIAGNOSIS — Z85118 Personal history of other malignant neoplasm of bronchus and lung: Secondary | ICD-10-CM | POA: Diagnosis not present

## 2014-10-31 HISTORY — DX: Type 2 diabetes mellitus with other diabetic neurological complication: E11.49

## 2014-10-31 LAB — BASIC METABOLIC PANEL
ANION GAP: 11 (ref 5–15)
BUN: 18 mg/dL (ref 6–20)
CALCIUM: 9.8 mg/dL (ref 8.9–10.3)
CHLORIDE: 101 mmol/L (ref 101–111)
CO2: 24 mmol/L (ref 22–32)
Creatinine, Ser: 0.68 mg/dL (ref 0.44–1.00)
GFR calc Af Amer: >60 mL/min (ref >60)
Glucose, Bld: 289 mg/dL — ABNORMAL HIGH (ref 65–99)
POTASSIUM: 3.3 mmol/L — AB (ref 3.5–5.1)
Sodium: 136 mmol/L (ref 135–145)

## 2014-10-31 LAB — CBC WITH DIFFERENTIAL/PLATELET
Basophils Absolute: 0.1 10*3/uL (ref 0–0.1)
Basophils Relative: 1 %
Eosinophils Absolute: 0.3 10*3/uL (ref 0–0.7)
Eosinophils Relative: 3 %
HEMATOCRIT: 35.8 % (ref 35.0–47.0)
HEMOGLOBIN: 12.4 g/dL (ref 12.0–16.0)
Lymphocytes Relative: 41 %
Lymphs Abs: 3.5 10*3/uL (ref 1.0–3.6)
MCH: 30.5 pg (ref 26.0–34.0)
MCHC: 34.5 g/dL (ref 32.0–36.0)
MCV: 88.4 fL (ref 80.0–100.0)
MONOS PCT: 5 %
Monocytes Absolute: 0.4 10*3/uL (ref 0.2–0.9)
NEUTROS ABS: 4.4 10*3/uL (ref 1.4–6.5)
Neutrophils Relative %: 50 %
Platelets: 283 10*3/uL (ref 150–440)
RBC: 4.06 MIL/uL (ref 3.80–5.20)
RDW: 13.4 % (ref 11.5–14.5)
WBC: 8.6 10*3/uL (ref 3.6–11.0)

## 2014-10-31 MED ORDER — MORPHINE SULFATE 2 MG/ML IJ SOLN
2.0000 mg | Freq: Once | INTRAMUSCULAR | Status: AC
  Administered 2014-10-31: 2 mg via INTRAVENOUS
  Filled 2014-10-31: qty 1

## 2014-10-31 NOTE — ED Notes (Signed)
Patient with a wound to her foot times 2 weeks. Patient reports that it has become worse. Patient reports that she has had drainage.

## 2014-10-31 NOTE — ED Provider Notes (Signed)
CSN: [id number]     Arrival date & time 10/31/14  8182 History   None    Chief Complaint  Patient presents with  . Wound Check     (Consider location/radiation/quality/duration/timing/severity/associated sxs/prior Treatment) HPI 59 year old female presents to the emergency department for evaluation of left foot pain and infection. Patient has a history of osteomyelitis with surgery 1 year ago. She is type II diabetic with peripheral neuropathy. She states over the last 7 months she had a small shallow ulceration at the base of the left foot that has continued to grow and recently over the last 2 weeks has become painful and erythematous with drainage. Pain is 8 out of 10. She has had subjective low-grade fevers. She has not been on antibiotic's. She is unable to afford to get in to see her podiatrist.   Past Medical History  Diagnosis Date  . Arthritis   . Hypertension   . PAD (peripheral artery disease)   . COPD (chronic obstructive pulmonary disease)   . Cancer     Lung CA  . CHF (congestive heart failure)   . Diabetes mellitus without complication     Metformin   Past Surgical History  Procedure Laterality Date  . Abdominal hysterectomy    . Cesarean section      x3  . Foot surgery Left     diabetic ulcer  . Carpal tunnel release Bilateral   . Knee arthroscopy Bilateral   . Tonsillectomy    . Back surgery  x 7  . Video assisted thoracoscopy (vats)/thorocotomy Right 08/26/2014    Procedure: VIDEO ASSISTED THORACOSCOPY (VATS)/THOROCOTOMY;  Surgeon: Nestor Lewandowsky, MD;  Location: [facility] ORS;  Service: General;  Laterality: Right;   No family history on file. History  Substance Use Topics  . Smoking status: Former Smoker -- 0.50 packs/day    Types: Cigarettes  . Smokeless tobacco: Never Used     Comment: stopped smoking 08/25/14  . Alcohol Use: No   OB History    No data available     Review of Systems  Constitutional: Positive for fever and chills. Negative for  activity change and fatigue.  HENT: Negative for congestion, sinus pressure and sore throat.   Eyes: Negative for visual disturbance.  Respiratory: Negative for cough, chest tightness and shortness of breath.   Cardiovascular: Negative for chest pain and leg swelling.  Gastrointestinal: Negative for nausea, vomiting, abdominal pain and diarrhea.  Genitourinary: Negative for dysuria.  Musculoskeletal: Positive for gait problem. Negative for arthralgias.  Skin: Positive for wound. Negative for rash.  Neurological: Negative for weakness, numbness and headaches.  Hematological: Negative for adenopathy.  Psychiatric/Behavioral: Negative for behavioral problems, confusion and agitation.      Allergies  Penicillins; Ampicillin; Avelox; Vicodin; Etodolac; and Neurontin  Home Medications   Prior to Admission medications   Medication Sig Start Date End Date Taking? Authorizing Provider  ALPRAZolam (XANAX) 0.25 MG tablet Take 0.25 mg by mouth. 1 pill twice daily   Yes Historical Provider, MD  budesonide-formoterol (SYMBICORT) 160-4.5 MCG/ACT inhaler Inhale 2 puffs into the lungs 2 (two) times daily.   Yes Historical Provider, MD  buPROPion (WELLBUTRIN XL) 150 MG 24 hr tablet Take 150 mg by mouth daily.   Yes Historical Provider, MD  cyclobenzaprine (FLEXERIL) 10 MG tablet Take 10 mg by mouth 3 (three) times daily.    Yes Historical Provider, MD  diazepam (VALIUM) 5 MG tablet Take 5 mg by mouth daily. 08/15/14  Yes Historical Provider, MD  DULoxetine (  CYMBALTA) 60 MG capsule Take 60 mg by mouth daily.   Yes Historical Provider, MD  furosemide (LASIX) 40 MG tablet Take 40 mg by mouth daily.   Yes Historical Provider, MD  gemfibrozil (LOPID) 600 MG tablet Take 600 mg by mouth 2 (two) times daily before a meal.   Yes Historical Provider, MD  glipiZIDE (GLUCOTROL) 5 MG tablet Take 5 mg by mouth daily. 06/26/14  Yes Historical Provider, MD  HYDROcodone-acetaminophen (NORCO/VICODIN) 5-325 MG per tablet  Take 1-2 tablets by mouth every 4 (four) hours as needed for moderate pain. 10/21/14  Yes Nestor Lewandowsky, MD  lidocaine (XYLOCAINE) 5 % ointment Apply 1 application topically as needed. Apply to back prn back pain 09/26/14  Yes Historical Provider, MD  ondansetron (ZOFRAN) 4 MG tablet Take 1 tablet (4 mg total) by mouth daily as needed for nausea or vomiting. 09/25/14  Yes Lavonia Drafts, MD  propranolol (INDERAL) 40 MG tablet Take 40 mg by mouth 2 (two) times daily. 08/19/14  Yes Historical Provider, MD  Sennosides-Docusate Sodium (STOOL SOFTENER LAXATIVE PO) Take 1 capsule by mouth daily.   Yes Historical Provider, MD  SYMBICORT 80-4.5 MCG/ACT inhaler Inhale 1 puff into the lungs 2 (two) times daily. 08/04/14  Yes Historical Provider, MD  zolpidem (AMBIEN) 10 MG tablet Take 10 mg by mouth at bedtime as needed for sleep.   Yes Historical Provider, MD  fluconazole (DIFLUCAN) 100 MG tablet Take 1 tablet (100 mg total) by mouth once. 10/02/14 11/01/14  Nestor Lewandowsky, MD  insulin glargine (LANTUS) 100 UNIT/ML injection Inject 0.15 mLs (15 Units total) into the skin at bedtime. 09/02/14   Nestor Lewandowsky, MD   BP 132/84 mmHg  Pulse 75  Temp(Src) 98.4 F (36.9 C) (Oral)  Resp 18  Ht '5\' 5"'$  (1.651 m)  Wt 184 lb (83.462 kg)  BMI 30.62 kg/m2  SpO2 98% Physical Exam  Constitutional: She is oriented to person, place, and time. She appears well-developed and well-nourished. No distress.  HENT:  Head: Normocephalic and atraumatic.  Mouth/Throat: Oropharynx is clear and moist.  Eyes: EOM are normal. Pupils are equal, round, and reactive to light. Right eye exhibits no discharge. Left eye exhibits no discharge.  Neck: Normal range of motion. Neck supple.  Cardiovascular: Normal rate, regular rhythm and intact distal pulses.   Pulmonary/Chest: Effort normal and breath sounds normal. No respiratory distress. She exhibits no tenderness.  Abdominal: Soft. She exhibits no distension. There is no tenderness.   Musculoskeletal:  Examination of the left foot shows patient has no edema or swelling. There is erythema along the plantar aspect of the foot at the base of the third metatarsal extending medially to the dorsum of the foot and streaking up to the ankle. The left foot is warm to palpation. There is tenderness throughout the left foot. Plantar aspect of the left foot at the third metatarsal has a 4 cm in diameter/half a centimeter depth ulceration with mild drainage.  Neurological: She is alert and oriented to person, place, and time. She has normal reflexes.  Skin: Skin is warm and dry. There is erythema.  Psychiatric: She has a normal mood and affect. Her behavior is normal. Thought content normal.    ED Course  Procedures (including critical care time) Labs Review Labs Reviewed  BASIC METABOLIC PANEL - Abnormal; Notable for the following:    Potassium 3.3 (*)    Glucose, Bld 289 (*)    All other components within normal limits  CULTURE, BLOOD (ROUTINE X  2)  CULTURE, BLOOD (ROUTINE X 2)  CBC WITH DIFFERENTIAL/PLATELET    Imaging Review Dg Foot Complete Left  10/31/2014   CLINICAL DATA:  Diabetic ulcer at the plantar surface of the third fourth and fifth toes.  EXAM: LEFT FOOT - COMPLETE 3+ VIEW  COMPARISON:  01/23/2014  FINDINGS: Radiopaque presumed antibiotic pellets no longer visualized at the base of the second toe proximal phalanx. The second toe demonstrates evidence of previous metatarsal head resection and is diffusely osteopenic, increased since previously. No new radiopaque foreign body. Mild midfoot degenerative change. No fracture or dislocation. Soft tissue ulcer noted at the plantar surface of the foot at the level of the proximal phalanges.  IMPRESSION: Development of diffuse osteopenia of the second toe which is concerning for osteomyelitis but may be seen with disuse.  Plantar soft tissue ulcer.   Electronically Signed   By: Conchita Paris M.D.   On: 10/31/2014 21:09      EKG Interpretation None      MDM   Final diagnoses:  Foot ulceration, left, limited to breakdown of skin  Cellulitis of left foot    60 year old female with a history of diabetes and referral neuropathy resents with left foot ulceration, redness, and warmth. X-rays concerning for possible osteomyelitis. She has mild streaking of erythema along the plantar aspect of the foot and to the dorsum of the foot. We will admit to the hospital for IV antibiotic and surgical consultation.    Duanne Guess, PA-C 10/31/14 2201  Orbie Pyo, MD 11/01/14 [phone]

## 2014-11-01 ENCOUNTER — Encounter: Payer: Self-pay | Admitting: *Deleted

## 2014-11-01 DIAGNOSIS — Z833 Family history of diabetes mellitus: Secondary | ICD-10-CM | POA: Diagnosis not present

## 2014-11-01 DIAGNOSIS — I959 Hypotension, unspecified: Secondary | ICD-10-CM | POA: Diagnosis not present

## 2014-11-01 DIAGNOSIS — Z885 Allergy status to narcotic agent status: Secondary | ICD-10-CM | POA: Diagnosis not present

## 2014-11-01 DIAGNOSIS — Z888 Allergy status to other drugs, medicaments and biological substances status: Secondary | ICD-10-CM | POA: Diagnosis not present

## 2014-11-01 DIAGNOSIS — Z85118 Personal history of other malignant neoplasm of bronchus and lung: Secondary | ICD-10-CM | POA: Diagnosis not present

## 2014-11-01 DIAGNOSIS — L03116 Cellulitis of left lower limb: Secondary | ICD-10-CM | POA: Diagnosis present

## 2014-11-01 DIAGNOSIS — Z79899 Other long term (current) drug therapy: Secondary | ICD-10-CM | POA: Diagnosis not present

## 2014-11-01 DIAGNOSIS — Z8249 Family history of ischemic heart disease and other diseases of the circulatory system: Secondary | ICD-10-CM | POA: Diagnosis not present

## 2014-11-01 DIAGNOSIS — F418 Other specified anxiety disorders: Secondary | ICD-10-CM | POA: Diagnosis present

## 2014-11-01 DIAGNOSIS — I10 Essential (primary) hypertension: Secondary | ICD-10-CM | POA: Diagnosis present

## 2014-11-01 DIAGNOSIS — J449 Chronic obstructive pulmonary disease, unspecified: Secondary | ICD-10-CM | POA: Diagnosis present

## 2014-11-01 DIAGNOSIS — Z880 Allergy status to penicillin: Secondary | ICD-10-CM | POA: Diagnosis not present

## 2014-11-01 DIAGNOSIS — I739 Peripheral vascular disease, unspecified: Secondary | ICD-10-CM | POA: Diagnosis present

## 2014-11-01 DIAGNOSIS — M1990 Unspecified osteoarthritis, unspecified site: Secondary | ICD-10-CM | POA: Diagnosis present

## 2014-11-01 DIAGNOSIS — Z794 Long term (current) use of insulin: Secondary | ICD-10-CM | POA: Diagnosis not present

## 2014-11-01 DIAGNOSIS — Z79891 Long term (current) use of opiate analgesic: Secondary | ICD-10-CM | POA: Diagnosis not present

## 2014-11-01 DIAGNOSIS — Z902 Acquired absence of lung [part of]: Secondary | ICD-10-CM | POA: Diagnosis present

## 2014-11-01 DIAGNOSIS — E876 Hypokalemia: Secondary | ICD-10-CM | POA: Diagnosis not present

## 2014-11-01 DIAGNOSIS — Z7951 Long term (current) use of inhaled steroids: Secondary | ICD-10-CM | POA: Diagnosis not present

## 2014-11-01 DIAGNOSIS — F17210 Nicotine dependence, cigarettes, uncomplicated: Secondary | ICD-10-CM | POA: Diagnosis present

## 2014-11-01 DIAGNOSIS — M869 Osteomyelitis, unspecified: Secondary | ICD-10-CM | POA: Diagnosis present

## 2014-11-01 DIAGNOSIS — E1142 Type 2 diabetes mellitus with diabetic polyneuropathy: Secondary | ICD-10-CM | POA: Diagnosis present

## 2014-11-01 DIAGNOSIS — E11621 Type 2 diabetes mellitus with foot ulcer: Secondary | ICD-10-CM | POA: Diagnosis present

## 2014-11-01 LAB — GLUCOSE, CAPILLARY
Glucose-Capillary: 207 mg/dL — ABNORMAL HIGH (ref 65–99)
Glucose-Capillary: 223 mg/dL — ABNORMAL HIGH (ref 65–99)
Glucose-Capillary: 258 mg/dL — ABNORMAL HIGH (ref 65–99)
Glucose-Capillary: 247 mg/dL — ABNORMAL HIGH (ref 65–99)
Glucose-Capillary: 221 mg/dL — ABNORMAL HIGH (ref 65–99)

## 2014-11-01 LAB — HEMOGLOBIN A1C: Hgb A1c MFr Bld: 9.7 % — ABNORMAL HIGH (ref 4.0–6.0)

## 2014-11-01 LAB — TSH: TSH: 2.206 u[IU]/mL (ref 0.350–4.500)

## 2014-11-01 MED ORDER — ALPRAZOLAM 0.25 MG PO TABS
0.2500 mg | ORAL_TABLET | Freq: Two times a day (BID) | ORAL | Status: DC | PRN
  Administered 2014-11-01: 0.25 mg via ORAL
  Filled 2014-11-01: qty 1

## 2014-11-01 MED ORDER — PROMETHAZINE HCL 25 MG PO TABS: 25.0000 mg | ORAL_TABLET | Freq: Four times a day (QID) | ORAL | Status: DC | PRN

## 2014-11-01 MED ORDER — DULOXETINE HCL 60 MG PO CPEP
90.0000 mg | ORAL_CAPSULE | Freq: Every day | ORAL | Status: DC
  Administered 2014-11-01 – 2014-11-02 (×2): 90 mg via ORAL
  Filled 2014-11-01 (×2): qty 1

## 2014-11-01 MED ORDER — AZTREONAM 1 G IJ SOLR
1.0000 g | Freq: Three times a day (TID) | INTRAMUSCULAR | Status: DC
  Administered 2014-11-01 – 2014-11-02 (×3): 1 g via INTRAMUSCULAR
  Filled 2014-11-01 (×6): qty 1

## 2014-11-01 MED ORDER — POTASSIUM CHLORIDE IN NACL 20-0.9 MEQ/L-% IV SOLN
INTRAVENOUS | Status: DC
  Administered 2014-11-01: 03:00:00 via INTRAVENOUS
  Filled 2014-11-01 (×2): qty 1000

## 2014-11-01 MED ORDER — ZOLPIDEM TARTRATE 5 MG PO TABS
5.0000 mg | ORAL_TABLET | Freq: Every evening | ORAL | Status: DC | PRN
  Administered 2014-11-01: 5 mg via ORAL
  Filled 2014-11-01: qty 1

## 2014-11-01 MED ORDER — MORPHINE SULFATE 2 MG/ML IJ SOLN
2.0000 mg | INTRAMUSCULAR | Status: DC | PRN
  Administered 2014-11-01 – 2014-11-02 (×8): 2 mg via INTRAVENOUS
  Filled 2014-11-01 (×8): qty 1

## 2014-11-01 MED ORDER — ONDANSETRON HCL 4 MG/2ML IJ SOLN
INTRAMUSCULAR | Status: AC
  Filled 2014-11-01: qty 2

## 2014-11-01 MED ORDER — DULOXETINE HCL 60 MG PO CPEP: 60.0000 mg | ORAL_CAPSULE | Freq: Every day | ORAL | Status: DC

## 2014-11-01 MED ORDER — SODIUM CHLORIDE 0.9 % IV SOLN
400.0000 mg | Freq: Once | INTRAVENOUS | Status: AC
  Administered 2014-11-01: 400 mg via INTRAVENOUS
  Filled 2014-11-01: qty 8

## 2014-11-01 MED ORDER — GEMFIBROZIL 600 MG PO TABS
600.0000 mg | ORAL_TABLET | Freq: Two times a day (BID) | ORAL | Status: DC
  Administered 2014-11-01 – 2014-11-02 (×3): 600 mg via ORAL
  Filled 2014-11-01 (×3): qty 1

## 2014-11-01 MED ORDER — STERILE WATER FOR INJECTION IJ SOLN
INTRAMUSCULAR | Status: AC
  Administered 2014-11-01: 3 mL
  Filled 2014-11-01: qty 10

## 2014-11-01 MED ORDER — VANCOMYCIN HCL IN DEXTROSE 1-5 GM/200ML-% IV SOLN
1000.0000 mg | Freq: Once | INTRAVENOUS | Status: AC
  Administered 2014-11-01: 1000 mg via INTRAVENOUS
  Filled 2014-11-01: qty 200

## 2014-11-01 MED ORDER — CYCLOBENZAPRINE HCL 10 MG PO TABS
10.0000 mg | ORAL_TABLET | Freq: Three times a day (TID) | ORAL | Status: DC
  Administered 2014-11-01 – 2014-11-02 (×4): 10 mg via ORAL
  Filled 2014-11-01 (×4): qty 1

## 2014-11-01 MED ORDER — VANCOMYCIN HCL IN DEXTROSE 1-5 GM/200ML-% IV SOLN
1000.0000 mg | Freq: Two times a day (BID) | INTRAVENOUS | Status: DC
  Administered 2014-11-01: 1000 mg via INTRAVENOUS
  Filled 2014-11-01 (×4): qty 200

## 2014-11-01 MED ORDER — MORPHINE SULFATE 2 MG/ML IJ SOLN
INTRAMUSCULAR | Status: AC
  Filled 2014-11-01: qty 1

## 2014-11-01 MED ORDER — POTASSIUM CHLORIDE CRYS ER 20 MEQ PO TBCR
20.0000 meq | EXTENDED_RELEASE_TABLET | Freq: Once | ORAL | Status: AC
  Administered 2014-11-01: 10:00:00 20 meq via ORAL
  Filled 2014-11-01: qty 1

## 2014-11-01 MED ORDER — INSULIN ASPART 100 UNIT/ML ~~LOC~~ SOLN
0.0000 [IU] | Freq: Every day | SUBCUTANEOUS | Status: DC
  Administered 2014-11-01: 22:00:00 2 [IU] via SUBCUTANEOUS

## 2014-11-01 MED ORDER — DIPHENHYDRAMINE HCL 25 MG PO TABS
25.0000 mg | ORAL_TABLET | Freq: Four times a day (QID) | ORAL | Status: DC | PRN
  Filled 2014-11-01: qty 1

## 2014-11-01 MED ORDER — HYDROCODONE-ACETAMINOPHEN 5-325 MG PO TABS
1.0000 | ORAL_TABLET | ORAL | Status: DC | PRN
  Administered 2014-11-01 – 2014-11-02 (×4): 2 via ORAL
  Filled 2014-11-01 (×4): qty 2

## 2014-11-01 MED ORDER — PROPRANOLOL HCL 10 MG PO TABS
20.0000 mg | ORAL_TABLET | Freq: Two times a day (BID) | ORAL | Status: DC
  Administered 2014-11-01 – 2014-11-02 (×2): 20 mg via ORAL
  Filled 2014-11-01 (×2): qty 2

## 2014-11-01 MED ORDER — ZOLPIDEM TARTRATE 5 MG PO TABS: 10.0000 mg | ORAL_TABLET | Freq: Every evening | ORAL | Status: DC | PRN

## 2014-11-01 MED ORDER — ONDANSETRON HCL 4 MG/2ML IJ SOLN
4.0000 mg | Freq: Once | INTRAMUSCULAR | Status: AC
  Administered 2014-11-01: 4 mg via INTRAVENOUS

## 2014-11-01 MED ORDER — CLINDAMYCIN PHOSPHATE 600 MG/50ML IV SOLN
600.0000 mg | Freq: Three times a day (TID) | INTRAVENOUS | Status: DC
  Administered 2014-11-01: 10:00:00 600 mg via INTRAVENOUS
  Filled 2014-11-01 (×5): qty 50

## 2014-11-01 MED ORDER — BUDESONIDE-FORMOTEROL FUMARATE 160-4.5 MCG/ACT IN AERO
2.0000 | INHALATION_SPRAY | Freq: Two times a day (BID) | RESPIRATORY_TRACT | Status: DC
  Administered 2014-11-01 – 2014-11-02 (×3): 2 via RESPIRATORY_TRACT
  Filled 2014-11-01: qty 6

## 2014-11-01 MED ORDER — PROPRANOLOL HCL 20 MG PO TABS
40.0000 mg | ORAL_TABLET | Freq: Two times a day (BID) | ORAL | Status: DC
  Administered 2014-11-01: 09:00:00 20 mg via ORAL
  Filled 2014-11-01 (×2): qty 2

## 2014-11-01 MED ORDER — SENNOSIDES-DOCUSATE SODIUM 8.6-50 MG PO TABS
1.0000 | ORAL_TABLET | Freq: Every day | ORAL | Status: DC
  Administered 2014-11-01: 20:00:00 1 via ORAL
  Filled 2014-11-01: qty 1

## 2014-11-01 MED ORDER — FUROSEMIDE 20 MG PO TABS
40.0000 mg | ORAL_TABLET | Freq: Every day | ORAL | Status: DC
  Administered 2014-11-01 – 2014-11-02 (×2): 40 mg via ORAL
  Filled 2014-11-01 (×2): qty 2

## 2014-11-01 MED ORDER — DIAZEPAM 5 MG PO TABS
5.0000 mg | ORAL_TABLET | Freq: Every day | ORAL | Status: DC
  Administered 2014-11-01 – 2014-11-02 (×2): 5 mg via ORAL
  Filled 2014-11-01 (×2): qty 1

## 2014-11-01 MED ORDER — NICOTINE 21 MG/24HR TD PT24
21.0000 mg | MEDICATED_PATCH | Freq: Every day | TRANSDERMAL | Status: DC
  Administered 2014-11-01 – 2014-11-02 (×2): 21 mg via TRANSDERMAL
  Filled 2014-11-01 (×2): qty 1

## 2014-11-01 MED ORDER — ONDANSETRON HCL 4 MG PO TABS: 4.0000 mg | ORAL_TABLET | Freq: Every day | ORAL | Status: DC | PRN

## 2014-11-01 MED ORDER — INSULIN ASPART 100 UNIT/ML ~~LOC~~ SOLN
0.0000 [IU] | Freq: Three times a day (TID) | SUBCUTANEOUS | Status: DC
  Administered 2014-11-01: 12:00:00 8 [IU] via SUBCUTANEOUS
  Administered 2014-11-01: 09:00:00 5 [IU] via SUBCUTANEOUS
  Administered 2014-11-01: 100 [IU] via SUBCUTANEOUS
  Administered 2014-11-02: 8 [IU] via SUBCUTANEOUS
  Administered 2014-11-02: 13:00:00 11 [IU] via SUBCUTANEOUS
  Filled 2014-11-01: qty 5
  Filled 2014-11-01: qty 1
  Filled 2014-11-01: qty 5
  Filled 2014-11-01: qty 11
  Filled 2014-11-01: qty 8
  Filled 2014-11-01: qty 2
  Filled 2014-11-01: qty 8

## 2014-11-01 MED ORDER — HEPARIN SODIUM (PORCINE) 5000 UNIT/ML IJ SOLN
5000.0000 [IU] | Freq: Three times a day (TID) | INTRAMUSCULAR | Status: DC
  Administered 2014-11-01 – 2014-11-02 (×4): 5000 [IU] via SUBCUTANEOUS
  Filled 2014-11-01 (×4): qty 1

## 2014-11-01 MED ORDER — MORPHINE SULFATE 2 MG/ML IJ SOLN
2.0000 mg | Freq: Once | INTRAMUSCULAR | Status: AC
  Administered 2014-11-01: 2 mg via INTRAVENOUS

## 2014-11-01 NOTE — Progress Notes (Addendum)
ANTIBIOTIC CONSULT NOTE - INITIAL  Pharmacy Consult for Vancomycin Indication: osteomyelitis  Allergies  Allergen Reactions  . Ampicillin Anaphylaxis  . Penicillins Anaphylaxis and Shortness Of Breath  . Avelox [Moxifloxacin Hcl In Nacl] Other (See Comments)    Reaction: MUSCLE CRAMPS  . Vicodin [Hydrocodone-Acetaminophen] Nausea Only  . Etodolac Rash  . Neurontin [Gabapentin] Anxiety    Patient Measurements: Height: '5\' 5"'$  (165.1 cm) Weight: 184 lb (83.462 kg) IBW/kg (Calculated) : 57 Adjusted Body Weight: 67.6  Vital Signs: Temp: 97.6 F (36.4 C) (07/23 0515) Temp Source: Oral (07/23 0515) BP: 110/61 mmHg (07/23 0834) Pulse Rate: 68 (07/23 0834) Intake/Output from previous day: 07/22 0701 - 07/23 0700 In: 151.7 [I.V.:151.7] Out: -  Intake/Output from this shift: Total I/O In: 823.3 [P.O.:120; I.V.:653.3; IV Piggyback:50] Out: -   Labs:  Recent Labs  10/31/14 2056  WBC 8.6  HGB 12.4  PLT 283  CREATININE 0.68   Estimated Creatinine Clearance: 82.8 mL/min (by C-G formula based on Cr of 0.68).   Microbiology: No results found for this or any previous visit (from the past 720 hour(s)).  Medical History: Past Medical History  Diagnosis Date  . Arthritis   . Hypertension   . PAD (peripheral artery disease)   . COPD (chronic obstructive pulmonary disease)   . Cancer     Lung CA  . CHF (congestive heart failure)   . Diabetes mellitus type 2 with neurological manifestations     Peripheral neuropathy and foot ulcers    Medications:  Scheduled:  . aztreonam  1 g Intramuscular 3 times per day  . budesonide-formoterol  2 puff Inhalation BID  . cyclobenzaprine  10 mg Oral TID  . diazepam  5 mg Oral Daily  . DULoxetine  90 mg Oral Daily  . furosemide  40 mg Oral Daily  . gemfibrozil  600 mg Oral BID AC  . heparin  5,000 Units Subcutaneous 3 times per day  . insulin aspart  0-15 Units Subcutaneous TID WC  . insulin aspart  0-5 Units Subcutaneous QHS  .  nicotine  21 mg Transdermal Daily  . ondansetron      . propranolol  20 mg Oral BID  . senna-docusate  1 tablet Oral QHS  . vancomycin  1,000 mg Intravenous Once   Anti-infectives    Start     Dose/Rate Route Frequency Ordered Stop   11/01/14 1400  aztreonam (AZACTAM) injection 1 g     1 g Intramuscular 3 times per day 11/01/14 1105     11/01/14 1200  vancomycin (VANCOCIN) IVPB 1000 mg/200 mL premix     1,000 mg 200 mL/hr over 60 Minutes Intravenous  Once 11/01/14 1112     11/01/14 0900  clindamycin (CLEOCIN) IVPB 600 mg  Status:  Discontinued     600 mg 100 mL/hr over 30 Minutes Intravenous 3 times per day 11/01/14 0847 11/01/14 1105   11/01/14 0200  DAPTOmycin (CUBICIN) 400 mg in sodium chloride 0.9 % IVPB     400 mg 216 mL/hr over 30 Minutes Intravenous  Once 11/01/14 0055 11/01/14 0259     Assessment: 59 yo female with possible Osteomyelitis. Hx Diabetes type II. S/p one dose of daptomycin  Changing to vancomycin. Pt also ordered aztreonam at this time.   Ke 0.062, half life 11.2 h, Vd 50 L  Goal: Vancomycin 15-20 mcg/ml  Plan:  1. Will order vancomycin 1 g IV x1 then 1000 mg IV q12h to start at 2000 for stacked dosing  2. Trough before 4th dose - 7/25 at 0730 3. Will order Scr for AM, will need to continue to monitor renal function and cultures    Rayna Sexton, PharmD, BCPS Clinical Pharmacist 11/01/2014 11:14 AM

## 2014-11-01 NOTE — ED Notes (Signed)
MD at bedside. 

## 2014-11-01 NOTE — Plan of Care (Signed)
Problem: Discharge Progression Outcomes Goal: Other Discharge Outcomes/Goals Plan of care progress to goal: Pain med given x3 for left foot pain VSS Activity: independent Dressing changed to left foot with gauze and coband

## 2014-11-01 NOTE — H&P (Addendum)
Marie [name] is an 59 y.o. female.   Chief Complaint: Foot wound HPI: The patient presents emergency department complaining of pain and over from her left foot wound. The wound is chronic but has worsened. The patient reports that a large chunk of tissue fell out of the wound today. She denies vomiting or diarrhea but admits to having night sweats since the beginning of menopause. The patient also complains of nausea which is chronic likely secondary to her diabetes. In the emergency department the patient demonstrated the large portion of necrotic tissue that she removed from her foot part of which is clearly a callus. Due to x-ray evidence of osteomyelitis emergency depart and staff called for admission  Past Medical History  Diagnosis Date  . Arthritis   . Hypertension   . PAD (peripheral artery disease)   . COPD (chronic obstructive pulmonary disease)   . Cancer     Lung CA  . CHF (congestive heart failure)   . Diabetes mellitus type 2 with neurological manifestations     Peripheral neuropathy and foot ulcers    Past Surgical History  Procedure Laterality Date  . Abdominal hysterectomy    . Cesarean section      x3  . Foot surgery Left     diabetic ulcer  . Carpal tunnel release Bilateral   . Knee arthroscopy Bilateral   . Tonsillectomy    . Back surgery  x 7  . Video assisted thoracoscopy (vats)/thorocotomy Right 08/26/2014    Procedure: VIDEO ASSISTED THORACOSCOPY (VATS)/THOROCOTOMY;  Surgeon: Nestor Lewandowsky, MD;  Location: [facility] ORS;  Service: General;  Laterality: Right;    Family History  Problem Relation Age of Onset  . Hypertension Brother   . Coronary artery disease Mother   . Coronary artery disease Father   . Diabetes Mellitus II Brother   . Diabetes Mellitus II Father   . Diabetes Mellitus II Mother    Social History:  reports that she has quit smoking. Her smoking use included Cigarettes. She has a 23 pack-year smoking history. She has never used smokeless  tobacco. She reports that she does not drink alcohol or use illicit drugs.  Allergies:  Allergies  Allergen Reactions  . Ampicillin Anaphylaxis  . Penicillins Anaphylaxis and Shortness Of Breath  . Avelox [Moxifloxacin Hcl In Nacl] Other (See Comments)    Reaction: MUSCLE CRAMPS  . Vicodin [Hydrocodone-Acetaminophen] Nausea Only  . Etodolac Rash  . Neurontin [Gabapentin] Anxiety    Medications Prior to Admission  Medication Sig Dispense Refill  . ALPRAZolam (XANAX) 0.25 MG tablet Take 0.25 mg by mouth. 1 pill twice daily    . budesonide-formoterol (SYMBICORT) 160-4.5 MCG/ACT inhaler Inhale 2 puffs into the lungs 2 (two) times daily.    Marland Kitchen buPROPion (WELLBUTRIN XL) 150 MG 24 hr tablet Take 150 mg by mouth daily.    . cyclobenzaprine (FLEXERIL) 10 MG tablet Take 10 mg by mouth 3 (three) times daily.     . diazepam (VALIUM) 5 MG tablet Take 5 mg by mouth daily.    . diphenhydrAMINE (BENADRYL) 25 MG tablet Take 25 mg by mouth every 6 (six) hours as needed.    . DULoxetine (CYMBALTA) 30 MG capsule Take by mouth daily.    . DULoxetine (CYMBALTA) 60 MG capsule Take 60 mg by mouth daily.    . furosemide (LASIX) 40 MG tablet Take 40 mg by mouth daily.    Marland Kitchen gemfibrozil (LOPID) 600 MG tablet Take 600 mg by mouth 2 (two) times  daily before a meal.    . glipiZIDE (GLUCOTROL) 5 MG tablet Take 5 mg by mouth daily.    Marland Kitchen ibuprofen (ADVIL,MOTRIN) 100 MG tablet Take 200 mg by mouth every 6 (six) hours as needed for fever.    . metFORMIN (GLUCOPHAGE) 1000 MG tablet Take 1 tablet by mouth daily.    . promethazine (PHENERGAN) 25 MG tablet Take 25 mg by mouth every 6 (six) hours as needed for nausea or vomiting.    . propranolol (INDERAL) 40 MG tablet Take 40 mg by mouth 2 (two) times daily.    . Pseudoeph-CPM-DM-APAP (TYLENOL COLD) 30-2-15-325 MG TABS Take 1 tablet by mouth every 6 (six) hours as needed.    Orlie Dakin Sodium (STOOL SOFTENER LAXATIVE PO) Take 1 capsule by mouth daily.    .  SYMBICORT 80-4.5 MCG/ACT inhaler Inhale 1 puff into the lungs 2 (two) times daily.    Marland Kitchen zolpidem (AMBIEN) 10 MG tablet Take 10 mg by mouth at bedtime as needed for sleep.    . fluconazole (DIFLUCAN) 100 MG tablet Take 1 tablet (100 mg total) by mouth once. 1 tablet 0  . HYDROcodone-acetaminophen (NORCO/VICODIN) 5-325 MG per tablet Take 1-2 tablets by mouth every 4 (four) hours as needed for moderate pain. (Patient not taking: Reported on 10/31/2014) 40 tablet 0  . insulin glargine (LANTUS) 100 UNIT/ML injection Inject 0.15 mLs (15 Units total) into the skin at bedtime. 10 mL 11  . lidocaine (XYLOCAINE) 5 % ointment Apply 1 application topically as needed. Apply to back prn back pain    . ondansetron (ZOFRAN) 4 MG tablet Take 1 tablet (4 mg total) by mouth daily as needed for nausea or vomiting. (Patient not taking: Reported on 10/31/2014) 20 tablet 1    Results for orders placed or performed during the hospital encounter of 10/31/14 (from the past 48 hour(s))  CBC with Differential     Status: None   Collection Time: 10/31/14  8:56 PM  Result Value Ref Range   WBC 8.6 3.6 - 11.0 K/uL   RBC 4.06 3.80 - 5.20 MIL/uL   Hemoglobin 12.4 12.0 - 16.0 g/dL   HCT 35.8 35.0 - 47.0 %   MCV 88.4 80.0 - 100.0 fL   MCH 30.5 26.0 - 34.0 pg   MCHC 34.5 32.0 - 36.0 g/dL   RDW 13.4 11.5 - 14.5 %   Platelets 283 150 - 440 K/uL   Neutrophils Relative % 50 %   Neutro Abs 4.4 1.4 - 6.5 K/uL   Lymphocytes Relative 41 %   Lymphs Abs 3.5 1.0 - 3.6 K/uL   Monocytes Relative 5 %   Monocytes Absolute 0.4 0.2 - 0.9 K/uL   Eosinophils Relative 3 %   Eosinophils Absolute 0.3 0 - 0.7 K/uL   Basophils Relative 1 %   Basophils Absolute 0.1 0 - 0.1 K/uL  Basic metabolic panel     Status: Abnormal   Collection Time: 10/31/14  8:56 PM  Result Value Ref Range   Sodium 136 135 - 145 mmol/L   Potassium 3.3 (L) 3.5 - 5.1 mmol/L   Chloride 101 101 - 111 mmol/L   CO2 24 22 - 32 mmol/L   Glucose, Bld 289 (H) 65 - 99 mg/dL    BUN 18 6 - 20 mg/dL   Creatinine, Ser 0.68 0.44 - 1.00 mg/dL   Calcium 9.8 8.9 - 10.3 mg/dL   GFR calc non Af Amer >60 >60 mL/min   GFR calc Af Amer >60 >  60 mL/min    Comment: (NOTE) The eGFR has been calculated using the CKD EPI equation. This calculation has not been validated in all clinical situations. eGFR's persistently <60 mL/min signify possible Chronic Kidney Disease.    Anion gap 11 5 - 15  Glucose, capillary     Status: Abnormal   Collection Time: 11/01/14  1:28 AM  Result Value Ref Range   Glucose-Capillary 207 (H) 65 - 99 mg/dL   Dg Foot Complete Left  10/31/2014   CLINICAL DATA:  Diabetic ulcer at the plantar surface of the third fourth and fifth toes.  EXAM: LEFT FOOT - COMPLETE 3+ VIEW  COMPARISON:  01/23/2014  FINDINGS: Radiopaque presumed antibiotic pellets no longer visualized at the base of the second toe proximal phalanx. The second toe demonstrates evidence of previous metatarsal head resection and is diffusely osteopenic, increased since previously. No new radiopaque foreign body. Mild midfoot degenerative change. No fracture or dislocation. Soft tissue ulcer noted at the plantar surface of the foot at the level of the proximal phalanges.  IMPRESSION: Development of diffuse osteopenia of the second toe which is concerning for osteomyelitis but may be seen with disuse.  Plantar soft tissue ulcer.   Electronically Signed   By: Conchita Paris M.D.   On: 10/31/2014 21:09    Review of Systems  Constitutional: Positive for chills. Negative for fever.  HENT: Negative for sore throat and tinnitus.   Eyes: Negative for blurred vision and redness.  Respiratory: Negative for cough and shortness of breath.   Cardiovascular: Negative for chest pain, palpitations, orthopnea and PND.  Gastrointestinal: Negative for nausea, vomiting, abdominal pain and diarrhea.  Genitourinary: Negative for dysuria, urgency and frequency.  Musculoskeletal: Negative for myalgias and joint pain.   Skin: Negative for rash.       No lesions  Neurological: Positive for tingling (In feet). Negative for speech change, focal weakness and weakness.  Endo/Heme/Allergies: Does not bruise/bleed easily.       No temperature intolerance  Psychiatric/Behavioral: Negative for depression and suicidal ideas.    Blood pressure 124/63, pulse 70, temperature 98.1 F (36.7 C), temperature source Oral, resp. rate 18, height $RemoveBe'5\' 5"'iFhEEQBTQ$  (1.651 m), weight 83.462 kg (184 lb), SpO2 97 %. Physical Exam  Nursing note and vitals reviewed. Constitutional: She is oriented to person, place, and time. She appears well-developed and well-nourished. No distress.  HENT:  Head: Normocephalic and atraumatic.  Mouth/Throat: Oropharynx is clear and moist.  Eyes: Conjunctivae and EOM are normal. Pupils are equal, round, and reactive to light. No scleral icterus.  Neck: Normal range of motion. Neck supple. No JVD present. No tracheal deviation present. No thyromegaly present.  Cardiovascular: Normal rate, regular rhythm and normal heart sounds.  Exam reveals no gallop and no friction rub.   No murmur heard. Respiratory: Effort normal and breath sounds normal.  GI: Soft. Bowel sounds are normal. She exhibits no distension. There is no tenderness.  Genitourinary:  Deferred  Musculoskeletal: Normal range of motion. She exhibits no edema.  Lymphadenopathy:    She has no cervical adenopathy.  Neurological: She is alert and oriented to person, place, and time. No cranial nerve deficit. She exhibits normal muscle tone.  Skin: Skin is warm and dry.  1.5 cm diameter ulcer with necrotic tissue on ball of left foot  Psychiatric: She has a normal mood and affect. Her behavior is normal. Judgment and thought content normal.     Assessment/Plan This is a 59 year old Caucasian female admitted for osteomyelitis  of the left foot. 1. Osteomyelitis: I started the patient on daptomycin. She does not have criteria for sepsis at this time.  I have ordered a podiatry consult. Patient may need surgical consult for possible amputation even the location of her bone infection. 2. Diabetes mellitus type 2: Skin ulcers due to peripheral neuropathy. I have held the patient's metformin and glipizide and started her on sliding scale insulin while hospitalized. She reports that long-acting insulin caused acute kidney injury and/or urinary retention. Check hemoglobin A1c 3. Hypertension: Continue propranolol per patient's home regimen. Blood pressure is controlled at this time. 4. Tobacco abuse: Despite history of lung cancer status post wedge resection the patient has resumed smoking. NicoDerm patch for smoking cessation while hospitalized 5. DVT prophylaxis: Heparin 6. GI prophylaxis: None The patient is a full code. Time spent on admission orders and patient care approximately 35 minutes  Harrie Foreman 11/01/2014, 4:34 AM

## 2014-11-01 NOTE — Plan of Care (Signed)
Problem: Discharge Progression Outcomes Goal: Discharge plan in place and appropriate Individualization of Care Pt prefers to be called Santiago Glad Pt has history of arthritis, HTN, PAD, COPD, lung Cancer, CHF, DM on home meds Pt is moderate 10+ fall recautions  Goal: Other Discharge Outcomes/Goals Plan of Care Progress to Goal:  Pt admitted to room 116 for osteomylitis of foot and infection, pain in left foot, foot dressed with clean gauze at present time.

## 2014-11-01 NOTE — Progress Notes (Signed)
Patient ID: Marie [name], female   DOB: January 30, 1956, 59 y.o.   MRN: [id number] [facility] PROGRESS NOTE  PCP: Cletis Athens, MD  HPI/Subjective: Patient with fever and chills at home. Patient has pain in the foot. Bleeding when she walks.  Objective: Filed Vitals:   11/01/14 0834  BP: 110/61  Pulse: 68  Temp:   Resp:     Intake/Output Summary (Last 24 hours) at 11/01/14 0848 Last data filed at 11/01/14 0428  Gross per 24 hour  Intake 151.67 ml  Output      0 ml  Net 151.67 ml   Filed Weights   10/31/14 2015 11/01/14 0515  Weight: 83.462 kg (184 lb) 83.462 kg (184 lb)    ROS: Review of Systems  Constitutional: Negative for fever and chills.  Eyes: Negative for blurred vision.  Respiratory: Negative for cough and shortness of breath.   Cardiovascular: Negative for chest pain.  Gastrointestinal: Negative for nausea, vomiting, abdominal pain, diarrhea and constipation.  Genitourinary: Negative for dysuria.  Musculoskeletal: Positive for joint pain.  Neurological: Negative for dizziness and headaches.   Exam: Physical Exam  Constitutional: She is oriented to person, place, and time.  HENT:  Head: Normocephalic.  Nose: No mucosal edema.  Mouth/Throat: No oropharyngeal exudate or posterior oropharyngeal edema.  Eyes: Conjunctivae, EOM and lids are normal. Pupils are equal, round, and reactive to light.  Neck: No JVD present. Carotid bruit is not present. No edema present. No thyroid mass and no thyromegaly present.  Cardiovascular: S1 normal and S2 normal.  Exam reveals no gallop.   No murmur heard. Pulses:      Dorsalis pedis pulses are 1+ on the right side, and 0 on the left side.  Respiratory: No respiratory distress. She has no wheezes. She has no rhonchi. She has no rales.  GI: Soft. Bowel sounds are normal. There is no tenderness.  Musculoskeletal:       Right ankle: She exhibits no swelling.       Left ankle: She exhibits no swelling.   Lymphadenopathy:    She has no cervical adenopathy.  Neurological: She is alert and oriented to person, place, and time. No cranial nerve deficit.  Skin: Skin is warm. Nails show no clubbing.  Large ulcer on the bottom of her left foot. Some darkened area around the edges.  Psychiatric: She has a normal mood and affect.    Data Reviewed: Basic Metabolic Panel:  Recent Labs Lab 10/31/14 2056  NA 136  K 3.3*  CL 101  CO2 24  GLUCOSE 289*  BUN 18  CREATININE 0.68  CALCIUM 9.8   CBC:  Recent Labs Lab 10/31/14 2056  WBC 8.6  NEUTROABS 4.4  HGB 12.4  HCT 35.8  MCV 88.4  PLT 283    CBG:  Recent Labs Lab 11/01/14 0128 11/01/14 0659  GLUCAP 207* 223*    Studies: Dg Foot Complete Left  10/31/2014   CLINICAL DATA:  Diabetic ulcer at the plantar surface of the third fourth and fifth toes.  EXAM: LEFT FOOT - COMPLETE 3+ VIEW  COMPARISON:  01/23/2014  FINDINGS: Radiopaque presumed antibiotic pellets no longer visualized at the base of the second toe proximal phalanx. The second toe demonstrates evidence of previous metatarsal head resection and is diffusely osteopenic, increased since previously. No new radiopaque foreign body. Mild midfoot degenerative change. No fracture or dislocation. Soft tissue ulcer noted at the plantar surface of the foot at the level of the proximal phalanges.  IMPRESSION:  Development of diffuse osteopenia of the second toe which is concerning for osteomyelitis but may be seen with disuse.  Plantar soft tissue ulcer.   Electronically Signed   By: Conchita Paris M.D.   On: 10/31/2014 21:09    Scheduled Meds: . budesonide-formoterol  2 puff Inhalation BID  . clindamycin (CLEOCIN) IV  600 mg Intravenous 3 times per day  . cyclobenzaprine  10 mg Oral TID  . diazepam  5 mg Oral Daily  . DULoxetine  90 mg Oral Daily  . furosemide  40 mg Oral Daily  . gemfibrozil  600 mg Oral BID AC  . heparin  5,000 Units Subcutaneous 3 times per day  . insulin  aspart  0-15 Units Subcutaneous TID WC  . insulin aspart  0-5 Units Subcutaneous QHS  . nicotine  21 mg Transdermal Daily  . ondansetron      . potassium chloride  20 mEq Oral Once  . propranolol  20 mg Oral BID  . senna-docusate  1 tablet Oral QHS    Assessment/Plan:  1. Nonhealing ulcer of the left foot. Fever at home. Admitting physician put on daptomycin. I will add clindamycin to cover anaerobes and some gram negatives also. Agree with podiatry consultation. May end up needing an MRI of the foot to rule out osteomyelitis. Patient had a prior surgery on her foot which may obscure the findings on the x-ray. But since this wound has been going on for a long period of time this could be an osteomyelitis. May also end up needing a vascular surgery consultation because I'm unable to palpate dorsalis pedis pulses or posterior tibial pulses. I will wait for podiatry opinion first. 2. Type 2 diabetes with neuropathy- patient put on sliding scale while here. I will check a hemoglobin A1c. 3. Tobacco abuse smoking cessation counseling done 3 minutes by me- nicotine patch ordered. 4. Hypokalemia-  replace potassium orally. 5. Relative hypotension- decreased propranolol dose to 20 mg twice a day. 6. History of lung cancer status post wedge resection of lung. 7. Anxiety depression continue usual medications  Code Status:     Code Status Orders        Start     Ordered   11/01/14 0124  Full code   Continuous     11/01/14 0123     Disposition Plan: Home once infection improved  Consultants:  Podiatry  Time spent: 35 minutes  Loletha Grayer  [facility]

## 2014-11-01 NOTE — Progress Notes (Signed)
Initial Nutrition Assessment     INTERVENTION:  Meals and Snacks: Cater to patient preferences Medical Food Supplement Therapy: pt would like to try supplement for when appetite is down;  recommend NoSugarAdded Carnation Instant Breakfast BID between meals Education: briefly reviewed diabetic diet with pt; pt verbalizes that she understands diet; reinforced importance of good glucose management with regards to wound healing    NUTRITION DIAGNOSIS:   No nutrition diagnosis at this time  GOAL:   Patient will meet greater than or equal to 90% of their needs  MONITOR:    (Energy Intake, Anthropometrics, Electrolyte/Renal Profile, Glucose Profile, Digestive System)  REASON FOR ASSESSMENT:   Malnutrition Screening Tool    ASSESSMENT:    Pt admitted with non-healing ulcer of left foot   Past Medical History  Diagnosis Date  . Arthritis   . Hypertension   . PAD (peripheral artery disease)   . COPD (chronic obstructive pulmonary disease)   . Cancer     Lung CA  . CHF (congestive heart failure)   . Diabetes mellitus type 2 with neurological manifestations     Peripheral neuropathy and foot ulcers    Diet Order:  Diet heart healthy/carb modified Room service appropriate?: Yes; Fluid consistency:: Thin   Energy Intake: recorded po intake 100% at breakfast this AM; pt reports good appetite at present  Food and nutrition related history: pt reports she typically eats 2 meals per day at home, no nutritional supplements. She typically eats pancakes for breakfast in AM, may or may not eat lunch (sometimes she just snacks on yogurt or peanut butter crackers, sometimes she will eat chicken nuggets or whatever her 57 year old grandson eats), typically eats good dinner meal of 1 meat and 2 veggies  Electrolyte and Renal Profile:   Recent Labs Lab 10/31/14 2056  BUN 18  CREATININE 0.68  NA 136  K 3.3*   Glucose Profile:  Recent Labs  11/01/14 0128 11/01/14 0659  11/01/14 1122  GLUCAP 207* 223* 258*   Meds: senokot, ss novolog, lasix  Skin:   (nonhealing ulcer on left foot)  Last BM:  7/21   Nutrition focused physical exam: Nutrition-Focused physical exam completed. Findings are WDL for fat depletion, muscle depletion, and edema.    Height:   Ht Readings from Last 1 Encounters:  10/31/14 '5\' 5"'$  (1.651 m)    Weight: pt reports she weighed 190 pounds before her lung surgery in May; 3.2% wt loss  Wt Readings from Last 1 Encounters:  11/01/14 184 lb (83.462 kg)    Filed Weights   10/31/14 2015 11/01/14 0515  Weight: 184 lb (83.462 kg) 184 lb (83.462 kg)     Wt Readings from Last 10 Encounters:  11/01/14 184 lb (83.462 kg)  10/02/14 183 lb (83.008 kg)  09/25/14 184 lb (83.462 kg)  09/16/14 184 lb 1.4 oz (83.5 kg)  09/11/14 204 lb (92.534 kg)  08/27/14 206 lb 2.1 oz (93.5 kg)  08/19/14 190 lb (86.183 kg)  06/21/13 190 lb (86.183 kg)  05/17/13 186 lb (84.369 kg)  04/19/13 180 lb (81.647 kg)    BMI:  Body mass index is 30.62 kg/(m^2).  LOW Care Level  Kerman Passey MS, [place], LDN [phone] Pager

## 2014-11-01 NOTE — Progress Notes (Signed)
ANTIBIOTIC CONSULT NOTE - INITIAL  Pharmacy Consult for DAPTOMYCIN Indication: osteomyelitis  Allergies  Allergen Reactions  . Ampicillin Anaphylaxis  . Penicillins Anaphylaxis and Shortness Of Breath  . Avelox [Moxifloxacin Hcl In Nacl] Other (See Comments)    Reaction: MUSCLE CRAMPS  . Vicodin [Hydrocodone-Acetaminophen] Nausea Only  . Etodolac Rash  . Neurontin [Gabapentin] Anxiety    Patient Measurements: Height: '5\' 5"'$  (165.1 cm) Weight: 184 lb (83.462 kg) IBW/kg (Calculated) : 57 Adjusted Body Weight: 67.6  Vital Signs: Temp: 98.4 F (36.9 C) (07/22 2015) Temp Source: Oral (07/22 2015) BP: 119/77 mmHg (07/22 2358) Pulse Rate: 66 (07/22 2358) Intake/Output from previous day:   Intake/Output from this shift:    Labs:  Recent Labs  10/31/14 2056  WBC 8.6  HGB 12.4  PLT 283  CREATININE 0.68   Estimated Creatinine Clearance: 82.8 mL/min (by C-G formula based on Cr of 0.68).   Microbiology: No results found for this or any previous visit (from the past 720 hour(s)).  Medical History: Past Medical History  Diagnosis Date  . Arthritis   . Hypertension   . PAD (peripheral artery disease)   . COPD (chronic obstructive pulmonary disease)   . Cancer     Lung CA  . CHF (congestive heart failure)   . Diabetes mellitus without complication     Metformin    Medications:  Scheduled:  . DAPTOmycin (CUBICIN)  IV  400 mg Intravenous Once   Anti-infectives    Start     Dose/Rate Route Frequency Ordered Stop   11/01/14 0200  DAPTOmycin (CUBICIN) 400 mg in sodium chloride 0.9 % IVPB     400 mg 216 mL/hr over 30 Minutes Intravenous  Once 11/01/14 0055       Assessment: 59 yo female with possible Osteomyelitis.Hx Diabetes type II.  Plan:  Will order 1 dose of Daptomycin 6 mg/kg= '400mg'$  IV.  (Daptomycin is restricted to ID physician and Nephrologist ordering. ID approval required for all other providers. One dose may be ordered between 5:00pm and 0800 without  ID approval.)  Chinita Greenland PharmD Clinical Pharmacist 11/01/2014

## 2014-11-01 NOTE — Consult Note (Signed)
Patient Demographics  Marie [name], is a 59 y.o. female   MRN: [id number]   DOB - 1956/03/18  Admit Date - 10/31/2014    Outpatient Primary MD for the patient is MASOUD,JAVED, MD  Consult requested in the Hospital by Loletha Grayer, MD, On 11/01/2014    Reason for consult diabetic foot ulcer left foot   With History of -  Past Medical History  Diagnosis Date  . Arthritis   . Hypertension   . PAD (peripheral artery disease)   . COPD (chronic obstructive pulmonary disease)   . Cancer     Lung CA  . CHF (congestive heart failure)   . Diabetes mellitus type 2 with neurological manifestations     Peripheral neuropathy and foot ulcers   previous history of osteomyelitis second metatarsal head. Dr. Vickki Muff did second metatarsal head resection and hammertoe repair left foot last year.   Past Surgical History  Procedure Laterality Date  . Abdominal hysterectomy    . Cesarean section      x3  . Foot surgery Left     diabetic ulcer  . Carpal tunnel release Bilateral   . Knee arthroscopy Bilateral   . Tonsillectomy    . Back surgery  x 7  . Video assisted thoracoscopy (vats)/thorocotomy Right 08/26/2014    Procedure: VIDEO ASSISTED THORACOSCOPY (VATS)/THOROCOTOMY;  Surgeon: Nestor Lewandowsky, MD;  Location: [facility] ORS;  Service: General;  Laterality: Right;    in for   Chief Complaint  Patient presents with  . Wound Check     HPI  Marie [name]  is a 59 y.o. female,     Review of Systems    In addition to the HPI above,  No Fever-chills, No Headache, No changes with Vision or hearing, No problems swallowing food or Liquids, No Chest pain, Cough or Shortness of Breath, No Abdominal pain, No Nausea or Vommitting, Bowel movements are regular, No Blood in stool or Urine, No dysuria, No new skin  rashes or bruises, No new joints pains-aches,  No new weakness, tingling, numbness in any extremity, No recent weight gain or loss, No polyuria, polydypsia or polyphagia, No significant Mental Stressors.  A full 10 point Review of Systems was done, except as stated above, all other Review of Systems were negative.   Social History History  Substance Use Topics  . Smoking status: Former Smoker -- 0.50 packs/day for 46 years    Types: Cigarettes  . Smokeless tobacco: Never Used     Comment: stopped smoking 08/25/14  . Alcohol Use: No     Family History Family History  Problem Relation Age of Onset  . Hypertension Brother   . Coronary artery disease Mother   . Coronary artery disease Father   . Diabetes Mellitus II Brother   . Diabetes Mellitus II Father   . Diabetes Mellitus II Mother     Prior to Admission medications   Medication Sig Start Date End Date Taking? Authorizing Provider  ALPRAZolam Duanne Moron) 0.25 MG tablet Take  0.25 mg by mouth. 1 pill twice daily   Yes Historical Provider, MD  budesonide-formoterol (SYMBICORT) 160-4.5 MCG/ACT inhaler Inhale 2 puffs into the lungs 2 (two) times daily.   Yes Historical Provider, MD  buPROPion (WELLBUTRIN XL) 150 MG 24 hr tablet Take 150 mg by mouth daily.   Yes Historical Provider, MD  cyclobenzaprine (FLEXERIL) 10 MG tablet Take 10 mg by mouth 3 (three) times daily.    Yes Historical Provider, MD  diazepam (VALIUM) 5 MG tablet Take 5 mg by mouth daily. 08/15/14  Yes Historical Provider, MD  diphenhydrAMINE (BENADRYL) 25 MG tablet Take 25 mg by mouth every 6 (six) hours as needed.   Yes Historical Provider, MD  DULoxetine (CYMBALTA) 30 MG capsule Take by mouth daily. 09/29/14  Yes Historical Provider, MD  DULoxetine (CYMBALTA) 60 MG capsule Take 60 mg by mouth daily.   Yes Historical Provider, MD  furosemide (LASIX) 40 MG tablet Take 40 mg by mouth daily.   Yes Historical Provider, MD  gemfibrozil (LOPID) 600 MG tablet Take 600 mg by  mouth 2 (two) times daily before a meal.   Yes Historical Provider, MD  glipiZIDE (GLUCOTROL) 5 MG tablet Take 5 mg by mouth daily. 06/26/14  Yes Historical Provider, MD  ibuprofen (ADVIL,MOTRIN) 100 MG tablet Take 200 mg by mouth every 6 (six) hours as needed for fever.   Yes Historical Provider, MD  metFORMIN (GLUCOPHAGE) 1000 MG tablet Take 1 tablet by mouth daily. 10/02/14  Yes Historical Provider, MD  promethazine (PHENERGAN) 25 MG tablet Take 25 mg by mouth every 6 (six) hours as needed for nausea or vomiting.   Yes Historical Provider, MD  propranolol (INDERAL) 40 MG tablet Take 40 mg by mouth 2 (two) times daily. 08/19/14  Yes Historical Provider, MD  Pseudoeph-CPM-DM-APAP (TYLENOL COLD) 30-2-15-325 MG TABS Take 1 tablet by mouth every 6 (six) hours as needed.   Yes Historical Provider, MD  Sennosides-Docusate Sodium (STOOL SOFTENER LAXATIVE PO) Take 1 capsule by mouth daily.   Yes Historical Provider, MD  SYMBICORT 80-4.5 MCG/ACT inhaler Inhale 1 puff into the lungs 2 (two) times daily. 08/04/14  Yes Historical Provider, MD  zolpidem (AMBIEN) 10 MG tablet Take 10 mg by mouth at bedtime as needed for sleep.   Yes Historical Provider, MD  fluconazole (DIFLUCAN) 100 MG tablet Take 1 tablet (100 mg total) by mouth once. 10/02/14 11/01/14  Nestor Lewandowsky, MD  HYDROcodone-acetaminophen (NORCO/VICODIN) 5-325 MG per tablet Take 1-2 tablets by mouth every 4 (four) hours as needed for moderate pain. Patient not taking: Reported on 10/31/2014 10/21/14   Nestor Lewandowsky, MD  insulin glargine (LANTUS) 100 UNIT/ML injection Inject 0.15 mLs (15 Units total) into the skin at bedtime. 09/02/14   Nestor Lewandowsky, MD  lidocaine (XYLOCAINE) 5 % ointment Apply 1 application topically as needed. Apply to back prn back pain 09/26/14   Historical Provider, MD  ondansetron (ZOFRAN) 4 MG tablet Take 1 tablet (4 mg total) by mouth daily as needed for nausea or vomiting. Patient not taking: Reported on 10/31/2014 09/25/14   Lavonia Drafts,  MD    Anti-infectives    Start     Dose/Rate Route Frequency Ordered Stop   11/01/14 2000  vancomycin (VANCOCIN) IVPB 1000 mg/200 mL premix     1,000 mg 200 mL/hr over 60 Minutes Intravenous Every 12 hours 11/01/14 1117     11/01/14 1400  aztreonam (AZACTAM) injection 1 g     1 g Intramuscular 3 times per day 11/01/14 1105  11/01/14 1200  vancomycin (VANCOCIN) IVPB 1000 mg/200 mL premix     1,000 mg 200 mL/hr over 60 Minutes Intravenous  Once 11/01/14 1112     11/01/14 0900  clindamycin (CLEOCIN) IVPB 600 mg  Status:  Discontinued     600 mg 100 mL/hr over 30 Minutes Intravenous 3 times per day 11/01/14 0847 11/01/14 1105   11/01/14 0200  DAPTOmycin (CUBICIN) 400 mg in sodium chloride 0.9 % IVPB     400 mg 216 mL/hr over 30 Minutes Intravenous  Once 11/01/14 0055 11/01/14 0259      Scheduled Meds: . aztreonam  1 g Intramuscular 3 times per day  . budesonide-formoterol  2 puff Inhalation BID  . cyclobenzaprine  10 mg Oral TID  . diazepam  5 mg Oral Daily  . DULoxetine  90 mg Oral Daily  . furosemide  40 mg Oral Daily  . gemfibrozil  600 mg Oral BID AC  . heparin  5,000 Units Subcutaneous 3 times per day  . insulin aspart  0-15 Units Subcutaneous TID WC  . insulin aspart  0-5 Units Subcutaneous QHS  . nicotine  21 mg Transdermal Daily  . ondansetron      . propranolol  20 mg Oral BID  . senna-docusate  1 tablet Oral QHS  . vancomycin  1,000 mg Intravenous Once  . vancomycin  1,000 mg Intravenous Q12H   Continuous Infusions:  PRN Meds:.ALPRAZolam, diphenhydrAMINE, HYDROcodone-acetaminophen, morphine injection, ondansetron, promethazine, zolpidem  Allergies  Allergen Reactions  . Ampicillin Anaphylaxis  . Penicillins Anaphylaxis and Shortness Of Breath  . Avelox [Moxifloxacin Hcl In Nacl] Other (See Comments)    Reaction: MUSCLE CRAMPS  . Vicodin [Hydrocodone-Acetaminophen] Nausea Only  . Etodolac Rash  . Neurontin [Gabapentin] Anxiety    Physical  Exam  Vitals  Blood pressure 110/61, pulse 68, temperature 97.6 F (36.4 C), temperature source Oral, resp. rate 18, height '5\' 5"'$  (1.651 m), weight 83.462 kg (184 lb), SpO2 100 %.  Lower Extremity exam:  Vascular: DP and PT pulses are difficult to palpate bilaterally. She states she's had a vascular exam to the lower extremities prior to cancer surgery. Have to review that. Color to the toes is good capillary refill times R3 seconds to the great toes bilaterally.  Dermatological: Patient has hemorrhagic dermal tissue on the plantar aspect of the left foot. This encompasses an area approximately 2-1/2 cm in diameter. Debridement of this with a 15 blade yields removal of significant hyperkeratotic buildup and also some damaged skin from the region. There is a central area where the skin is broken down to the deeper dermal layers but does not penetrate through the skin into the fatty or subcutaneous tissues. There is no sinus tracking no undermining at this point. No redness or drainage from the area to juncture. Apparently there was some redness to the top of the foot yesterday but very minimal redness or swelling to the left foot at this timeframe.  Neurological: Patient seems to have significant feeling to the plantar left foot as she states that her did when I debrided the hyperkeratotic buildup. I do think however that she has significant peripheral neuropathy otherwise these lesions were not develop.  Ortho: Scar line over the second metatarsal and second toe where part of the metatarsal head was resected as well as the base of the proximal phalanx. She still has contracture of the third toe and somewhat lesser extent the fourth toe of the left foot. The ulcerative areas more submetatarsal 3  then submetatarsal 2 at this point.  X-rays: I reviewed the x-rays of the left foot. Previous resection of part of the metatarsal head and base of the proximal phalanx were noted. These do not appear to be  actively infected to me at this timeframe. Third metatarsal head is prominent than the severe hammertoe contractures noted on this third and fourth toes left foot. This will also increased retrograde pressure on the metatarsal heads.  Data Review  CBC  Recent Labs Lab 10/31/14 2056  WBC 8.6  HGB 12.4  HCT 35.8  PLT 283  MCV 88.4  MCH 30.5  MCHC 34.5  RDW 13.4  LYMPHSABS 3.5  MONOABS 0.4  EOSABS 0.3  BASOSABS 0.1   ------------------------------------------------------------------------------------------------------------------  Chemistries   Recent Labs Lab 10/31/14 2056  NA 136  K 3.3*  CL 101  CO2 24  GLUCOSE 289*  BUN 18  CREATININE 0.68  CALCIUM 9.8   ------------------------------------------------------------------------------------------------------------------ estimated creatinine clearance is 82.8 mL/min (by C-G formula based on Cr of 0.68). ------------------------------------------------------------------------------------------------------------------  Recent Labs  10/31/14 2056  TSH 2.206     Coagulation profile No results for input(s): INR, PROTIME in the last 168 hours. ------------------------------------------------------------------------------------------------------------------- No results for input(s): DDIMER in the last 72 hours. -------------------------------------------------------------------------------------------------------------------  Cardiac Enzymes No results for input(s): CKMB, TROPONINI, MYOGLOBIN in the last 168 hours.  Invalid input(s): CK ------------------------------------------------------------------------------------------------------------------ Invalid input(s): POCBNP   ---------------------------------------------------------------------------------------------------------------  Urinalysis    Component Value Date/Time   COLORURINE Yellow 01/23/2014 0016   APPEARANCEUR Hazy 01/23/2014 0016   LABSPEC  1.015 01/23/2014 0016   PHURINE 5.0 01/23/2014 0016   GLUCOSEU Negative 01/23/2014 0016   HGBUR Negative 01/23/2014 0016   BILIRUBINUR Negative 01/23/2014 0016   KETONESUR Negative 01/23/2014 0016   PROTEINUR Negative 01/23/2014 0016   NITRITE Negative 01/23/2014 0016   LEUKOCYTESUR Negative 01/23/2014 0016     Imaging results:   Dg Foot Complete Left  10/31/2014   CLINICAL DATA:  Diabetic ulcer at the plantar surface of the third fourth and fifth toes.  EXAM: LEFT FOOT - COMPLETE 3+ VIEW  COMPARISON:  01/23/2014  FINDINGS: Radiopaque presumed antibiotic pellets no longer visualized at the base of the second toe proximal phalanx. The second toe demonstrates evidence of previous metatarsal head resection and is diffusely osteopenic, increased since previously. No new radiopaque foreign body. Mild midfoot degenerative change. No fracture or dislocation. Soft tissue ulcer noted at the plantar surface of the foot at the level of the proximal phalanges.  IMPRESSION: Development of diffuse osteopenia of the second toe which is concerning for osteomyelitis but may be seen with disuse.  Plantar soft tissue ulcer.   Electronically Signed   By: Conchita Paris M.D.   On: 10/31/2014 21:09   Please see my radiographic interpretations above.     Assessment & Plan  Active Problems:   Osteomyelitis  while osteomyelitis is possible with this patient with clinical exam and radiographic exam do not lead me to think that is the current situation. I do think she has a diabetic ulceration that has remained fairly superficial. This is present because of structural problems with the foot and a prominent third metatarsal head. This will need to be repaired but is not an urgent or acute situation. Following debridement of the wound dressed the foot with a heavy gauze dressing wet to dry. I also ordered a OrthoWedge shoe for her to utilize. I explained to her the likelihood that we get her in the office this coming  week and  consider surgery in the next week or 2 to repair this problem. I would like to review any vascular studies she's had done before the surgery was performed. Currently, I feel like she is stable. Must use OrthoWedge shoe at all times when bearing weight or ambulating. Recommend continued dressings to the foot as well.      Family Communication: Plan discussed with patient . I explained to her that this is not an acute problem and we need to let the wound heal up completely before any elective surgery is done at this timeframe. I would like to follow her in the office this coming week.   Thank you for the consult, we will follow the patient with you in the Hospital.   Perry Mount M.D on 11/01/2014 at 11:27 AM  Thank you for the consult, we will follow the patient with you in the Hospital.

## 2014-11-01 NOTE — Progress Notes (Signed)
Clinical Education officer, museum (CSW) received consult for assistance with medication. CSW made RN Case Manager aware of above. CSW will continue to follow and assist as needed.   Blima Rich, [place] [phone]

## 2014-11-01 NOTE — Plan of Care (Signed)
Problem: Discharge Progression Outcomes Goal: Other Discharge Outcomes/Goals Plan of care progress to goal: Pain med given x1 for left foot pain VSS Activity: independent Dressing changed to left foot with gauze and paper tape

## 2014-11-02 LAB — CREATININE, SERUM
CREATININE: 0.59 mg/dL (ref 0.44–1.00)
GFR calc Af Amer: >60 mL/min (ref >60)

## 2014-11-02 LAB — GLUCOSE, CAPILLARY
Glucose-Capillary: 266 mg/dL — ABNORMAL HIGH (ref 65–99)
Glucose-Capillary: 325 mg/dL — ABNORMAL HIGH (ref 65–99)

## 2014-11-02 MED ORDER — PROPRANOLOL HCL 40 MG PO TABS: 20.0000 mg | ORAL_TABLET | Freq: Two times a day (BID) | ORAL | Status: DC

## 2014-11-02 MED ORDER — CLINDAMYCIN HCL 300 MG PO CAPS: 300.0000 mg | ORAL_CAPSULE | Freq: Three times a day (TID) | ORAL | Status: DC

## 2014-11-02 MED ORDER — NICOTINE 21 MG/24HR TD PT24: 21.0000 mg | MEDICATED_PATCH | Freq: Every day | TRANSDERMAL | Status: DC

## 2014-11-02 MED ORDER — STERILE WATER FOR INJECTION IJ SOLN
INTRAMUSCULAR | Status: AC
  Administered 2014-11-02: 06:00:00
  Filled 2014-11-02: qty 10

## 2014-11-02 MED ORDER — CLINDAMYCIN HCL 150 MG PO CAPS
300.0000 mg | ORAL_CAPSULE | Freq: Three times a day (TID) | ORAL | Status: DC
  Administered 2014-11-02: 08:00:00 300 mg via ORAL
  Filled 2014-11-02: qty 2

## 2014-11-02 NOTE — Care Management Note (Signed)
Case Management Note  Patient Details  Name: JELICIA NANTZ MRN: [id number] Date of Birth: Aug 24, 1955  Subjective/Objective:    Discussed discharge planning with Ms Petron. From list of home health providers she chose [facility] but [facility] does not accept her Insurance.  She reports being in payment arrears with [facility]. A referral has been made to [facility] for an  RN for wound care.                Action/Plan:   Expected Discharge Date:                  Expected Discharge Plan:     In-House Referral:     Discharge planning Services     Post Acute Care Choice:    Choice offered to:     DME Arranged:    DME Agency:     HH Arranged:    [place] Agency:     Status of Service:     Medicare Important Message Given:    Date Medicare IM Given:    Medicare IM give by:    Date Additional Medicare IM Given:    Additional Medicare Important Message give by:     If discussed at [place] of Stay Meetings, dates discussed:    Additional Comments:  Lorine Iannaccone A, RN 11/02/2014, 10:01 AM

## 2014-11-02 NOTE — Plan of Care (Signed)
Problem: Discharge Progression Outcomes Goal: Other Discharge Outcomes/Goals Outcome: Progressing Plan of Care Progress to Goal:  Pt c/o left foot pain.  D/ced home today w/home health on oral abx.  Strongly encouraged her to quit smoking - explained w/diabetes, the smoking will exacerbate the vascular issues and increase possibility of wounds.  Sd she's really going to try b/c she doesn't want lung ca again.  Going home w/nicoderm patch.  IV removed.  Charge nurse reviewed d/c paperwork and she went home w/family.  Also really encouraged her to use the off-loading shoe - she shouldn't be putting weight on the wound that was debrided yesterday.  Wound was redressed before she went home.

## 2014-11-02 NOTE — Progress Notes (Signed)
MD order received in [facility] to discharge pt home with home health today; Care Management previously established [place] services with [facility] per [facility] notes; verbally reviewed AVS instructions with pt, gave Rxs to pt; no further questions voiced at this time; pt discharged via wheelchair by auxillary to the visitor's entrance

## 2014-11-02 NOTE — Discharge Instructions (Signed)

## 2014-11-02 NOTE — Discharge Summary (Signed)
[facility] NAME: Marie [name]    MR#:  [id number]  DATE OF BIRTH:  [birth year]  DATE OF ADMISSION:  10/31/2014 ADMITTING PHYSICIAN: Harrie Foreman, MD  DATE OF DISCHARGE: 11/02/2014  PRIMARY CARE PHYSICIAN: MASOUD,JAVED, MD    ADMISSION DIAGNOSIS:  Cellulitis of left foot [L03.116] Foot ulceration, left, limited to breakdown of skin [L97.521]  DISCHARGE DIAGNOSIS:  Active Problems:   Osteomyelitis   SECONDARY DIAGNOSIS:   Past Medical History  Diagnosis Date  . Arthritis   . Hypertension   . PAD (peripheral artery disease)   . COPD (chronic obstructive pulmonary disease)   . Cancer     Lung CA  . CHF (congestive heart failure)   . Diabetes mellitus type 2 with neurological manifestations     Peripheral neuropathy and foot ulcers    HOSPITAL COURSE:   1. Diabetic foot ulcer with superficial infection. Patient was admitted with suspected osteomyelitis commented on x-ray. The x-ray was reviewed by Dr. Elvina Mattes podiatry, who does not believe that this is an osteomyelitis. The patient had a prior surgical operation. He debrided the left foot ulcer. The ulcer was all superficial and no tracking. He believe the patient be stable for discharge home on 11/02/2014 on oral clindamycin for treatment course. Patient did not have a fever while here and white count was normal. Skin showed no signs of cellulitis. 2. Type 2 diabetes with peripheral neuropathy- patient takes glipizide and Glucophage as outpatient. 3. Peripheral artery disease- follow-up as outpatient. 4. COPD and tobacco abuse- smoking cessation counseling done 3 minutes by me. 5. History of lung cancer status post resection follows with Dr. Faith Rogue as outpatient. 6. Essential hypertension- propranolol dose decreased to 20 mg twice a day  DISCHARGE CONDITIONS:   Satisfactory  CONSULTS OBTAINED:  Treatment Team:  Albertine Patricia, DPM  DRUG ALLERGIES:    Allergies  Allergen Reactions  . Ampicillin Anaphylaxis  . Penicillins Anaphylaxis and Shortness Of Breath  . Avelox [Moxifloxacin Hcl In Nacl] Other (See Comments)    Reaction: MUSCLE CRAMPS  . Vicodin [Hydrocodone-Acetaminophen] Nausea Only  . Etodolac Rash  . Neurontin [Gabapentin] Anxiety    DISCHARGE MEDICATIONS:   Current Discharge Medication List    START taking these medications   Details  clindamycin (CLEOCIN) 300 MG capsule Take 1 capsule (300 mg total) by mouth every 8 (eight) hours. Qty: 27 capsule, Refills: 0    nicotine (NICODERM CQ - DOSED IN MG/24 HOURS) 21 mg/24hr patch Place 1 patch (21 mg total) onto the skin daily. Qty: 28 patch, Refills: 0      CONTINUE these medications which have CHANGED   Details  propranolol (INDERAL) 40 MG tablet Take 0.5 tablets (20 mg total) by mouth 2 (two) times daily. Qty: 1 tablet, Refills: 0   Associated Diagnoses: Diarrhea      CONTINUE these medications which have NOT CHANGED   Details  ALPRAZolam (XANAX) 0.25 MG tablet Take 0.25 mg by mouth. 1 pill twice daily    budesonide-formoterol (SYMBICORT) 160-4.5 MCG/ACT inhaler Inhale 2 puffs into the lungs 2 (two) times daily.    cyclobenzaprine (FLEXERIL) 10 MG tablet Take 10 mg by mouth 3 (three) times daily.     diazepam (VALIUM) 5 MG tablet Take 5 mg by mouth daily.   Associated Diagnoses: Diarrhea    diphenhydrAMINE (BENADRYL) 25 MG tablet Take 25 mg by mouth every 6 (six) hours as needed.    DULoxetine (CYMBALTA) 60 MG  capsule Take 60 mg by mouth daily.    furosemide (LASIX) 40 MG tablet Take 40 mg by mouth daily.    gemfibrozil (LOPID) 600 MG tablet Take 600 mg by mouth 2 (two) times daily before a meal.    glipiZIDE (GLUCOTROL) 5 MG tablet Take 5 mg by mouth daily.   Associated Diagnoses: Diarrhea    ibuprofen (ADVIL,MOTRIN) 100 MG tablet Take 200 mg by mouth every 6 (six) hours as needed for fever.    metFORMIN (GLUCOPHAGE) 1000 MG tablet Take 1 tablet  by mouth daily.    promethazine (PHENERGAN) 25 MG tablet Take 25 mg by mouth every 6 (six) hours as needed for nausea or vomiting.    Pseudoeph-CPM-DM-APAP (TYLENOL COLD) 30-2-15-325 MG TABS Take 1 tablet by mouth every 6 (six) hours as needed.    Sennosides-Docusate Sodium (STOOL SOFTENER LAXATIVE PO) Take 1 capsule by mouth daily.    zolpidem (AMBIEN) 10 MG tablet Take 10 mg by mouth at bedtime as needed for sleep.    HYDROcodone-acetaminophen (NORCO/VICODIN) 5-325 MG per tablet Take 1-2 tablets by mouth every 4 (four) hours as needed for moderate pain. Qty: 40 tablet, Refills: 0   Associated Diagnoses: Malignant neoplasm of lower lobe of right lung    lidocaine (XYLOCAINE) 5 % ointment Apply 1 application topically as needed. Apply to back prn back pain    ondansetron (ZOFRAN) 4 MG tablet Take 1 tablet (4 mg total) by mouth daily as needed for nausea or vomiting. Qty: 20 tablet, Refills: 1      STOP taking these medications     buPROPion (WELLBUTRIN XL) 150 MG 24 hr tablet      SYMBICORT 80-4.5 MCG/ACT inhaler      fluconazole (DIFLUCAN) 100 MG tablet      insulin glargine (LANTUS) 100 UNIT/ML injection          DISCHARGE INSTRUCTIONS:   Follow-up with Dr. Elvina Mattes 1 week Follow-up with Dr. Cletis Athens 2 weeks  If you experience worsening of your admission symptoms, develop shortness of breath, life threatening emergency, suicidal or homicidal thoughts you must seek medical attention immediately by calling 911 or calling your MD immediately  if symptoms less severe.  You Must read complete instructions/literature along with all the possible adverse reactions/side effects for all the Medicines you take and that have been prescribed to you. Take any new Medicines after you have completely understood and accept all the possible adverse reactions/side effects.   Please note  You were cared for by a hospitalist during your hospital stay. If you have any questions about your  discharge medications or the care you received while you were in the hospital after you are discharged, you can call the unit and asked to speak with the hospitalist on call if the hospitalist that took care of you is not available. Once you are discharged, your primary care physician will handle any further medical issues. Please note that NO REFILLS for any discharge medications will be authorized once you are discharged, as it is imperative that you return to your primary care physician (or establish a relationship with a primary care physician if you do not have one) for your aftercare needs so that they can reassess your need for medications and monitor your lab values.    Today   CHIEF COMPLAINT:   Chief Complaint  Patient presents with  . Wound Check    HISTORY OF PRESENT ILLNESS:  Marie [name]  is a 59 y.o. female with a  known history of diabetic foot ulcer coming in with increasing pain and some drainage of the left foot. X-ray showed possible osteomyelitis and ER physician recommended admission.   VITAL SIGNS:  Blood pressure 106/59, pulse 73, temperature 97.9 F (36.6 C), temperature source Oral, resp. rate 20, height '5\' 5"'$  (1.651 m), weight 85.458 kg (188 lb 6.4 oz), SpO2 92 %.  I/O:    Intake/Output Summary (Last 24 hours) at 11/02/14 0805 Last data filed at 11/01/14 1715  Gross per 24 hour  Intake 943.33 ml  Output   1100 ml  Net -156.67 ml    PHYSICAL EXAMINATION:  GENERAL:  59 y.o.-year-old patient lying in the bed with no acute distress.  EYES: Pupils equal, round, reactive to light and accommodation. No scleral icterus. Extraocular muscles intact.  HEENT: Head atraumatic, normocephalic. Oropharynx and nasopharynx clear.  NECK:  Supple, no jugular venous distention. No thyroid enlargement, no tenderness.  LUNGS: Normal breath sounds bilaterally, no wheezing, rales,rhonchi or crepitation. No use of accessory muscles of respiration.  CARDIOVASCULAR: S1, S2  normal. No murmurs, rubs, or gallops.  ABDOMEN: Soft, non-tender, non-distended. Bowel sounds present. No organomegaly or mass.  EXTREMITIES: No pedal edema, cyanosis, or clubbing.  NEUROLOGIC: Cranial nerves II through XII are intact. Muscle strength 5/5 in all extremities. Sensation intact. Gait not checked.  PSYCHIATRIC: The patient is alert and oriented x 3.  SKIN: Left foot covered by dressing by Dr. Elvina Mattes.  DATA REVIEW:   CBC  Recent Labs Lab 10/31/14 2056  WBC 8.6  HGB 12.4  HCT 35.8  PLT 283    Chemistries   Recent Labs Lab 10/31/14 2056 11/02/14 0454  NA 136  --   K 3.3*  --   CL 101  --   CO2 24  --   GLUCOSE 289*  --   BUN 18  --   CREATININE 0.68 0.59  CALCIUM 9.8  --      Microbiology Results  Results for orders placed or performed during the hospital encounter of 10/31/14  Blood culture (routine x 2)     Status: None (Preliminary result)   Collection Time: 10/31/14  9:41 PM  Result Value Ref Range Status   Specimen Description BLOOD LEFT AC  Final   Special Requests BOTTLES DRAWN AEROBIC AND ANAEROBIC  Final   Culture NO GROWTH 2 DAYS  Final   Report Status PENDING  Incomplete  Blood culture (routine x 2)     Status: None (Preliminary result)   Collection Time: 10/31/14  9:41 PM  Result Value Ref Range Status   Specimen Description BLOOD RIGHT HAND  Final   Special Requests BOTTLES DRAWN AEROBIC AND ANAEROBIC  Final   Culture NO GROWTH 2 DAYS  Final   Report Status PENDING  Incomplete    RADIOLOGY:  Dg Foot Complete Left  10/31/2014   CLINICAL DATA:  Diabetic ulcer at the plantar surface of the third fourth and fifth toes.  EXAM: LEFT FOOT - COMPLETE 3+ VIEW  COMPARISON:  01/23/2014  FINDINGS: Radiopaque presumed antibiotic pellets no longer visualized at the base of the second toe proximal phalanx. The second toe demonstrates evidence of previous metatarsal head resection and is diffusely osteopenic, increased since previously. No new  radiopaque foreign body. Mild midfoot degenerative change. No fracture or dislocation. Soft tissue ulcer noted at the plantar surface of the foot at the level of the proximal phalanges.  IMPRESSION: Development of diffuse osteopenia of the second toe which is concerning for osteomyelitis  but may be seen with disuse.  Plantar soft tissue ulcer.   Electronically Signed   By: Conchita Paris M.D.   On: 10/31/2014 21:09    Management plans discussed with the patient, family and they are in agreement.  CODE STATUS:     Code Status Orders        Start     Ordered   11/01/14 0124  Full code   Continuous     11/01/14 0123      TOTAL TIME TAKING CARE OF THIS PATIENT: 35 minutes. Greater than 50% of the time spent in coordination of care and reviewing medications with patient.   Loletha Grayer M.D on 11/02/2014 at 8:05 AM  Between 7am to 6pm - Pager - [phone]  After 6pm go to [email or web address] - password [email or web address] [facility]  Office  [phone]  CC: Primary care physician; Cletis Athens, MD

## 2014-11-02 NOTE — Plan of Care (Signed)
Problem: Discharge Progression Outcomes Goal: Other Discharge Outcomes/Goals Outcome: Progressing Pain:  Pt continues to c/o left foot pain. PRN pain meds given as ordered with little relief noted.  Hemo:  VSS. Afebrile. Dressing changed to left foot. Iv abx continue.

## 2014-11-02 NOTE — Care Management Note (Signed)
Case Management Note  Patient Details  Name: EMMAJEAN RATLEDGE MRN: [id number] Date of Birth: 1956/02/19  Subjective/Objective:         Received a phone call from the [facility] on call nurse stating that they were short staffed with nurses and would not be able to see Ms Doiron until Wednesday 11/05/14. This Probation officer then spoke with Chastity at [facility], stated Ms Ventress's concerns about an unpaid bill, and Chastity stated that [facility] will accept Ms Fawaz for home health RN, wound care. A referral was faxed to [facility] requesting that they please initiate care starting on Monday 11/03/14.           Action/Plan:   Expected Discharge Date:                  Expected Discharge Plan:     In-House Referral:     Discharge planning Services     Post Acute Care Choice:    Choice offered to:     DME Arranged:    DME Agency:     HH Arranged:    [place] Agency:     Status of Service:     Medicare Important Message Given:    Date Medicare IM Given:    Medicare IM give by:    Date Additional Medicare IM Given:    Additional Medicare Important Message give by:     If discussed at [place] of Stay Meetings, dates discussed:    Additional Comments:  Brittish Bolinger A, RN 11/02/2014, 1:03 PM

## 2014-11-03 DIAGNOSIS — L97521 Non-pressure chronic ulcer of other part of left foot limited to breakdown of skin: Secondary | ICD-10-CM | POA: Diagnosis not present

## 2014-11-03 DIAGNOSIS — I10 Essential (primary) hypertension: Secondary | ICD-10-CM | POA: Diagnosis not present

## 2014-11-03 DIAGNOSIS — J449 Chronic obstructive pulmonary disease, unspecified: Secondary | ICD-10-CM | POA: Diagnosis not present

## 2014-11-03 DIAGNOSIS — L03316 Cellulitis of umbilicus: Secondary | ICD-10-CM | POA: Diagnosis not present

## 2014-11-03 DIAGNOSIS — E1140 Type 2 diabetes mellitus with diabetic neuropathy, unspecified: Secondary | ICD-10-CM | POA: Diagnosis not present

## 2014-11-03 DIAGNOSIS — E1149 Type 2 diabetes mellitus with other diabetic neurological complication: Secondary | ICD-10-CM | POA: Diagnosis not present

## 2014-11-03 DIAGNOSIS — I739 Peripheral vascular disease, unspecified: Secondary | ICD-10-CM | POA: Diagnosis not present

## 2014-11-03 DIAGNOSIS — M1990 Unspecified osteoarthritis, unspecified site: Secondary | ICD-10-CM | POA: Diagnosis not present

## 2014-11-03 DIAGNOSIS — E11621 Type 2 diabetes mellitus with foot ulcer: Secondary | ICD-10-CM | POA: Diagnosis not present

## 2014-11-03 DIAGNOSIS — Z85118 Personal history of other malignant neoplasm of bronchus and lung: Secondary | ICD-10-CM | POA: Diagnosis not present

## 2014-11-04 DIAGNOSIS — L03316 Cellulitis of umbilicus: Secondary | ICD-10-CM | POA: Diagnosis not present

## 2014-11-05 DIAGNOSIS — S91302A Unspecified open wound, left foot, initial encounter: Secondary | ICD-10-CM | POA: Diagnosis not present

## 2014-11-05 LAB — CULTURE, BLOOD (ROUTINE X 2)
Culture: NO GROWTH
Culture: NO GROWTH

## 2014-11-06 DIAGNOSIS — M1990 Unspecified osteoarthritis, unspecified site: Secondary | ICD-10-CM | POA: Diagnosis not present

## 2014-11-06 DIAGNOSIS — I10 Essential (primary) hypertension: Secondary | ICD-10-CM | POA: Diagnosis not present

## 2014-11-06 DIAGNOSIS — E1140 Type 2 diabetes mellitus with diabetic neuropathy, unspecified: Secondary | ICD-10-CM | POA: Diagnosis not present

## 2014-11-06 DIAGNOSIS — E11621 Type 2 diabetes mellitus with foot ulcer: Secondary | ICD-10-CM | POA: Diagnosis not present

## 2014-11-06 DIAGNOSIS — E1149 Type 2 diabetes mellitus with other diabetic neurological complication: Secondary | ICD-10-CM | POA: Diagnosis not present

## 2014-11-06 DIAGNOSIS — L97521 Non-pressure chronic ulcer of other part of left foot limited to breakdown of skin: Secondary | ICD-10-CM | POA: Diagnosis not present

## 2014-11-06 DIAGNOSIS — Z85118 Personal history of other malignant neoplasm of bronchus and lung: Secondary | ICD-10-CM | POA: Diagnosis not present

## 2014-11-06 DIAGNOSIS — L03316 Cellulitis of umbilicus: Secondary | ICD-10-CM | POA: Diagnosis not present

## 2014-11-06 DIAGNOSIS — J449 Chronic obstructive pulmonary disease, unspecified: Secondary | ICD-10-CM | POA: Diagnosis not present

## 2014-11-06 DIAGNOSIS — I739 Peripheral vascular disease, unspecified: Secondary | ICD-10-CM | POA: Diagnosis not present

## 2014-11-11 DIAGNOSIS — L03316 Cellulitis of umbilicus: Secondary | ICD-10-CM | POA: Diagnosis not present

## 2014-11-11 DIAGNOSIS — I739 Peripheral vascular disease, unspecified: Secondary | ICD-10-CM | POA: Diagnosis not present

## 2014-11-11 DIAGNOSIS — Z85118 Personal history of other malignant neoplasm of bronchus and lung: Secondary | ICD-10-CM | POA: Diagnosis not present

## 2014-11-11 DIAGNOSIS — I10 Essential (primary) hypertension: Secondary | ICD-10-CM | POA: Diagnosis not present

## 2014-11-11 DIAGNOSIS — M1990 Unspecified osteoarthritis, unspecified site: Secondary | ICD-10-CM | POA: Diagnosis not present

## 2014-11-11 DIAGNOSIS — J449 Chronic obstructive pulmonary disease, unspecified: Secondary | ICD-10-CM | POA: Diagnosis not present

## 2014-11-11 DIAGNOSIS — E1140 Type 2 diabetes mellitus with diabetic neuropathy, unspecified: Secondary | ICD-10-CM | POA: Diagnosis not present

## 2014-11-11 DIAGNOSIS — E1149 Type 2 diabetes mellitus with other diabetic neurological complication: Secondary | ICD-10-CM | POA: Diagnosis not present

## 2014-11-11 DIAGNOSIS — E11621 Type 2 diabetes mellitus with foot ulcer: Secondary | ICD-10-CM | POA: Diagnosis not present

## 2014-11-11 DIAGNOSIS — L97521 Non-pressure chronic ulcer of other part of left foot limited to breakdown of skin: Secondary | ICD-10-CM | POA: Diagnosis not present

## 2014-11-12 DIAGNOSIS — E1142 Type 2 diabetes mellitus with diabetic polyneuropathy: Secondary | ICD-10-CM | POA: Diagnosis not present

## 2014-11-12 DIAGNOSIS — M216X2 Other acquired deformities of left foot: Secondary | ICD-10-CM | POA: Diagnosis not present

## 2014-11-13 ENCOUNTER — Ambulatory Visit
Admission: RE | Admit: 2014-11-13 | Discharge: 2014-11-13 | Disposition: A | Discharge status: Home / Self Care | Payer: Medicare Other | Source: Ambulatory Visit | Attending: Cardiothoracic Surgery | Admitting: Cardiothoracic Surgery

## 2014-11-13 ENCOUNTER — Inpatient Hospital Stay: Payer: Medicare Other | Attending: Cardiothoracic Surgery | Admitting: Cardiothoracic Surgery

## 2014-11-13 ENCOUNTER — Telehealth: Payer: Self-pay

## 2014-11-13 VITALS — BP 135/81 | HR 71 | Temp 96.8°F | Resp 18 | Ht 65.0 in | Wt 191.8 lb

## 2014-11-13 DIAGNOSIS — C3431 Malignant neoplasm of lower lobe, right bronchus or lung: Secondary | ICD-10-CM

## 2014-11-13 DIAGNOSIS — J984 Other disorders of lung: Secondary | ICD-10-CM | POA: Diagnosis not present

## 2014-11-13 DIAGNOSIS — Z85118 Personal history of other malignant neoplasm of bronchus and lung: Secondary | ICD-10-CM | POA: Diagnosis not present

## 2014-11-13 DIAGNOSIS — Z08 Encounter for follow-up examination after completed treatment for malignant neoplasm: Secondary | ICD-10-CM | POA: Diagnosis not present

## 2014-11-13 NOTE — Progress Notes (Signed)
Marie [name] Inpatient Post-Op Note  Patient ID: Marie [name], female   DOB: 25-Apr-1955, 59 y.o.   MRN: [id number]  HISTORY: His patient presents today in follow-up. She is approximately 2-2-1/2 months out from her right thoracotomy and wedge resection of a right lower lobe carcinoma the lung. She comes in today with continued complaints of some postoperative discomfort. In addition she was recently admitted to the hospital for management of a diabetic foot ulcer on the left. She is currently being treated with clindamycin. She continues to smoke. In addition she stopped her subcutaneous insulin injections. Her blood sugars are ranging about 200. She is currently wearing a offloading shoe on the left.   Filed Vitals:   11/13/14 0831  BP: 135/81  Pulse: 71  Temp: 96.8 F (36 C)  Resp: 18     EXAM: Resp: Lungs are clear bilaterally.  No respiratory distress, normal effort. Heart:  Regular without murmurs Abd:  Abdomen is soft, non distended and non tender. No masses are palpable.  There is no rebound and no guarding.  Neurological: Alert and oriented to person, place, and time. Coordination normal.  Skin: Skin is warm and dry. No rash noted. No diaphoretic. No erythema. No pallor.  Right thoracotomy wound is well approximated without erythema or nodules.  Psychiatric: Normal mood and affect. Normal behavior. Judgment and thought content normal.    ASSESSMENT: I have independently reviewed the patient's chest x-ray from today. I see no pneumothorax or pleural effusion.   PLAN:   She should follow-up with Dr. Loni Muse it as per his routine. I did not make a return appointment for her but would be happy to see her should the need arise.    Nestor Lewandowsky, MD

## 2014-11-13 NOTE — Progress Notes (Signed)
Patient also requesting RF on antiemetics.  She states that she is nauseated due to being on clindamycin for several weeks. She had a necrotic toe on her right foot lanced recently by podiatry.  Today, we have requested that patient see primary care for routine medication refills.

## 2014-11-13 NOTE — Telephone Encounter (Signed)
Open in error

## 2014-11-18 DIAGNOSIS — E11621 Type 2 diabetes mellitus with foot ulcer: Secondary | ICD-10-CM | POA: Diagnosis not present

## 2014-11-18 DIAGNOSIS — L97521 Non-pressure chronic ulcer of other part of left foot limited to breakdown of skin: Secondary | ICD-10-CM | POA: Diagnosis not present

## 2014-11-18 DIAGNOSIS — E1140 Type 2 diabetes mellitus with diabetic neuropathy, unspecified: Secondary | ICD-10-CM | POA: Diagnosis not present

## 2014-11-18 DIAGNOSIS — J449 Chronic obstructive pulmonary disease, unspecified: Secondary | ICD-10-CM | POA: Diagnosis not present

## 2014-11-18 DIAGNOSIS — L03316 Cellulitis of umbilicus: Secondary | ICD-10-CM | POA: Diagnosis not present

## 2014-11-18 DIAGNOSIS — M1990 Unspecified osteoarthritis, unspecified site: Secondary | ICD-10-CM | POA: Diagnosis not present

## 2014-11-18 DIAGNOSIS — E1149 Type 2 diabetes mellitus with other diabetic neurological complication: Secondary | ICD-10-CM | POA: Diagnosis not present

## 2014-11-18 DIAGNOSIS — Z85118 Personal history of other malignant neoplasm of bronchus and lung: Secondary | ICD-10-CM | POA: Diagnosis not present

## 2014-11-18 DIAGNOSIS — I10 Essential (primary) hypertension: Secondary | ICD-10-CM | POA: Diagnosis not present

## 2014-11-18 DIAGNOSIS — I739 Peripheral vascular disease, unspecified: Secondary | ICD-10-CM | POA: Diagnosis not present

## 2014-11-19 DIAGNOSIS — M216X2 Other acquired deformities of left foot: Secondary | ICD-10-CM | POA: Diagnosis not present

## 2014-11-19 DIAGNOSIS — E1142 Type 2 diabetes mellitus with diabetic polyneuropathy: Secondary | ICD-10-CM | POA: Diagnosis not present

## 2014-12-17 DIAGNOSIS — C3431 Malignant neoplasm of lower lobe, right bronchus or lung: Secondary | ICD-10-CM | POA: Diagnosis not present

## 2014-12-17 DIAGNOSIS — E119 Type 2 diabetes mellitus without complications: Secondary | ICD-10-CM | POA: Diagnosis not present

## 2014-12-18 DIAGNOSIS — Z23 Encounter for immunization: Secondary | ICD-10-CM | POA: Diagnosis not present

## 2014-12-22 DIAGNOSIS — L97521 Non-pressure chronic ulcer of other part of left foot limited to breakdown of skin: Secondary | ICD-10-CM | POA: Diagnosis not present

## 2014-12-22 DIAGNOSIS — E1142 Type 2 diabetes mellitus with diabetic polyneuropathy: Secondary | ICD-10-CM | POA: Diagnosis not present

## 2014-12-25 DIAGNOSIS — G8929 Other chronic pain: Secondary | ICD-10-CM | POA: Diagnosis not present

## 2015-01-12 DIAGNOSIS — E1142 Type 2 diabetes mellitus with diabetic polyneuropathy: Secondary | ICD-10-CM | POA: Diagnosis not present

## 2015-01-12 DIAGNOSIS — L97521 Non-pressure chronic ulcer of other part of left foot limited to breakdown of skin: Secondary | ICD-10-CM | POA: Diagnosis not present

## 2015-01-12 DIAGNOSIS — M216X2 Other acquired deformities of left foot: Secondary | ICD-10-CM | POA: Diagnosis not present

## 2015-02-09 DIAGNOSIS — I739 Peripheral vascular disease, unspecified: Secondary | ICD-10-CM | POA: Diagnosis not present

## 2015-02-09 DIAGNOSIS — M2042 Other hammer toe(s) (acquired), left foot: Secondary | ICD-10-CM | POA: Diagnosis not present

## 2015-02-09 DIAGNOSIS — E1142 Type 2 diabetes mellitus with diabetic polyneuropathy: Secondary | ICD-10-CM | POA: Diagnosis not present

## 2015-02-09 DIAGNOSIS — L851 Acquired keratosis [keratoderma] palmaris et plantaris: Secondary | ICD-10-CM | POA: Diagnosis not present

## 2015-02-09 DIAGNOSIS — M216X2 Other acquired deformities of left foot: Secondary | ICD-10-CM | POA: Diagnosis not present

## 2015-03-11 DIAGNOSIS — M2042 Other hammer toe(s) (acquired), left foot: Secondary | ICD-10-CM | POA: Diagnosis not present

## 2015-03-11 DIAGNOSIS — M216X2 Other acquired deformities of left foot: Secondary | ICD-10-CM | POA: Diagnosis not present

## 2015-03-11 DIAGNOSIS — L97521 Non-pressure chronic ulcer of other part of left foot limited to breakdown of skin: Secondary | ICD-10-CM | POA: Diagnosis not present

## 2015-03-11 DIAGNOSIS — E1142 Type 2 diabetes mellitus with diabetic polyneuropathy: Secondary | ICD-10-CM | POA: Diagnosis not present

## 2015-03-12 ENCOUNTER — Emergency Department: Payer: Medicare Other

## 2015-03-12 ENCOUNTER — Encounter: Payer: Self-pay | Admitting: Emergency Medicine

## 2015-03-12 ENCOUNTER — Other Ambulatory Visit: Payer: Self-pay

## 2015-03-12 ENCOUNTER — Emergency Department
Admission: EM | Admit: 2015-03-12 | Discharge: 2015-03-12 | Disposition: A | Discharge status: Home / Self Care | Payer: Medicare Other | Attending: Emergency Medicine | Admitting: Emergency Medicine

## 2015-03-12 DIAGNOSIS — Z79899 Other long term (current) drug therapy: Secondary | ICD-10-CM | POA: Diagnosis not present

## 2015-03-12 DIAGNOSIS — Z87891 Personal history of nicotine dependence: Secondary | ICD-10-CM | POA: Diagnosis not present

## 2015-03-12 DIAGNOSIS — R300 Dysuria: Secondary | ICD-10-CM | POA: Insufficient documentation

## 2015-03-12 DIAGNOSIS — J441 Chronic obstructive pulmonary disease with (acute) exacerbation: Secondary | ICD-10-CM | POA: Insufficient documentation

## 2015-03-12 DIAGNOSIS — R0602 Shortness of breath: Secondary | ICD-10-CM | POA: Diagnosis not present

## 2015-03-12 DIAGNOSIS — E1149 Type 2 diabetes mellitus with other diabetic neurological complication: Secondary | ICD-10-CM | POA: Insufficient documentation

## 2015-03-12 DIAGNOSIS — I10 Essential (primary) hypertension: Secondary | ICD-10-CM | POA: Insufficient documentation

## 2015-03-12 DIAGNOSIS — Z880 Allergy status to penicillin: Secondary | ICD-10-CM | POA: Diagnosis not present

## 2015-03-12 DIAGNOSIS — Z7951 Long term (current) use of inhaled steroids: Secondary | ICD-10-CM | POA: Insufficient documentation

## 2015-03-12 LAB — BASIC METABOLIC PANEL
ANION GAP: 9 (ref 5–15)
BUN: 15 mg/dL (ref 6–20)
CALCIUM: 9 mg/dL (ref 8.9–10.3)
CHLORIDE: 102 mmol/L (ref 101–111)
CO2: 22 mmol/L (ref 22–32)
Creatinine, Ser: 0.53 mg/dL (ref 0.44–1.00)
GFR calc non Af Amer: >60 mL/min (ref >60)
GLUCOSE: 301 mg/dL — AB (ref 65–99)
POTASSIUM: 3.8 mmol/L (ref 3.5–5.1)
Sodium: 133 mmol/L — ABNORMAL LOW (ref 135–145)

## 2015-03-12 LAB — CBC
HEMATOCRIT: 39.9 % (ref 35.0–47.0)
HEMOGLOBIN: 13.4 g/dL (ref 12.0–16.0)
MCH: 30.4 pg (ref 26.0–34.0)
MCHC: 33.7 g/dL (ref 32.0–36.0)
MCV: 90.3 fL (ref 80.0–100.0)
Platelets: 233 10*3/uL (ref 150–440)
RBC: 4.41 MIL/uL (ref 3.80–5.20)
RDW: 13.4 % (ref 11.5–14.5)
WBC: 7.5 10*3/uL (ref 3.6–11.0)

## 2015-03-12 LAB — URINALYSIS COMPLETE WITH MICROSCOPIC (ARMC ONLY)
BILIRUBIN URINE: NEGATIVE
Leukocytes, UA: NEGATIVE
Nitrite: NEGATIVE
Protein, ur: 30 mg/dL — AB
Specific Gravity, Urine: 1.01 (ref 1.005–1.030)
pH: 5 (ref 5.0–8.0)

## 2015-03-12 MED ORDER — IPRATROPIUM-ALBUTEROL 0.5-2.5 (3) MG/3ML IN SOLN
3.0000 mL | Freq: Once | RESPIRATORY_TRACT | Status: AC
  Administered 2015-03-12: 3 mL via RESPIRATORY_TRACT
  Filled 2015-03-12: qty 3

## 2015-03-12 MED ORDER — AZITHROMYCIN 250 MG PO TABS: ORAL_TABLET | ORAL | Status: AC

## 2015-03-12 MED ORDER — ALBUTEROL SULFATE (2.5 MG/3ML) 0.083% IN NEBU: 5.0000 mg | INHALATION_SOLUTION | Freq: Four times a day (QID) | RESPIRATORY_TRACT | Status: DC | PRN

## 2015-03-12 MED ORDER — PREDNISONE 20 MG PO TABS: 40.0000 mg | ORAL_TABLET | Freq: Every day | ORAL | Status: DC

## 2015-03-12 MED ORDER — ACETAMINOPHEN 325 MG PO TABS
650.0000 mg | ORAL_TABLET | Freq: Once | ORAL | Status: AC
  Administered 2015-03-12: 650 mg via ORAL
  Filled 2015-03-12: qty 2

## 2015-03-12 MED ORDER — ALBUTEROL SULFATE HFA 108 (90 BASE) MCG/ACT IN AERS: 2.0000 | INHALATION_SPRAY | Freq: Four times a day (QID) | RESPIRATORY_TRACT | Status: DC | PRN

## 2015-03-12 MED ORDER — PREDNISONE 20 MG PO TABS
60.0000 mg | ORAL_TABLET | Freq: Once | ORAL | Status: AC
  Administered 2015-03-12: 60 mg via ORAL
  Filled 2015-03-12: qty 3

## 2015-03-12 MED ORDER — IPRATROPIUM-ALBUTEROL 0.5-2.5 (3) MG/3ML IN SOLN
RESPIRATORY_TRACT | Status: AC
  Administered 2015-03-12: 3 mL via RESPIRATORY_TRACT
  Filled 2015-03-12: qty 3

## 2015-03-12 NOTE — ED Notes (Signed)
Patient presents to the ED with cough, congestion and shortness of breath x 1 week that is not improving.  Patient reports history of lung ca and surgery to remove lung ca in her right lung in May of 2016.  Patient reports area of surgery has been very painful with coughing.  Patient reports being told the surgery removed all the cancer and is not scheduled for another appointment with her oncologist for several months.  Patient is alert and oriented, no obvious distress at this time.

## 2015-03-12 NOTE — ED Provider Notes (Signed)
[facility] Emergency Department Provider Note    ____________________________________________  Time seen: 1000  I have reviewed the triage vital signs and the nursing notes.   HISTORY  Chief Complaint Shortness of Breath   History limited by: Not Limited   HPI Marie [name] is a 59 y.o. female with history of COPD, lung cancer status post resection, presents to the emergency department for roughly 1 week history of shortness breath and cough. The patient states that the symptoms started roughly 1 week ago and progressively gotten worse. She has had a cough productive of some green phlegm. She states that since the cough started she has not developed pain which is worse with a cough. She describes it as being located below the bilateral breasts. She has also had fever up to 102 measured at home. She denies any nausea or vomiting or diarrhea. She has had some mild discomfort with urination.  Past Medical History  Diagnosis Date  . Arthritis   . Hypertension   . PAD (peripheral artery disease) ([place])   . COPD (chronic obstructive pulmonary disease) ([place])   . Cancer ([place])     Lung CA  . CHF (congestive heart failure) ([place])   . Diabetes mellitus type 2 with neurological manifestations ([place])     Peripheral neuropathy and foot ulcers    Patient Active Problem List   Diagnosis Date Noted  . Osteomyelitis ([place]) 11/01/2014  . Malignant neoplasm of lower lobe of right lung ([place]) 10/02/2014    Past Surgical History  Procedure Laterality Date  . Abdominal hysterectomy    . Cesarean section      x3  . Foot surgery Left     diabetic ulcer  . Carpal tunnel release Bilateral   . Knee arthroscopy Bilateral   . Tonsillectomy    . Back surgery  x 7  . Video assisted thoracoscopy (vats)/thorocotomy Right 08/26/2014    Procedure: VIDEO ASSISTED THORACOSCOPY (VATS)/THOROCOTOMY;  Surgeon: Nestor Lewandowsky, MD;  Location: [facility] ORS;  Service: General;  Laterality:  Right;    Current Outpatient Rx  Name  Route  Sig  Dispense  Refill  . ALPRAZolam (XANAX) 0.25 MG tablet   Oral   Take 0.25 mg by mouth. 1 pill twice daily         . budesonide-formoterol (SYMBICORT) 160-4.5 MCG/ACT inhaler   Inhalation   Inhale 2 puffs into the lungs 2 (two) times daily.         . cyclobenzaprine (FLEXERIL) 10 MG tablet   Oral   Take 10 mg by mouth 3 (three) times daily.          . diazepam (VALIUM) 5 MG tablet   Oral   Take 5 mg by mouth daily.         . diphenhydrAMINE (BENADRYL) 25 MG tablet   Oral   Take 25 mg by mouth every 6 (six) hours as needed.         . DULoxetine (CYMBALTA) 60 MG capsule   Oral   Take 60 mg by mouth daily.         . furosemide (LASIX) 40 MG tablet   Oral   Take 40 mg by mouth daily.         Marland Kitchen gemfibrozil (LOPID) 600 MG tablet   Oral   Take 600 mg by mouth 2 (two) times daily before a meal.         . glipiZIDE (GLUCOTROL) 5 MG tablet   Oral  Take 5 mg by mouth daily.         Marland Kitchen ibuprofen (ADVIL,MOTRIN) 100 MG tablet   Oral   Take 200 mg by mouth every 6 (six) hours as needed for fever.         . lidocaine (XYLOCAINE) 5 % ointment   Topical   Apply 1 application topically as needed. Apply to back prn back pain         . nicotine (NICODERM CQ - DOSED IN MG/24 HOURS) 21 mg/24hr patch   Transdermal   Place 1 patch (21 mg total) onto the skin daily.   28 patch   0   . ondansetron (ZOFRAN) 4 MG tablet   Oral   Take 1 tablet (4 mg total) by mouth daily as needed for nausea or vomiting.   20 tablet   1   . promethazine (PHENERGAN) 25 MG tablet   Oral   Take 25 mg by mouth every 6 (six) hours as needed for nausea or vomiting.         . propranolol (INDERAL) 40 MG tablet   Oral   Take 0.5 tablets (20 mg total) by mouth 2 (two) times daily.   1 tablet   0   . Pseudoeph-CPM-DM-APAP (TYLENOL COLD) 30-2-15-325 MG TABS   Oral   Take 1 tablet by mouth every 6 (six) hours as needed.          Orlie Dakin Sodium (STOOL SOFTENER LAXATIVE PO)   Oral   Take 1 capsule by mouth daily.         Marland Kitchen zolpidem (AMBIEN) 10 MG tablet   Oral   Take 10 mg by mouth at bedtime as needed for sleep.           Allergies Ampicillin; Penicillins; Avelox; Metformin; Etodolac; and Neurontin  Family History  Problem Relation Age of Onset  . Hypertension Brother   . Coronary artery disease Mother   . Coronary artery disease Father   . Diabetes Mellitus II Brother   . Diabetes Mellitus II Father   . Diabetes Mellitus II Mother     Social History Social History  Substance Use Topics  . Smoking status: Former Smoker -- 0.50 packs/day for 46 years    Types: Cigarettes  . Smokeless tobacco: Never Used     Comment: stopped smoking 08/25/14  . Alcohol Use: No    Review of Systems  Constitutional: Negative for fever. Cardiovascular: Positive for chest pain. Respiratory: Positive for shortness of breath. Gastrointestinal: Negative for abdominal pain, vomiting and diarrhea. Genitourinary: Positive for dysuria. Musculoskeletal: Negative for back pain. Skin: Negative for rash. Neurological: Negative for headaches, focal weakness or numbness.   10-point ROS otherwise negative.  ____________________________________________   PHYSICAL EXAM:  VITAL SIGNS: ED Triage Vitals  Enc Vitals Group     BP 03/12/15 0822 170/96 mmHg     Pulse Rate 03/12/15 0822 100     Resp 03/12/15 0822 22     Temp 03/12/15 0822 98.3 F (36.8 C)     Temp Source 03/12/15 0822 Oral     SpO2 03/12/15 0822 95 %     Weight 03/12/15 0822 200 lb (90.719 kg)     Height 03/12/15 0822 '5\' 5"'$  (1.651 m)     Head Cir --      Peak Flow --      Pain Score 03/12/15 0822 8   Constitutional: Alert and oriented. Well appearing and in no distress. Eyes: Conjunctivae are normal. PERRL.  Normal extraocular movements. ENT   Head: Normocephalic and atraumatic.   Nose: No congestion/rhinnorhea.    Mouth/Throat: Mucous membranes are moist.   Neck: No stridor. Hematological/Lymphatic/Immunilogical: No cervical lymphadenopathy. Cardiovascular: Normal rate, regular rhythm.  No murmurs, rubs, or gallops. Respiratory: Normal respiratory effort without tachypnea nor retractions. Bilateral diffuse expiratory wheezing. Gastrointestinal: Soft and nontender. No distention. Genitourinary: Deferred Musculoskeletal: Normal range of motion in all extremities. No joint effusions.  No lower extremity tenderness nor edema. Neurologic:  Normal speech and language. No gross focal neurologic deficits are appreciated.  Skin:  Skin is warm, dry and intact. No rash noted. Psychiatric: Mood and affect are normal. Speech and behavior are normal. Patient exhibits appropriate insight and judgment.  ____________________________________________    LABS (pertinent positives/negatives)  Labs Reviewed  BASIC METABOLIC PANEL - Abnormal; Notable for the following:    Sodium 133 (*)    Glucose, Bld 301 (*)    All other components within normal limits  CBC     ____________________________________________   EKG  I, Nance Pear, attending physician, personally viewed and interpreted this EKG  EKG Time: 0832 Rate: 89 Rhythm: NSR Axis: normal Intervals: qtc 479 QRS: narrow ST changes: no st elevation Impression: normal ekg ____________________________________________    RADIOLOGY  CXR IMPRESSION: No acute chest findings.   ____________________________________________   PROCEDURES  Procedure(s) performed: None  Critical Care performed: No  ____________________________________________   INITIAL IMPRESSION / ASSESSMENT AND PLAN / ED COURSE  Pertinent labs & imaging results that were available during my care of the patient were reviewed by me and considered in my medical decision making (see chart for details).  Patient presented to the emergency department today because of  concerns for shortness of breath and cough. Patient lungs did exhibit wheezing initially. Patient was given steroids as well as DuoNeb treatments. Patient states she did feel improvement after the medications. Will discharge home with COPD medications.  ____________________________________________   FINAL CLINICAL IMPRESSION(S) / ED DIAGNOSES  Final diagnoses:  COPD exacerbation ([place])     Nance Pear, MD 03/12/15 1255

## 2015-03-12 NOTE — Discharge Instructions (Signed)
Please seek medical attention for any high fevers, chest pain, shortness of breath, change in behavior, persistent vomiting, bloody stool or any other new or concerning symptoms.   Chronic Obstructive Pulmonary Disease Exacerbation Chronic obstructive pulmonary disease (COPD) is a common lung problem. In COPD, the flow of air from the lungs is limited. COPD exacerbations are times that breathing gets worse and you need extra treatment. Without treatment they can be life threatening. If they happen often, your lungs can become more damaged. If your COPD gets worse, your doctor may treat you with:  Medicines.  Oxygen.  Different ways to clear your airway, such as using a mask. HOME CARE  Do not smoke.  Avoid tobacco smoke and other things that bother your lungs.  If given, take your antibiotic medicine as told. Finish the medicine even if you start to feel better.  Only take medicines as told by your doctor.  Drink enough fluids to keep your pee (urine) clear or pale yellow (unless your doctor has told you not to).  Use a cool mist machine (vaporizer).  If you use oxygen or a machine that turns liquid medicine into a mist (nebulizer), continue to use them as told.  Keep up with shots (vaccinations) as told by your doctor.  Exercise regularly.  Eat healthy foods.  Keep all doctor visits as told. GET HELP RIGHT AWAY IF:  You are very short of breath and it gets worse.  You have trouble talking.  You have bad chest pain.  You have blood in your spit (sputum).  You have a fever.  You keep throwing up (vomiting).  You feel weak, or you pass out (faint).  You feel confused.  You keep getting worse. MAKE SURE YOU:  Understand these instructions.  Will watch your condition.  Will get help right away if you are not doing well or get worse.   This information is not intended to replace advice given to you by your health care provider. Make sure you discuss any  questions you have with your health care provider.   Document Released: 03/17/2011 Document Revised: 04/18/2014 Document Reviewed: 11/30/2012 Elsevier Interactive Patient Education [employer].

## 2015-03-18 DIAGNOSIS — E784 Other hyperlipidemia: Secondary | ICD-10-CM | POA: Diagnosis not present

## 2015-03-18 DIAGNOSIS — E119 Type 2 diabetes mellitus without complications: Secondary | ICD-10-CM | POA: Diagnosis not present

## 2015-03-18 DIAGNOSIS — I10 Essential (primary) hypertension: Secondary | ICD-10-CM | POA: Diagnosis not present

## 2015-03-18 DIAGNOSIS — Z0000 Encounter for general adult medical examination without abnormal findings: Secondary | ICD-10-CM | POA: Diagnosis not present

## 2015-03-18 DIAGNOSIS — R5381 Other malaise: Secondary | ICD-10-CM | POA: Diagnosis not present

## 2015-03-27 DIAGNOSIS — E784 Other hyperlipidemia: Secondary | ICD-10-CM | POA: Diagnosis not present

## 2015-03-27 DIAGNOSIS — E8881 Metabolic syndrome: Secondary | ICD-10-CM | POA: Diagnosis not present

## 2015-03-27 DIAGNOSIS — E119 Type 2 diabetes mellitus without complications: Secondary | ICD-10-CM | POA: Diagnosis not present

## 2015-03-27 DIAGNOSIS — E1029 Type 1 diabetes mellitus with other diabetic kidney complication: Secondary | ICD-10-CM | POA: Diagnosis not present

## 2015-04-14 DIAGNOSIS — E1142 Type 2 diabetes mellitus with diabetic polyneuropathy: Secondary | ICD-10-CM | POA: Diagnosis not present

## 2015-04-14 DIAGNOSIS — L97521 Non-pressure chronic ulcer of other part of left foot limited to breakdown of skin: Secondary | ICD-10-CM | POA: Diagnosis not present

## 2015-05-18 ENCOUNTER — Inpatient Hospital Stay: Payer: Medicare Other | Attending: Internal Medicine | Admitting: Internal Medicine

## 2015-05-18 ENCOUNTER — Inpatient Hospital Stay: Payer: Medicare Other

## 2015-05-18 VITALS — BP 160/84 | HR 105 | Temp 95.4°F | Resp 18 | Ht 65.0 in | Wt 200.0 lb

## 2015-05-18 DIAGNOSIS — Z79899 Other long term (current) drug therapy: Secondary | ICD-10-CM | POA: Diagnosis not present

## 2015-05-18 DIAGNOSIS — I10 Essential (primary) hypertension: Secondary | ICD-10-CM

## 2015-05-18 DIAGNOSIS — M1990 Unspecified osteoarthritis, unspecified site: Secondary | ICD-10-CM | POA: Insufficient documentation

## 2015-05-18 DIAGNOSIS — M79672 Pain in left foot: Secondary | ICD-10-CM | POA: Diagnosis not present

## 2015-05-18 DIAGNOSIS — J449 Chronic obstructive pulmonary disease, unspecified: Secondary | ICD-10-CM | POA: Diagnosis not present

## 2015-05-18 DIAGNOSIS — C3431 Malignant neoplasm of lower lobe, right bronchus or lung: Secondary | ICD-10-CM

## 2015-05-18 DIAGNOSIS — M216X2 Other acquired deformities of left foot: Secondary | ICD-10-CM | POA: Diagnosis not present

## 2015-05-18 DIAGNOSIS — Z23 Encounter for immunization: Secondary | ICD-10-CM | POA: Insufficient documentation

## 2015-05-18 DIAGNOSIS — F17210 Nicotine dependence, cigarettes, uncomplicated: Secondary | ICD-10-CM | POA: Diagnosis not present

## 2015-05-18 DIAGNOSIS — E1165 Type 2 diabetes mellitus with hyperglycemia: Secondary | ICD-10-CM | POA: Diagnosis not present

## 2015-05-18 DIAGNOSIS — L97521 Non-pressure chronic ulcer of other part of left foot limited to breakdown of skin: Secondary | ICD-10-CM | POA: Diagnosis not present

## 2015-05-18 DIAGNOSIS — G8922 Chronic post-thoracotomy pain: Secondary | ICD-10-CM | POA: Diagnosis not present

## 2015-05-18 DIAGNOSIS — E1142 Type 2 diabetes mellitus with diabetic polyneuropathy: Secondary | ICD-10-CM | POA: Insufficient documentation

## 2015-05-18 DIAGNOSIS — Z794 Long term (current) use of insulin: Secondary | ICD-10-CM | POA: Insufficient documentation

## 2015-05-18 DIAGNOSIS — I739 Peripheral vascular disease, unspecified: Secondary | ICD-10-CM | POA: Diagnosis not present

## 2015-05-18 DIAGNOSIS — M2042 Other hammer toe(s) (acquired), left foot: Secondary | ICD-10-CM | POA: Diagnosis not present

## 2015-05-18 LAB — CBC WITH DIFFERENTIAL/PLATELET
BASOS ABS: 0.1 10*3/uL (ref 0–0.1)
Basophils Relative: 1 %
EOS ABS: 0.3 10*3/uL (ref 0–0.7)
EOS PCT: 3 %
HCT: 41.7 % (ref 35.0–47.0)
Hemoglobin: 14.5 g/dL (ref 12.0–16.0)
Lymphocytes Relative: 36 %
Lymphs Abs: 3.5 10*3/uL (ref 1.0–3.6)
MCH: 31.5 pg (ref 26.0–34.0)
MCHC: 34.8 g/dL (ref 32.0–36.0)
MCV: 90.4 fL (ref 80.0–100.0)
Monocytes Absolute: 0.5 10*3/uL (ref 0.2–0.9)
Monocytes Relative: 5 %
Neutro Abs: 5.3 10*3/uL (ref 1.4–6.5)
Neutrophils Relative %: 55 %
PLATELETS: 262 10*3/uL (ref 150–440)
RBC: 4.61 MIL/uL (ref 3.80–5.20)
RDW: 13.6 % (ref 11.5–14.5)
WBC: 9.7 10*3/uL (ref 3.6–11.0)

## 2015-05-18 LAB — COMPREHENSIVE METABOLIC PANEL
ALT: 26 U/L (ref 14–54)
AST: 35 U/L (ref 15–41)
Albumin: 4.2 g/dL (ref 3.5–5.0)
Alkaline Phosphatase: 172 U/L — ABNORMAL HIGH (ref 38–126)
Anion gap: 8 (ref 5–15)
BUN: 21 mg/dL — ABNORMAL HIGH (ref 6–20)
CHLORIDE: 101 mmol/L (ref 101–111)
CO2: 22 mmol/L (ref 22–32)
CREATININE: 0.58 mg/dL (ref 0.44–1.00)
Calcium: 9.4 mg/dL (ref 8.9–10.3)
GFR calc non Af Amer: >60 mL/min (ref >60)
Glucose, Bld: 230 mg/dL — ABNORMAL HIGH (ref 65–99)
POTASSIUM: 3.7 mmol/L (ref 3.5–5.1)
SODIUM: 131 mmol/L — AB (ref 135–145)
Total Bilirubin: 0.5 mg/dL (ref 0.3–1.2)
Total Protein: 7.9 g/dL (ref 6.5–8.1)

## 2015-05-18 MED ORDER — INFLUENZA VAC SPLIT QUAD 0.5 ML IM SUSY
0.5000 mL | PREFILLED_SYRINGE | Freq: Once | INTRAMUSCULAR | Status: AC
  Administered 2015-05-18: 0.5 mL via INTRAMUSCULAR

## 2015-05-18 NOTE — Progress Notes (Signed)
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 I reviewed the patient's prior charts/pertinent labs/imaging in detail; findings are summarized above.    60 year old female patient with above history of stage I lung cancer currently is here for follow-up.  Patient unfortunately continues to smoke. She complains of chronic shortness of breath;  Chronic cough. No hemoptysis.  She has chronic pain in her right chest wall area at the site of surgery.   Denies any weight loss.  Recently she had foot infection for which she underwent surgery. She is also anticipating one more foot surgeries.  Has chronic tingling and numbness in her extremities.  REVIEW OF SYSTEMS:  A complete 10 point review of system is done which is negative except mentioned above/history of present illness.   PAST MEDICAL HISTORY :  Past Medical History  Diagnosis Date  . Arthritis   . Hypertension   . PAD (peripheral artery disease) ([place])   . COPD (chronic obstructive pulmonary disease) ([place])   . Cancer ([place])     Lung CA  . CHF (congestive heart failure) ([place])   . Diabetes mellitus type 2 with neurological manifestations ([place])     Peripheral neuropathy and foot ulcers    PAST SURGICAL HISTORY :   Past Surgical History  Procedure Laterality Date  . Abdominal hysterectomy    . Cesarean section      x3  . Foot surgery Left     diabetic ulcer  . Carpal tunnel release Bilateral   .  Knee arthroscopy Bilateral   . Tonsillectomy    . Back surgery  x 7  . Video assisted thoracoscopy (vats)/thorocotomy Right 08/26/2014    Procedure: VIDEO ASSISTED THORACOSCOPY (VATS)/THOROCOTOMY;  Surgeon: Nestor Lewandowsky, MD;  Location: [facility] ORS;  Service: General;  Laterality: Right;    FAMILY HISTORY :   Family History  Problem Relation Age of Onset  . Hypertension Brother   . Coronary artery disease Mother   . Coronary artery disease Father   . Diabetes Mellitus II Brother   . Diabetes Mellitus II Father   . Diabetes Mellitus II Mother     SOCIAL HISTORY:   Social History  Substance Use Topics  . Smoking status: Former Smoker -- 0.50 packs/day for 46 years    Types: Cigarettes  . Smokeless tobacco: Never Used     Comment: stopped smoking 08/25/14  . Alcohol Use: No    ALLERGIES:  is allergic to ampicillin; penicillins; avelox; metformin; etodolac; and neurontin.  MEDICATIONS:  Current Outpatient Prescriptions  Medication Sig Dispense Refill  . cyclobenzaprine (FLEXERIL) 10 MG tablet Take 10 mg by mouth 3 (three) times daily.     . diazepam (VALIUM) 5 MG tablet Take 5 mg by mouth daily.    . DULoxetine (CYMBALTA) 60 MG capsule Take 60 mg by mouth daily.    Marland Kitchen estradiol (ESTRACE) 1 MG tablet Take 1 mg by mouth daily.    . furosemide (LASIX) 40 MG tablet Take 40 mg by mouth daily.    Marland Kitchen  glipiZIDE (GLUCOTROL) 5 MG tablet Take 5 mg by mouth daily.    . Insulin Glargine (TOUJEO SOLOSTAR) 300 UNIT/ML SOPN Inject 50 Units into the skin as directed.    . propranolol (INDERAL) 40 MG tablet Take 40 mg by mouth 2 (two) times daily.    Marland Kitchen zolpidem (AMBIEN) 10 MG tablet Take 10 mg by mouth at bedtime as needed for sleep.    . budesonide-formoterol (SYMBICORT) 160-4.5 MCG/ACT inhaler Inhale 2 puffs into the lungs 2 (two) times daily.     No current facility-administered medications for this visit.    PHYSICAL EXAMINATION: ECOG PERFORMANCE STATUS: 0 - Asymptomatic  BP 160/84 mmHg   Pulse 105  Temp(Src) 95.4 F (35.2 C) (Tympanic)  Resp 18  Ht '5\' 5"'$  (1.651 m)  Wt 199 lb 15.3 oz (90.7 kg)  BMI 33.27 kg/m2  Filed Weights   05/18/15 0943  Weight: 199 lb 15.3 oz (90.7 kg)    GENERAL: Well-nourished well-developed; Alert, no distress and comfortable.   Alone.  EYES: no pallor or icterus OROPHARYNX: no thrush or ulceration  NECK: supple, no masses felt LYMPH:  no palpable lymphadenopathy in the cervical, axillary or inguinal regions LUNGS: clear to auscultation and  No wheeze or crackles HEART/CVS: regular rate & rhythm and no murmurs; No lower extremity edema ABDOMEN:abdomen soft, non-tender and normal bowel sounds Musculoskeletal:no cyanosis of digits and no clubbing  PSYCH: alert & oriented x 3 with fluent speech NEURO: no focal motor/sensory deficits SKIN:  no rashes or significant lesions  LABORATORY DATA:  I have reviewed the data as listed    Component Value Date/Time   NA 133* 03/12/2015 0836   NA 134* 07/24/2014 1215   K 3.8 03/12/2015 0836   K 4.2 07/24/2014 1215   CL 102 03/12/2015 0836   CL 100* 07/24/2014 1215   CO2 22 03/12/2015 0836   CO2 25 07/24/2014 1215   GLUCOSE 301* 03/12/2015 0836   GLUCOSE 213* 07/24/2014 1215   BUN 15 03/12/2015 0836   BUN 26* 07/24/2014 1215   CREATININE 0.53 03/12/2015 0836   CREATININE 0.71 07/24/2014 1215   CALCIUM 9.0 03/12/2015 0836   CALCIUM 9.8 07/24/2014 1215   PROT 8.7* 07/24/2014 1215   ALBUMIN 4.5 07/24/2014 1215   AST 39 07/24/2014 1215   ALT 37 07/24/2014 1215   ALKPHOS 159* 07/24/2014 1215   BILITOT 0.5 07/24/2014 1215   GFRNONAA >60 03/12/2015 0836   GFRNONAA >60 07/24/2014 1215   GFRNONAA >60 04/29/2014 0956   GFRAA >60 03/12/2015 0836   GFRAA >60 07/24/2014 1215   GFRAA >60 04/29/2014 0956    No results found for: SPEP, UPEP  Lab Results  Component Value Date   WBC 7.5 03/12/2015   NEUTROABS 4.4 10/31/2014   HGB 13.4 03/12/2015   HCT 39.9 03/12/2015   MCV 90.3 03/12/2015    PLT 233 03/12/2015      Chemistry      Component Value Date/Time   NA 133* 03/12/2015 0836   NA 134* 07/24/2014 1215   K 3.8 03/12/2015 0836   K 4.2 07/24/2014 1215   CL 102 03/12/2015 0836   CL 100* 07/24/2014 1215   CO2 22 03/12/2015 0836   CO2 25 07/24/2014 1215   BUN 15 03/12/2015 0836   BUN 26* 07/24/2014 1215   CREATININE 0.53 03/12/2015 0836   CREATININE 0.71 07/24/2014 1215      Component Value Date/Time   CALCIUM 9.0 03/12/2015 0836   CALCIUM 9.8 07/24/2014 1215  ALKPHOS 159* 07/24/2014 1215   AST 39 07/24/2014 1215   ALT 37 07/24/2014 1215   BILITOT 0.5 07/24/2014 1215       RADIOGRAPHIC STUDIES: I have personally reviewed the radiological images as listed and agreed with the findings in the report. No results found.   ASSESSMENT & PLAN:   # STAGE IB  Adenocarcinoma ofLUNG-  Status post wedge resection.  Clinically no evidence of recurrence.  However I would recommend a surveillance CT scan every 6 months.  We will order the scan today.  #  Postthoracotomy syndrome  #  Poorly controlled diabetes/  Smoking/ COPD-  Recommend talking to her PCP regarding  Refilling her medication.  # 25 minutes face-to-face with the patient discussing the above plan of care; more than 50% of time spent on prognosis/ natural history; counseling and coordination.      Cammie Sickle, MD 05/18/2015 10:10 AM

## 2015-05-21 ENCOUNTER — Ambulatory Visit: Admission: RE | Admit: 2015-05-21 | Payer: Medicare Other | Source: Ambulatory Visit

## 2015-05-21 ENCOUNTER — Encounter: Payer: Self-pay | Admitting: Anesthesiology

## 2015-05-25 NOTE — Discharge Instructions (Signed)
[facility] °[facility] ° °POST OPERATIVE INSTRUCTIONS FOR DR. TROXLER AND DR. FOWLER °[facility] ° ° °1. Take your medication as prescribed.  Pain medication should be taken only as needed. ° °2. Keep the dressing clean, dry and intact. ° °3. Keep your foot elevated above the heart level for the first 48 hours. ° °4. Walking to the bathroom and brief periods of walking are acceptable, unless we have instructed you to be non-weight bearing. ° °5. Always wear your post-op shoe when walking.  Always use your crutches if you are to be non-weight bearing. ° °6. Do not take a shower. Baths are permissible as long as the foot is kept out of the water.  ° °7. Every hour you are awake:  °- Bend your knee 15 times. °- Flex foot 15 times °- Massage calf 15 times ° °8. Call [facility] ([phone]) if any of the following problems occur: °- You develop a temperature or fever. °- The bandage becomes saturated with blood. °- Medication does not stop your pain. °- Injury of the foot occurs. °- Any symptoms of infection including redness, odor, or red streaks running from wound. ° °General Anesthesia, Adult, Care After °Refer to this sheet in the next few weeks. These instructions provide you with information on caring for yourself after your procedure. Your health care provider may also give you more specific instructions. Your treatment has been planned according to current medical practices, but problems sometimes occur. Call your health care provider if you have any problems or questions after your procedure. °WHAT TO EXPECT AFTER THE PROCEDURE °After the procedure, it is typical to experience: °· Sleepiness. °· Nausea and vomiting. °HOME CARE INSTRUCTIONS °· For the first 24 hours after general anesthesia: °¨ Have a responsible person with you. °¨ Do not drive a car. If you are alone, do not take public transportation. °¨ Do not drink alcohol. °¨ Do not take  medicine that has not been prescribed by your health care provider. °¨ Do not sign important papers or make important decisions. °¨ You may resume a normal diet and activities as directed by your health care provider. °· Change bandages (dressings) as directed. °· If you have questions or problems that seem related to general anesthesia, call the hospital and ask for the anesthetist or anesthesiologist on call. °SEEK MEDICAL CARE IF: °· You have nausea and vomiting that continue the day after anesthesia. °· You develop a rash. °SEEK IMMEDIATE MEDICAL CARE IF:  °· You have difficulty breathing. °· You have chest pain. °· You have any allergic problems. °  °This information is not intended to replace advice given to you by your health care provider. Make sure you discuss any questions you have with your health care provider. °  °Document Released: 07/04/2000 Document Revised: 04/18/2014 Document Reviewed: 07/27/2011 °Elsevier Interactive Patient Education ©2016 Elsevier Inc. ° °

## 2015-06-03 DIAGNOSIS — R5381 Other malaise: Secondary | ICD-10-CM | POA: Diagnosis not present

## 2015-06-03 DIAGNOSIS — L97521 Non-pressure chronic ulcer of other part of left foot limited to breakdown of skin: Secondary | ICD-10-CM | POA: Diagnosis not present

## 2015-06-03 DIAGNOSIS — M2042 Other hammer toe(s) (acquired), left foot: Secondary | ICD-10-CM | POA: Diagnosis not present

## 2015-06-03 DIAGNOSIS — E784 Other hyperlipidemia: Secondary | ICD-10-CM | POA: Diagnosis not present

## 2015-06-03 DIAGNOSIS — E119 Type 2 diabetes mellitus without complications: Secondary | ICD-10-CM | POA: Diagnosis not present

## 2015-06-03 DIAGNOSIS — M216X2 Other acquired deformities of left foot: Secondary | ICD-10-CM | POA: Diagnosis not present

## 2015-06-03 DIAGNOSIS — I10 Essential (primary) hypertension: Secondary | ICD-10-CM | POA: Diagnosis not present

## 2015-06-03 DIAGNOSIS — E1142 Type 2 diabetes mellitus with diabetic polyneuropathy: Secondary | ICD-10-CM | POA: Diagnosis not present

## 2015-06-05 ENCOUNTER — Other Ambulatory Visit: Payer: Self-pay | Admitting: Internal Medicine

## 2015-06-05 ENCOUNTER — Ambulatory Visit
Admission: RE | Admit: 2015-06-05 | Discharge: 2015-06-05 | Disposition: A | Discharge status: Home / Self Care | Payer: Medicare Other | Source: Ambulatory Visit | Attending: Cardiology | Admitting: Cardiology

## 2015-06-05 ENCOUNTER — Ambulatory Visit
Admission: RE | Admit: 2015-06-05 | Discharge: 2015-06-05 | Disposition: A | Discharge status: Home / Self Care | Payer: Medicare Other | Source: Ambulatory Visit | Attending: Internal Medicine | Admitting: Internal Medicine

## 2015-06-05 DIAGNOSIS — Z01818 Encounter for other preprocedural examination: Secondary | ICD-10-CM | POA: Diagnosis not present

## 2015-06-05 DIAGNOSIS — C3491 Malignant neoplasm of unspecified part of right bronchus or lung: Secondary | ICD-10-CM | POA: Diagnosis not present

## 2015-06-08 DIAGNOSIS — K219 Gastro-esophageal reflux disease without esophagitis: Secondary | ICD-10-CM | POA: Diagnosis not present

## 2015-06-08 DIAGNOSIS — I10 Essential (primary) hypertension: Secondary | ICD-10-CM | POA: Diagnosis not present

## 2015-06-08 DIAGNOSIS — E1029 Type 1 diabetes mellitus with other diabetic kidney complication: Secondary | ICD-10-CM | POA: Diagnosis not present

## 2015-06-08 DIAGNOSIS — M5093 Cervical disc disorder, unspecified, cervicothoracic region: Secondary | ICD-10-CM | POA: Diagnosis not present

## 2015-06-11 ENCOUNTER — Ambulatory Visit: Admission: RE | Admit: 2015-06-11 | Payer: Medicare Other | Source: Ambulatory Visit | Admitting: Podiatry

## 2015-06-11 HISTORY — DX: Polyneuropathy, unspecified: G62.9

## 2015-06-11 HISTORY — DX: Reserved for inherently not codable concepts without codable children: IMO0001

## 2015-06-11 SURGERY — CORRECTION, HAMMER TOE
Anesthesia: General | Laterality: Left

## 2015-06-19 ENCOUNTER — Ambulatory Visit: Admission: RE | Admit: 2015-06-19 | Payer: Medicare Other | Source: Ambulatory Visit

## 2015-10-22 DIAGNOSIS — C3491 Malignant neoplasm of unspecified part of right bronchus or lung: Secondary | ICD-10-CM | POA: Diagnosis not present

## 2015-10-22 DIAGNOSIS — C7931 Secondary malignant neoplasm of brain: Secondary | ICD-10-CM | POA: Diagnosis not present

## 2015-10-22 DIAGNOSIS — K769 Liver disease, unspecified: Secondary | ICD-10-CM | POA: Diagnosis not present

## 2015-10-22 DIAGNOSIS — R339 Retention of urine, unspecified: Secondary | ICD-10-CM | POA: Diagnosis not present

## 2015-10-22 DIAGNOSIS — C3431 Malignant neoplasm of lower lobe, right bronchus or lung: Secondary | ICD-10-CM | POA: Diagnosis not present

## 2015-10-22 DIAGNOSIS — E1142 Type 2 diabetes mellitus with diabetic polyneuropathy: Secondary | ICD-10-CM | POA: Diagnosis not present

## 2015-10-22 DIAGNOSIS — R918 Other nonspecific abnormal finding of lung field: Secondary | ICD-10-CM | POA: Diagnosis not present

## 2015-10-22 DIAGNOSIS — I10 Essential (primary) hypertension: Secondary | ICD-10-CM | POA: Diagnosis not present

## 2015-10-22 DIAGNOSIS — D496 Neoplasm of unspecified behavior of brain: Secondary | ICD-10-CM | POA: Diagnosis not present

## 2015-10-22 DIAGNOSIS — C787 Secondary malignant neoplasm of liver and intrahepatic bile duct: Secondary | ICD-10-CM | POA: Diagnosis not present

## 2015-10-22 DIAGNOSIS — M6281 Muscle weakness (generalized): Secondary | ICD-10-CM | POA: Diagnosis not present

## 2015-10-22 DIAGNOSIS — M79605 Pain in left leg: Secondary | ICD-10-CM | POA: Diagnosis not present

## 2015-10-22 DIAGNOSIS — E1165 Type 2 diabetes mellitus with hyperglycemia: Secondary | ICD-10-CM | POA: Diagnosis not present

## 2015-10-22 DIAGNOSIS — I340 Nonrheumatic mitral (valve) insufficiency: Secondary | ICD-10-CM | POA: Diagnosis not present

## 2015-10-22 DIAGNOSIS — R51 Headache: Secondary | ICD-10-CM | POA: Diagnosis not present

## 2015-10-22 DIAGNOSIS — G936 Cerebral edema: Secondary | ICD-10-CM | POA: Diagnosis not present

## 2015-10-22 DIAGNOSIS — G4733 Obstructive sleep apnea (adult) (pediatric): Secondary | ICD-10-CM | POA: Diagnosis not present

## 2015-10-22 DIAGNOSIS — R110 Nausea: Secondary | ICD-10-CM | POA: Diagnosis not present

## 2015-10-22 DIAGNOSIS — G939 Disorder of brain, unspecified: Secondary | ICD-10-CM | POA: Diagnosis not present

## 2015-10-22 DIAGNOSIS — R29898 Other symptoms and signs involving the musculoskeletal system: Secondary | ICD-10-CM | POA: Diagnosis not present

## 2015-10-22 DIAGNOSIS — K5900 Constipation, unspecified: Secondary | ICD-10-CM | POA: Diagnosis not present

## 2015-10-22 DIAGNOSIS — Z01818 Encounter for other preprocedural examination: Secondary | ICD-10-CM | POA: Diagnosis not present

## 2015-10-22 DIAGNOSIS — E785 Hyperlipidemia, unspecified: Secondary | ICD-10-CM | POA: Diagnosis not present

## 2015-10-22 DIAGNOSIS — Z794 Long term (current) use of insulin: Secondary | ICD-10-CM | POA: Diagnosis not present

## 2015-10-22 DIAGNOSIS — R200 Anesthesia of skin: Secondary | ICD-10-CM | POA: Diagnosis not present

## 2015-10-22 DIAGNOSIS — R0602 Shortness of breath: Secondary | ICD-10-CM | POA: Diagnosis not present

## 2015-10-22 DIAGNOSIS — G9389 Other specified disorders of brain: Secondary | ICD-10-CM | POA: Diagnosis not present

## 2015-10-22 DIAGNOSIS — Z85118 Personal history of other malignant neoplasm of bronchus and lung: Secondary | ICD-10-CM | POA: Diagnosis not present

## 2015-10-28 ENCOUNTER — Telehealth: Payer: Self-pay | Admitting: *Deleted

## 2015-10-28 NOTE — Telephone Encounter (Signed)
rcvd phone call from [place].  Pt admitted and tx for brain tumor. Reports that has metastatic disease. Pt being d/c from hp tomorrow. [facility] asking for apt with Dr. Rogue Bussing.  Per Dr. Vonzell Schlatter apt can be scheduled next Wednesday 11/04/15 at 1045 am.

## 2015-11-01 DIAGNOSIS — I10 Essential (primary) hypertension: Secondary | ICD-10-CM | POA: Diagnosis not present

## 2015-11-01 DIAGNOSIS — Z791 Long term (current) use of non-steroidal anti-inflammatories (NSAID): Secondary | ICD-10-CM | POA: Diagnosis not present

## 2015-11-01 DIAGNOSIS — R918 Other nonspecific abnormal finding of lung field: Secondary | ICD-10-CM | POA: Diagnosis not present

## 2015-11-01 DIAGNOSIS — D432 Neoplasm of uncertain behavior of brain, unspecified: Secondary | ICD-10-CM | POA: Diagnosis not present

## 2015-11-01 DIAGNOSIS — K769 Liver disease, unspecified: Secondary | ICD-10-CM | POA: Diagnosis not present

## 2015-11-01 DIAGNOSIS — E1165 Type 2 diabetes mellitus with hyperglycemia: Secondary | ICD-10-CM | POA: Diagnosis not present

## 2015-11-01 DIAGNOSIS — Z794 Long term (current) use of insulin: Secondary | ICD-10-CM | POA: Diagnosis not present

## 2015-11-01 DIAGNOSIS — Z79891 Long term (current) use of opiate analgesic: Secondary | ICD-10-CM | POA: Diagnosis not present

## 2015-11-01 DIAGNOSIS — C3431 Malignant neoplasm of lower lobe, right bronchus or lung: Secondary | ICD-10-CM | POA: Diagnosis not present

## 2015-11-01 DIAGNOSIS — Z483 Aftercare following surgery for neoplasm: Secondary | ICD-10-CM | POA: Diagnosis not present

## 2015-11-01 DIAGNOSIS — E785 Hyperlipidemia, unspecified: Secondary | ICD-10-CM | POA: Diagnosis not present

## 2015-11-03 DIAGNOSIS — I10 Essential (primary) hypertension: Secondary | ICD-10-CM | POA: Diagnosis not present

## 2015-11-03 DIAGNOSIS — E785 Hyperlipidemia, unspecified: Secondary | ICD-10-CM | POA: Diagnosis not present

## 2015-11-03 DIAGNOSIS — D432 Neoplasm of uncertain behavior of brain, unspecified: Secondary | ICD-10-CM | POA: Diagnosis not present

## 2015-11-03 DIAGNOSIS — R918 Other nonspecific abnormal finding of lung field: Secondary | ICD-10-CM | POA: Diagnosis not present

## 2015-11-03 DIAGNOSIS — C3431 Malignant neoplasm of lower lobe, right bronchus or lung: Secondary | ICD-10-CM | POA: Diagnosis not present

## 2015-11-03 DIAGNOSIS — Z791 Long term (current) use of non-steroidal anti-inflammatories (NSAID): Secondary | ICD-10-CM | POA: Diagnosis not present

## 2015-11-03 DIAGNOSIS — K769 Liver disease, unspecified: Secondary | ICD-10-CM | POA: Diagnosis not present

## 2015-11-03 DIAGNOSIS — Z483 Aftercare following surgery for neoplasm: Secondary | ICD-10-CM | POA: Diagnosis not present

## 2015-11-03 DIAGNOSIS — Z79891 Long term (current) use of opiate analgesic: Secondary | ICD-10-CM | POA: Diagnosis not present

## 2015-11-03 DIAGNOSIS — Z794 Long term (current) use of insulin: Secondary | ICD-10-CM | POA: Diagnosis not present

## 2015-11-03 DIAGNOSIS — E1165 Type 2 diabetes mellitus with hyperglycemia: Secondary | ICD-10-CM | POA: Diagnosis not present

## 2015-11-04 ENCOUNTER — Ambulatory Visit
Admission: RE | Admit: 2015-11-04 | Discharge: 2015-11-04 | Disposition: A | Discharge status: Home / Self Care | Payer: Medicare Other | Source: Ambulatory Visit | Attending: Radiation Oncology | Admitting: Radiation Oncology

## 2015-11-04 ENCOUNTER — Inpatient Hospital Stay: Payer: Medicare Other | Attending: Internal Medicine | Admitting: Internal Medicine

## 2015-11-04 VITALS — BP 111/74 | HR 67 | Temp 97.6°F | Resp 18 | Wt 199.1 lb

## 2015-11-04 DIAGNOSIS — C7931 Secondary malignant neoplasm of brain: Secondary | ICD-10-CM | POA: Diagnosis not present

## 2015-11-04 DIAGNOSIS — C3431 Malignant neoplasm of lower lobe, right bronchus or lung: Secondary | ICD-10-CM | POA: Diagnosis not present

## 2015-11-04 DIAGNOSIS — Z79899 Other long term (current) drug therapy: Secondary | ICD-10-CM

## 2015-11-04 DIAGNOSIS — I739 Peripheral vascular disease, unspecified: Secondary | ICD-10-CM | POA: Insufficient documentation

## 2015-11-04 DIAGNOSIS — R5383 Other fatigue: Secondary | ICD-10-CM | POA: Diagnosis not present

## 2015-11-04 DIAGNOSIS — Z9889 Other specified postprocedural states: Secondary | ICD-10-CM | POA: Diagnosis not present

## 2015-11-04 DIAGNOSIS — I509 Heart failure, unspecified: Secondary | ICD-10-CM | POA: Insufficient documentation

## 2015-11-04 DIAGNOSIS — M129 Arthropathy, unspecified: Secondary | ICD-10-CM | POA: Insufficient documentation

## 2015-11-04 DIAGNOSIS — M1990 Unspecified osteoarthritis, unspecified site: Secondary | ICD-10-CM | POA: Diagnosis not present

## 2015-11-04 DIAGNOSIS — E1140 Type 2 diabetes mellitus with diabetic neuropathy, unspecified: Secondary | ICD-10-CM

## 2015-11-04 DIAGNOSIS — R51 Headache: Secondary | ICD-10-CM

## 2015-11-04 DIAGNOSIS — Z7984 Long term (current) use of oral hypoglycemic drugs: Secondary | ICD-10-CM | POA: Insufficient documentation

## 2015-11-04 DIAGNOSIS — Z794 Long term (current) use of insulin: Secondary | ICD-10-CM | POA: Insufficient documentation

## 2015-11-04 DIAGNOSIS — R531 Weakness: Secondary | ICD-10-CM | POA: Diagnosis not present

## 2015-11-04 DIAGNOSIS — F17210 Nicotine dependence, cigarettes, uncomplicated: Secondary | ICD-10-CM | POA: Insufficient documentation

## 2015-11-04 DIAGNOSIS — E119 Type 2 diabetes mellitus without complications: Secondary | ICD-10-CM | POA: Insufficient documentation

## 2015-11-04 DIAGNOSIS — G629 Polyneuropathy, unspecified: Secondary | ICD-10-CM | POA: Insufficient documentation

## 2015-11-04 DIAGNOSIS — I110 Hypertensive heart disease with heart failure: Secondary | ICD-10-CM | POA: Diagnosis not present

## 2015-11-04 DIAGNOSIS — R591 Generalized enlarged lymph nodes: Secondary | ICD-10-CM | POA: Insufficient documentation

## 2015-11-04 DIAGNOSIS — Z510 Encounter for antineoplastic radiation therapy: Secondary | ICD-10-CM | POA: Insufficient documentation

## 2015-11-04 DIAGNOSIS — R0602 Shortness of breath: Secondary | ICD-10-CM | POA: Insufficient documentation

## 2015-11-04 DIAGNOSIS — J449 Chronic obstructive pulmonary disease, unspecified: Secondary | ICD-10-CM

## 2015-11-04 DIAGNOSIS — C3492 Malignant neoplasm of unspecified part of left bronchus or lung: Secondary | ICD-10-CM | POA: Diagnosis not present

## 2015-11-04 DIAGNOSIS — G9619 Other disorders of meninges, not elsewhere classified: Secondary | ICD-10-CM | POA: Insufficient documentation

## 2015-11-04 DIAGNOSIS — Z87891 Personal history of nicotine dependence: Secondary | ICD-10-CM | POA: Diagnosis not present

## 2015-11-04 DIAGNOSIS — I10 Essential (primary) hypertension: Secondary | ICD-10-CM | POA: Insufficient documentation

## 2015-11-04 DIAGNOSIS — Z880 Allergy status to penicillin: Secondary | ICD-10-CM | POA: Insufficient documentation

## 2015-11-04 DIAGNOSIS — R918 Other nonspecific abnormal finding of lung field: Secondary | ICD-10-CM | POA: Insufficient documentation

## 2015-11-04 DIAGNOSIS — R110 Nausea: Secondary | ICD-10-CM

## 2015-11-04 NOTE — Assessment & Plan Note (Addendum)
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 Discussed with the patient that this is stage IV /incurable. Palliative systemic treatments would be recommended for  disease control.   # Mets to brain- cont steroids;  Plan WBRT; discussed with Dr.Crystal. Patient wants radiation locally.   # Headaches- on dex- recommend 2 mg BID ; on Keppra  # s/p craniotomy- staples removal; will check with surgeron at [facility]. Patient has significant difficulty with transportation/social issues. She would like to get her treatments closer home.  # follow up in 2 weeks/ labs.

## 2015-11-04 NOTE — Progress Notes (Signed)
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 status post wedge resection [Dr.Oakes; initially found 2013] [1.6cm; visceral pleural invasion];  CT-June 2016- NEG; NO adj chemo [Dr.Pandit]  # Post thoracotomy pain   # DM/peripheral neuropathy/ Foot infection/ surgery [2017]     Malignant neoplasm of lower lobe of right lung ([place])   10/02/2014 Initial Diagnosis    Malignant neoplasm of lower lobe of right lung [facility])      Oncology History   # July 2017- R hilar LN ~2.5cm;  Brain- 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]- metastatic adeno ca   #  May 2016- pT2a cN0 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 status post wedge resection [Dr.Oakes; initially found 2013] [1.6cm; visceral pleural invasion];  CT-June 2016- NEG; NO adj chemo [Dr.Pandit]  # Post thoracotomy pain   # DM/peripheral neuropathy/ Foot infection/ surgery [2017]     Malignant neoplasm of lower lobe of right lung ([place])   10/02/2014 Initial Diagnosis    Malignant neoplasm of lower lobe of right lung [facility])      INTERVAL HISTORY:   60 year old female patient with above history of stage I lung cancer Has been recently diagnosed metastatic adeno ca to brain [s/p brain mass resection- tissue diagnosis].   Patient complained of left lower extremity weakness and numbness; and also headaches and difficulty with gait- this led to evaluation at [facility]- patient had a MRI of the brain that showed a parietal lobe lesion in the brain; with leptomeningeal spread. CT chest- small lung nodules; abdomen- small liver  lesions. The extracranial lesions were thought to be too small for biopsy. As the patient was symptomatic from the brain lesion/and needed tissue diagnosis- underwent craniotomy resection. Positive for adenocarcinoma; lung origin.  Patient is currently using a wheelchair. Continues to have intermittent headaches. Intermittent nausea no vomiting. Complains of fatigue.   REVIEW OF SYSTEMS:  A complete 10 point review of system is done which is negative except mentioned above/history of present illness. Also has chronic foot infection.  PAST MEDICAL HISTORY :  Past Medical History:  Diagnosis Date  . Arthritis   . Cancer ([place])    Lung CA right wedge removed  . CHF (congestive heart failure) ([place])   . COPD (chronic obstructive pulmonary disease) ([facility])    has oxygen but doesn't use  . Diabetes mellitus type 2 with neurological manifestations ([place])    Peripheral neuropathy and foot ulcers  . Hypertension   . PAD (peripheral artery disease) ([place])   . Peripheral neuropathy ([place])   . Shortness of breath dyspnea     PAST SURGICAL HISTORY :   Past Surgical History:  Procedure Laterality Date  . ABDOMINAL HYSTERECTOMY    . BACK SURGERY  x 7  . CARPAL TUNNEL RELEASE Bilateral   . CESAREAN SECTION     x3  . CORONARY ANGIOPLASTY     no stents  . FOOT SURGERY Left    diabetic ulcer  . KNEE ARTHROSCOPY Bilateral   . TONSILLECTOMY    . VIDEO ASSISTED THORACOSCOPY (VATS)/THOROCOTOMY Right 08/26/2014   Procedure: VIDEO ASSISTED THORACOSCOPY (VATS)/THOROCOTOMY;  Surgeon: Christia Reading  Genevive Bi, MD;  Location: [facility] ORS;  Service: General;  Laterality: Right;    FAMILY HISTORY :   Family History  Problem Relation Age of Onset  . Hypertension Brother   . Coronary artery disease Mother   . Coronary artery disease Father   . Diabetes Mellitus II Brother   . Diabetes Mellitus II Father   . Diabetes Mellitus II Mother     SOCIAL HISTORY:   Social History  Substance Use Topics  . Smoking status:  Current Every Day Smoker    Packs/day: 0.50    Years: 46.00    Types: Cigarettes  . Smokeless tobacco: Never Used     Comment: stopped smoking 08/25/14  . Alcohol use No    ALLERGIES:  is allergic to ampicillin; penicillins; avelox [moxifloxacin hcl in nacl]; metformin; etodolac; and neurontin [gabapentin].  MEDICATIONS:  Current Outpatient Prescriptions  Medication Sig Dispense Refill  . ALPRAZolam (XANAX) 0.25 MG tablet Take 0.25 mg by mouth 2 (two) times daily.    . budesonide-formoterol (SYMBICORT) 160-4.5 MCG/ACT inhaler Inhale 2 puffs into the lungs 2 (two) times daily. Reported on 05/20/2015    . cyclobenzaprine (FLEXERIL) 10 MG tablet Take 10 mg by mouth 3 (three) times daily.     . diazepam (VALIUM) 5 MG tablet Take 2 mg by mouth daily.     . diphenhydrAMINE (BENADRYL) 25 MG tablet Take 25 mg by mouth at bedtime.    . DULoxetine (CYMBALTA) 60 MG capsule Take 60 mg by mouth daily.    Marland Kitchen gemfibrozil (LOPID) 600 MG tablet Take 600 mg by mouth 2 (two) times daily before a meal.    . glipiZIDE (GLUCOTROL) 5 MG tablet Take 5 mg by mouth 2 (two) times daily before a meal.     . ibuprofen (ADVIL,MOTRIN) 100 MG tablet Take 100 mg by mouth every 6 (six) hours as needed for fever.    . Insulin Glargine (TOUJEO SOLOSTAR) 300 UNIT/ML SOPN Inject 50 Units into the skin as directed.    . metFORMIN (GLUCOPHAGE) 1000 MG tablet Take by mouth.    . pantoprazole (PROTONIX) 40 MG tablet Take by mouth.    . promethazine (PHENERGAN) 12.5 MG tablet Take by mouth.    . propranolol (INDERAL) 40 MG tablet Take 40 mg by mouth 2 (two) times daily.    . rosuvastatin (CRESTOR) 10 MG tablet Take 10 mg by mouth daily.    Marland Kitchen senna-docusate (SENOKOT-S) 8.6-50 MG tablet Take by mouth.    . dexamethasone (DECADRON) 1 MG tablet     . levETIRAcetam (KEPPRA) 500 MG tablet     . LYRICA 150 MG capsule     . oxyCODONE (OXY IR/ROXICODONE) 5 MG immediate release tablet      No current facility-administered medications for  this visit.     PHYSICAL EXAMINATION: ECOG PERFORMANCE STATUS: 2 - Symptomatic, <50% confined to bed  BP 111/74 (BP Location: Left Arm, Patient Position: Sitting)   Pulse 67   Temp 97.6 F (36.4 C) (Tympanic)   Resp 18   Wt 199 lb 2 oz (90.3 kg)   BMI 33.14 kg/m   Filed Weights   11/04/15 1112  Weight: 199 lb 2 oz (90.3 kg)    GENERAL: Well-nourished well-developed; Alert, no distress and comfortable.   In a wheelchair accompanied by her son. EYES: no pallor or icterus OROPHARYNX: no thrush or ulceration  NECK: supple, no masses felt LYMPH:  no palpable lymphadenopathy in the cervical, axillary or inguinal regions LUNGS: clear  to auscultation and  No wheeze or crackles HEART/CVS: regular rate & rhythm and no murmurs; No lower extremity edema ABDOMEN:abdomen soft, non-tender and normal bowel sounds Musculoskeletal:no cyanosis of digits and no clubbing  PSYCH: alert & oriented x 3 with fluent speech NEURO: Mild-to-moderate weakness in the left lower extremity. Difficulty with gait. SKIN:  no rashes or significant lesions  LABORATORY DATA:  I have reviewed the data as listed    Component Value Date/Time   NA 131 (L) 05/18/2015 1030   NA 134 (L) 07/24/2014 1215   K 3.7 05/18/2015 1030   K 4.2 07/24/2014 1215   CL 101 05/18/2015 1030   CL 100 (L) 07/24/2014 1215   CO2 22 05/18/2015 1030   CO2 25 07/24/2014 1215   GLUCOSE 230 (H) 05/18/2015 1030   GLUCOSE 213 (H) 07/24/2014 1215   BUN 21 (H) 05/18/2015 1030   BUN 26 (H) 07/24/2014 1215   CREATININE 0.58 05/18/2015 1030   CREATININE 0.71 07/24/2014 1215   CALCIUM 9.4 05/18/2015 1030   CALCIUM 9.8 07/24/2014 1215   PROT 7.9 05/18/2015 1030   PROT 8.7 (H) 07/24/2014 1215   ALBUMIN 4.2 05/18/2015 1030   ALBUMIN 4.5 07/24/2014 1215   AST 35 05/18/2015 1030   AST 39 07/24/2014 1215   ALT 26 05/18/2015 1030   ALT 37 07/24/2014 1215   ALKPHOS 172 (H) 05/18/2015 1030   ALKPHOS 159 (H) 07/24/2014 1215   BILITOT 0.5  05/18/2015 1030   BILITOT 0.5 07/24/2014 1215   GFRNONAA >60 05/18/2015 1030   GFRNONAA >60 07/24/2014 1215   GFRAA >60 05/18/2015 1030   GFRAA >60 07/24/2014 1215    No results found for: SPEP, UPEP  Lab Results  Component Value Date   WBC 9.7 05/18/2015   NEUTROABS 5.3 05/18/2015   HGB 14.5 05/18/2015   HCT 41.7 05/18/2015   MCV 90.4 05/18/2015   PLT 262 05/18/2015      Chemistry      Component Value Date/Time   NA 131 (L) 05/18/2015 1030   NA 134 (L) 07/24/2014 1215   K 3.7 05/18/2015 1030   K 4.2 07/24/2014 1215   CL 101 05/18/2015 1030   CL 100 (L) 07/24/2014 1215   CO2 22 05/18/2015 1030   CO2 25 07/24/2014 1215   BUN 21 (H) 05/18/2015 1030   BUN 26 (H) 07/24/2014 1215   CREATININE 0.58 05/18/2015 1030   CREATININE 0.71 07/24/2014 1215      Component Value Date/Time   CALCIUM 9.4 05/18/2015 1030   CALCIUM 9.8 07/24/2014 1215   ALKPHOS 172 (H) 05/18/2015 1030   ALKPHOS 159 (H) 07/24/2014 1215   AST 35 05/18/2015 1030   AST 39 07/24/2014 1215   ALT 26 05/18/2015 1030   ALT 37 07/24/2014 1215   BILITOT 0.5 05/18/2015 1030   BILITOT 0.5 07/24/2014 1215       RADIOGRAPHIC STUDIES: I have personally reviewed the radiological images as listed and agreed with the findings in the report. No results found.   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 Discussed with the patient that this is stage IV /incurable. Palliative systemic treatments would be recommended for  disease control.   # Mets to brain- cont steroids;  Plan WBRT; discussed with Dr.Crystal.   # Headaches- on dex- recommend 2 mg BID ; on Keppra  # s/p craniotomy- staples removal; will  [facility]geron at [facility]. Patient has significant difficulty with transportation/social issues. She would like to get her treatments closer home.  # follow up in 2 weeks/ labs.    # 25 minutes  face-to-face with the patient discussing the above plan of care; more than 50% of time spent on prognosis/ natural history; counseling and coordination.    Cammie Sickle, MD 11/04/2015 1:29 PM

## 2015-11-04 NOTE — Progress Notes (Signed)
Except an outstanding is perfect of Radiation Oncology NEW PATIENT EVALUATION  Name: Marie [name]  MRN: [id number]  Date:   11/04/2015     DOB: [birth year]   This 60 y.o. female patient presents to the clinic for initial evaluation of brain metastasis with leptomeningeal involvement.  REFERRING PHYSICIAN: Cletis Athens, MD  CHIEF COMPLAINT: No chief complaint on file.   DIAGNOSIS: The encounter diagnosis was Brain metastasis ([place]).   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 She is other comorbidities including peripheral neuropathy and adult onset diabetes. She recently presented with incoordination difficulty ambulating and headaches. She was found to have a 2.5 cm right parietal mass with leptomeningeal involvement. She underwent biopsy which was positive for adenocarcinoma consistent with lung origin. Her initial pathology did show visceropleural involvement. She had her surgery done at [facility]. She's been asked to have her treatment locally and I been asked to evaluate her for her solitary brain metastasis with leptomeningeal spread.initial disease on MRI scan showed a enhancing intra-axial mass in the superior medial right parietal lobe abutting the superior sagittal sinus measuring approximate 1.5 cm in greatest dimension. Also was noted was hyperenhancement in the adjacent leptomeningeal region. Her make sure complaint this time is difficulty ambulating and falling.patient also does have necrotic right hilar lymph nodes concerning for metastatic disease as well as multiple small pulmonary nodules bilaterally.  PLANNED TREATMENT REGIMEN: whole brain radiation plus I MRT boost  PAST MEDICAL HISTORY:  has a past medical history of  Arthritis; Cancer [facility]); CHF (congestive heart failure) ([place]); COPD (chronic obstructive pulmonary disease) ([place]); Diabetes mellitus type 2 with neurological manifestations ([place]); Hypertension; PAD (peripheral artery disease) ([place]); Peripheral neuropathy ([place]); and Shortness of breath dyspnea.    PAST SURGICAL HISTORY:  Past Surgical History:  Procedure Laterality Date  . ABDOMINAL HYSTERECTOMY    . BACK SURGERY  x 7  . CARPAL TUNNEL RELEASE Bilateral   . CESAREAN SECTION     x3  . CORONARY ANGIOPLASTY     no stents  . FOOT SURGERY Left    diabetic ulcer  . KNEE ARTHROSCOPY Bilateral   . TONSILLECTOMY    . VIDEO ASSISTED THORACOSCOPY (VATS)/THOROCOTOMY Right 08/26/2014   Procedure: VIDEO ASSISTED THORACOSCOPY (VATS)/THOROCOTOMY;  Surgeon: Nestor Lewandowsky, MD;  Location: [facility] ORS;  Service: General;  Laterality: Right;    FAMILY HISTORY: family history includes Coronary artery disease in her father and mother; Diabetes Mellitus II in her brother, father, and mother; Hypertension in her brother.  SOCIAL HISTORY:  reports that she has been smoking Cigarettes.  She has a 23.00 pack-year smoking history. She has never used smokeless tobacco. She reports that she does not drink alcohol or use drugs.  ALLERGIES: Ampicillin; Penicillins; Avelox [moxifloxacin hcl in nacl]; Metformin; Etodolac; and Neurontin [gabapentin]  MEDICATIONS:  Current Outpatient Prescriptions  Medication Sig Dispense Refill  . ALPRAZolam (XANAX) 0.25 MG tablet Take 0.25 mg by mouth 2 (two) times daily.    . budesonide-formoterol (SYMBICORT) 160-4.5 MCG/ACT inhaler Inhale 2 puffs into the lungs 2 (two) times daily. Reported on 05/20/2015    . cyclobenzaprine (FLEXERIL) 10 MG tablet Take 10 mg by mouth 3 (three) times daily.     Marland Kitchen dexamethasone (DECADRON) 1 MG tablet     .  diazepam (VALIUM) 5 MG tablet Take 2 mg by mouth daily.     . diphenhydrAMINE (BENADRYL) 25 MG tablet Take 25 mg by mouth at bedtime.    . DULoxetine  (CYMBALTA) 60 MG capsule Take 60 mg by mouth daily.    Marland Kitchen gemfibrozil (LOPID) 600 MG tablet Take 600 mg by mouth 2 (two) times daily before a meal.    . glipiZIDE (GLUCOTROL) 5 MG tablet Take 5 mg by mouth 2 (two) times daily before a meal.     . ibuprofen (ADVIL,MOTRIN) 100 MG tablet Take 100 mg by mouth every 6 (six) hours as needed for fever.    . Insulin Glargine (TOUJEO SOLOSTAR) 300 UNIT/ML SOPN Inject 50 Units into the skin as directed.    . levETIRAcetam (KEPPRA) 500 MG tablet     . LYRICA 150 MG capsule     . metFORMIN (GLUCOPHAGE) 1000 MG tablet Take by mouth.    . oxyCODONE (OXY IR/ROXICODONE) 5 MG immediate release tablet     . pantoprazole (PROTONIX) 40 MG tablet Take by mouth.    . promethazine (PHENERGAN) 12.5 MG tablet Take by mouth.    . propranolol (INDERAL) 40 MG tablet Take 40 mg by mouth 2 (two) times daily.    . rosuvastatin (CRESTOR) 10 MG tablet Take 10 mg by mouth daily.    Marland Kitchen senna-docusate (SENOKOT-S) 8.6-50 MG tablet Take by mouth.     No current facility-administered medications for this encounter.     ECOG PERFORMANCE STATUS:  1 - Symptomatic but completely ambulatory  REVIEW OF SYSTEMS:  Patient denies any weight loss, fatigue, weakness, fever, chills or night sweats. Patient denies any loss of vision, blurred vision. Patient denies any ringing  of the ears or hearing loss. No irregular heartbeat. Patient denies heart murmur or history of fainting. Patient denies any chest pain or pain radiating to her upper extremities. Patient denies any shortness of breath, difficulty breathing at night, cough or hemoptysis. Patient denies any swelling in the lower legs. Patient denies any nausea vomiting, vomiting of blood, or coffee ground material in the vomitus. Patient denies any stomach pain. Patient states has had normal bowel movements no significant constipation or diarrhea. Patient denies any dysuria, hematuria or significant nocturia. Patient denies any problems  walking, swelling in the joints or loss of balance. Patient denies any skin changes, loss of hair or loss of weight. Patient denies any excessive worrying or anxiety or significant depression. Patient denies any problems with insomnia. Patient denies excessive thirst, polyuria, polydipsia. Patient denies any swollen glands, patient denies easy bruising or easy bleeding. Patient denies any recent infections, allergies or URI. Patient "s visual fields have not changed significantly in recent time.    PHYSICAL EXAM: There were no vitals taken for this visit. Well-developed female in NAD. Crude visual fields are within normal range motor sensory and DTR levels are equal and symmetric in upper lower extremities. She has decreased proprioception in the left lower extremity. Well-developed well-nourished patient in NAD. HEENT reveals PERLA, EOMI, discs not visualized.  Oral cavity is clear. No oral mucosal lesions are identified. Neck is clear without evidence of cervical or supraclavicular adenopathy. Lungs are clear to A&P. Cardiac examination is essentially unremarkable with regular rate and rhythm without murmur rub or thrill. Abdomen is benign with no organomegaly or masses noted. Motor sensory and DTR levels are equal and symmetric in the upper and lower extremities. Cranial nerves II through XII are grossly intact. Proprioception is intact. No  peripheral adenopathy or edema is identified. No motor or sensory levels are noted. Crude visual fields are within normal range.  LABORATORY DATA: pathology report reviewed    RADIOLOGY RESULTS:MRI scan and CT scans of brain reports are reviewed films have been requested for my review   IMPRESSION: stage IV lung cancer with brain metastasis.  PLAN: at this time I like to go ahead with whole brain radiation therapy to 3000 cGy in 12 fractions based on leptomeningeal involvement and potential spread throughout the entire meningeal region. I would also boost her  primary area of tumor involvement in the brain using I am RT hypofractionated regiment 2 2000 cGy in 5 fractions. Risks and benefits of treatment including hair loss possible cognitive decline, skin reaction, fatigue and alteration of blood counts all were explained in detail to the patient. She seems to comprehend my treatment plan well. I have set her up andordered CT simulation for tomorrow. Patient is currently on Decadron and that will continue throughout her treatments.  I would like to take this opportunity to thank you for allowing me to participate in the care of your patient.Armstead Peaks., MD

## 2015-11-04 NOTE — Progress Notes (Signed)
Called left msg for Dr. Theora Master nurse at [facility]  Pt is scheduled for suture removal on 7/28 and apt with Dr. Lacinda Axon. Pt states that she is unable to get transportation for this appointment. She would like to best know how to coordinate suture removal and post op care. Pending call back from the dept from [place], [place].   Pt has numerous apts at [facility] for radiation oncology-Dr. Gertie Fey cancer care on 7/28. These apts may be cnl per Dr. Rogue Bussing as patient has elected to have radiation therapy at [facility].  I contacted this dept. I contacted radiation oncology dept at [phone]. I was referred to speak to Ignatius Specking, NP coordinator for radiation oncology.  I left a msg with Ignatius Specking, NP at [phone] to coordinate her cnl apts.

## 2015-11-04 NOTE — Progress Notes (Signed)
Patient sent back to [place] from [facility].  Referred from Dr. Evalee Jefferson.  Patient with history of lung cancer with mets to brain. Patient states she is falling a lot.

## 2015-11-05 ENCOUNTER — Ambulatory Visit
Admission: RE | Admit: 2015-11-05 | Discharge: 2015-11-05 | Disposition: A | Discharge status: Home / Self Care | Payer: Medicare Other | Source: Ambulatory Visit | Attending: Radiation Oncology | Admitting: Radiation Oncology

## 2015-11-05 DIAGNOSIS — G9619 Other disorders of meninges, not elsewhere classified: Secondary | ICD-10-CM | POA: Diagnosis not present

## 2015-11-05 DIAGNOSIS — J449 Chronic obstructive pulmonary disease, unspecified: Secondary | ICD-10-CM | POA: Diagnosis not present

## 2015-11-05 DIAGNOSIS — E119 Type 2 diabetes mellitus without complications: Secondary | ICD-10-CM | POA: Diagnosis not present

## 2015-11-05 DIAGNOSIS — C3492 Malignant neoplasm of unspecified part of left bronchus or lung: Secondary | ICD-10-CM | POA: Diagnosis not present

## 2015-11-05 DIAGNOSIS — G629 Polyneuropathy, unspecified: Secondary | ICD-10-CM | POA: Diagnosis not present

## 2015-11-05 DIAGNOSIS — R918 Other nonspecific abnormal finding of lung field: Secondary | ICD-10-CM | POA: Diagnosis not present

## 2015-11-05 DIAGNOSIS — Z510 Encounter for antineoplastic radiation therapy: Secondary | ICD-10-CM | POA: Diagnosis not present

## 2015-11-05 DIAGNOSIS — Z794 Long term (current) use of insulin: Secondary | ICD-10-CM | POA: Diagnosis not present

## 2015-11-05 DIAGNOSIS — R591 Generalized enlarged lymph nodes: Secondary | ICD-10-CM | POA: Diagnosis not present

## 2015-11-05 DIAGNOSIS — M129 Arthropathy, unspecified: Secondary | ICD-10-CM | POA: Diagnosis not present

## 2015-11-05 DIAGNOSIS — Z7984 Long term (current) use of oral hypoglycemic drugs: Secondary | ICD-10-CM | POA: Diagnosis not present

## 2015-11-05 DIAGNOSIS — I739 Peripheral vascular disease, unspecified: Secondary | ICD-10-CM | POA: Diagnosis not present

## 2015-11-05 DIAGNOSIS — E1140 Type 2 diabetes mellitus with diabetic neuropathy, unspecified: Secondary | ICD-10-CM | POA: Diagnosis not present

## 2015-11-05 DIAGNOSIS — R0602 Shortness of breath: Secondary | ICD-10-CM | POA: Diagnosis not present

## 2015-11-05 DIAGNOSIS — I10 Essential (primary) hypertension: Secondary | ICD-10-CM | POA: Diagnosis not present

## 2015-11-05 DIAGNOSIS — I509 Heart failure, unspecified: Secondary | ICD-10-CM | POA: Diagnosis not present

## 2015-11-05 DIAGNOSIS — C3431 Malignant neoplasm of lower lobe, right bronchus or lung: Secondary | ICD-10-CM | POA: Diagnosis not present

## 2015-11-05 DIAGNOSIS — Z79899 Other long term (current) drug therapy: Secondary | ICD-10-CM | POA: Diagnosis not present

## 2015-11-05 DIAGNOSIS — F17210 Nicotine dependence, cigarettes, uncomplicated: Secondary | ICD-10-CM | POA: Diagnosis not present

## 2015-11-05 DIAGNOSIS — C7931 Secondary malignant neoplasm of brain: Secondary | ICD-10-CM | POA: Diagnosis not present

## 2015-11-06 ENCOUNTER — Other Ambulatory Visit: Payer: Self-pay | Admitting: *Deleted

## 2015-11-06 ENCOUNTER — Telehealth: Payer: Self-pay | Admitting: *Deleted

## 2015-11-06 DIAGNOSIS — C3431 Malignant neoplasm of lower lobe, right bronchus or lung: Secondary | ICD-10-CM | POA: Diagnosis not present

## 2015-11-06 DIAGNOSIS — K769 Liver disease, unspecified: Secondary | ICD-10-CM | POA: Diagnosis not present

## 2015-11-06 DIAGNOSIS — E1165 Type 2 diabetes mellitus with hyperglycemia: Secondary | ICD-10-CM | POA: Diagnosis not present

## 2015-11-06 DIAGNOSIS — I10 Essential (primary) hypertension: Secondary | ICD-10-CM | POA: Diagnosis not present

## 2015-11-06 DIAGNOSIS — C7931 Secondary malignant neoplasm of brain: Secondary | ICD-10-CM

## 2015-11-06 DIAGNOSIS — Z79891 Long term (current) use of opiate analgesic: Secondary | ICD-10-CM | POA: Diagnosis not present

## 2015-11-06 DIAGNOSIS — Z4802 Encounter for removal of sutures: Secondary | ICD-10-CM

## 2015-11-06 DIAGNOSIS — R918 Other nonspecific abnormal finding of lung field: Secondary | ICD-10-CM | POA: Diagnosis not present

## 2015-11-06 DIAGNOSIS — E785 Hyperlipidemia, unspecified: Secondary | ICD-10-CM | POA: Diagnosis not present

## 2015-11-06 DIAGNOSIS — Z791 Long term (current) use of non-steroidal anti-inflammatories (NSAID): Secondary | ICD-10-CM | POA: Diagnosis not present

## 2015-11-06 DIAGNOSIS — Z794 Long term (current) use of insulin: Secondary | ICD-10-CM | POA: Diagnosis not present

## 2015-11-06 DIAGNOSIS — Z483 Aftercare following surgery for neoplasm: Secondary | ICD-10-CM | POA: Diagnosis not present

## 2015-11-06 DIAGNOSIS — Z9889 Other specified postprocedural states: Secondary | ICD-10-CM

## 2015-11-06 DIAGNOSIS — D432 Neoplasm of uncertain behavior of brain, unspecified: Secondary | ICD-10-CM | POA: Diagnosis not present

## 2015-11-06 DIAGNOSIS — C3491 Malignant neoplasm of unspecified part of right bronchus or lung: Secondary | ICD-10-CM

## 2015-11-06 NOTE — Telephone Encounter (Signed)
Pt came to cancer center at 1130am to have staples removed from scalp.  V/o obtained from Dr. Rogue Bussing to remove staples.  Staples intact. No signs of infection. Sutures removed per policy. Pt discharged from clinic.

## 2015-11-06 NOTE — Telephone Encounter (Signed)
Called patient. Notified pt that [facility] contacted cancer center. OK- to remove staples. I contacted pt she will see if she can get a ride to cancer center to have staples removed.

## 2015-11-09 DIAGNOSIS — C3431 Malignant neoplasm of lower lobe, right bronchus or lung: Secondary | ICD-10-CM | POA: Diagnosis not present

## 2015-11-09 DIAGNOSIS — I10 Essential (primary) hypertension: Secondary | ICD-10-CM | POA: Diagnosis not present

## 2015-11-09 DIAGNOSIS — Z794 Long term (current) use of insulin: Secondary | ICD-10-CM | POA: Diagnosis not present

## 2015-11-09 DIAGNOSIS — R918 Other nonspecific abnormal finding of lung field: Secondary | ICD-10-CM | POA: Diagnosis not present

## 2015-11-09 DIAGNOSIS — E785 Hyperlipidemia, unspecified: Secondary | ICD-10-CM | POA: Diagnosis not present

## 2015-11-09 DIAGNOSIS — Z791 Long term (current) use of non-steroidal anti-inflammatories (NSAID): Secondary | ICD-10-CM | POA: Diagnosis not present

## 2015-11-09 DIAGNOSIS — D432 Neoplasm of uncertain behavior of brain, unspecified: Secondary | ICD-10-CM | POA: Diagnosis not present

## 2015-11-09 DIAGNOSIS — E1165 Type 2 diabetes mellitus with hyperglycemia: Secondary | ICD-10-CM | POA: Diagnosis not present

## 2015-11-09 DIAGNOSIS — K769 Liver disease, unspecified: Secondary | ICD-10-CM | POA: Diagnosis not present

## 2015-11-09 DIAGNOSIS — Z483 Aftercare following surgery for neoplasm: Secondary | ICD-10-CM | POA: Diagnosis not present

## 2015-11-09 DIAGNOSIS — Z79891 Long term (current) use of opiate analgesic: Secondary | ICD-10-CM | POA: Diagnosis not present

## 2015-11-10 ENCOUNTER — Other Ambulatory Visit: Payer: Self-pay | Admitting: *Deleted

## 2015-11-10 ENCOUNTER — Telehealth: Payer: Self-pay

## 2015-11-10 DIAGNOSIS — Z79899 Other long term (current) drug therapy: Secondary | ICD-10-CM | POA: Diagnosis not present

## 2015-11-10 DIAGNOSIS — Z510 Encounter for antineoplastic radiation therapy: Secondary | ICD-10-CM | POA: Diagnosis not present

## 2015-11-10 DIAGNOSIS — I10 Essential (primary) hypertension: Secondary | ICD-10-CM | POA: Diagnosis not present

## 2015-11-10 DIAGNOSIS — E1140 Type 2 diabetes mellitus with diabetic neuropathy, unspecified: Secondary | ICD-10-CM | POA: Diagnosis not present

## 2015-11-10 DIAGNOSIS — I509 Heart failure, unspecified: Secondary | ICD-10-CM | POA: Diagnosis not present

## 2015-11-10 DIAGNOSIS — M129 Arthropathy, unspecified: Secondary | ICD-10-CM | POA: Diagnosis not present

## 2015-11-10 DIAGNOSIS — R918 Other nonspecific abnormal finding of lung field: Secondary | ICD-10-CM | POA: Diagnosis not present

## 2015-11-10 DIAGNOSIS — G629 Polyneuropathy, unspecified: Secondary | ICD-10-CM | POA: Diagnosis not present

## 2015-11-10 DIAGNOSIS — I739 Peripheral vascular disease, unspecified: Secondary | ICD-10-CM | POA: Diagnosis not present

## 2015-11-10 DIAGNOSIS — Z7984 Long term (current) use of oral hypoglycemic drugs: Secondary | ICD-10-CM | POA: Diagnosis not present

## 2015-11-10 DIAGNOSIS — G9619 Other disorders of meninges, not elsewhere classified: Secondary | ICD-10-CM | POA: Diagnosis not present

## 2015-11-10 DIAGNOSIS — C7931 Secondary malignant neoplasm of brain: Secondary | ICD-10-CM | POA: Diagnosis not present

## 2015-11-10 DIAGNOSIS — C3492 Malignant neoplasm of unspecified part of left bronchus or lung: Secondary | ICD-10-CM | POA: Diagnosis not present

## 2015-11-10 DIAGNOSIS — J449 Chronic obstructive pulmonary disease, unspecified: Secondary | ICD-10-CM | POA: Diagnosis not present

## 2015-11-10 DIAGNOSIS — R591 Generalized enlarged lymph nodes: Secondary | ICD-10-CM | POA: Diagnosis not present

## 2015-11-10 DIAGNOSIS — R0602 Shortness of breath: Secondary | ICD-10-CM | POA: Diagnosis not present

## 2015-11-10 DIAGNOSIS — Z794 Long term (current) use of insulin: Secondary | ICD-10-CM | POA: Diagnosis not present

## 2015-11-10 DIAGNOSIS — E119 Type 2 diabetes mellitus without complications: Secondary | ICD-10-CM | POA: Diagnosis not present

## 2015-11-10 NOTE — Telephone Encounter (Signed)
Patient called to request refill for steroids.  Wasn't sure if she needed a refill?

## 2015-11-11 ENCOUNTER — Other Ambulatory Visit: Payer: Self-pay

## 2015-11-11 ENCOUNTER — Telehealth: Payer: Self-pay

## 2015-11-11 DIAGNOSIS — C7931 Secondary malignant neoplasm of brain: Secondary | ICD-10-CM

## 2015-11-11 DIAGNOSIS — C3431 Malignant neoplasm of lower lobe, right bronchus or lung: Secondary | ICD-10-CM

## 2015-11-11 MED ORDER — DEXAMETHASONE 1 MG PO TABS: 1.0000 mg | ORAL_TABLET | Freq: Every day | ORAL | 0 refills | Status: DC

## 2015-11-11 MED ORDER — OXYCODONE HCL 5 MG PO TABS: ORAL_TABLET | ORAL | 0 refills | Status: DC

## 2015-11-11 NOTE — Telephone Encounter (Signed)
Oxycodone Rx printed and left at front desk. Dexamethasone Rx called into pharmacy

## 2015-11-12 ENCOUNTER — Ambulatory Visit
Admission: RE | Admit: 2015-11-12 | Discharge: 2015-11-12 | Disposition: A | Discharge status: Home / Self Care | Payer: Medicare Other | Source: Ambulatory Visit | Attending: Radiation Oncology | Admitting: Radiation Oncology

## 2015-11-12 ENCOUNTER — Other Ambulatory Visit: Payer: Self-pay | Admitting: *Deleted

## 2015-11-12 DIAGNOSIS — I10 Essential (primary) hypertension: Secondary | ICD-10-CM | POA: Diagnosis not present

## 2015-11-12 DIAGNOSIS — R0602 Shortness of breath: Secondary | ICD-10-CM | POA: Diagnosis not present

## 2015-11-12 DIAGNOSIS — C7931 Secondary malignant neoplasm of brain: Secondary | ICD-10-CM | POA: Diagnosis not present

## 2015-11-12 DIAGNOSIS — R918 Other nonspecific abnormal finding of lung field: Secondary | ICD-10-CM | POA: Diagnosis not present

## 2015-11-12 DIAGNOSIS — Z510 Encounter for antineoplastic radiation therapy: Secondary | ICD-10-CM | POA: Diagnosis not present

## 2015-11-12 DIAGNOSIS — R591 Generalized enlarged lymph nodes: Secondary | ICD-10-CM | POA: Diagnosis not present

## 2015-11-12 DIAGNOSIS — E1140 Type 2 diabetes mellitus with diabetic neuropathy, unspecified: Secondary | ICD-10-CM | POA: Diagnosis not present

## 2015-11-12 DIAGNOSIS — G9619 Other disorders of meninges, not elsewhere classified: Secondary | ICD-10-CM | POA: Diagnosis not present

## 2015-11-12 DIAGNOSIS — J449 Chronic obstructive pulmonary disease, unspecified: Secondary | ICD-10-CM | POA: Diagnosis not present

## 2015-11-12 DIAGNOSIS — C3492 Malignant neoplasm of unspecified part of left bronchus or lung: Secondary | ICD-10-CM | POA: Diagnosis not present

## 2015-11-12 DIAGNOSIS — E119 Type 2 diabetes mellitus without complications: Secondary | ICD-10-CM | POA: Diagnosis not present

## 2015-11-12 DIAGNOSIS — I509 Heart failure, unspecified: Secondary | ICD-10-CM | POA: Diagnosis not present

## 2015-11-12 DIAGNOSIS — Z7984 Long term (current) use of oral hypoglycemic drugs: Secondary | ICD-10-CM | POA: Diagnosis not present

## 2015-11-12 DIAGNOSIS — G629 Polyneuropathy, unspecified: Secondary | ICD-10-CM | POA: Diagnosis not present

## 2015-11-12 DIAGNOSIS — I739 Peripheral vascular disease, unspecified: Secondary | ICD-10-CM | POA: Diagnosis not present

## 2015-11-12 DIAGNOSIS — Z794 Long term (current) use of insulin: Secondary | ICD-10-CM | POA: Diagnosis not present

## 2015-11-12 DIAGNOSIS — M129 Arthropathy, unspecified: Secondary | ICD-10-CM | POA: Diagnosis not present

## 2015-11-12 DIAGNOSIS — Z79899 Other long term (current) drug therapy: Secondary | ICD-10-CM | POA: Diagnosis not present

## 2015-11-12 MED ORDER — DEXAMETHASONE 4 MG PO TABS: 4.0000 mg | ORAL_TABLET | Freq: Every day | ORAL | 0 refills | Status: DC

## 2015-11-16 ENCOUNTER — Other Ambulatory Visit: Payer: Medicare Other

## 2015-11-16 ENCOUNTER — Encounter: Payer: Self-pay | Admitting: Internal Medicine

## 2015-11-16 ENCOUNTER — Ambulatory Visit: Payer: Medicare Other | Admitting: Internal Medicine

## 2015-11-16 ENCOUNTER — Ambulatory Visit
Admission: RE | Admit: 2015-11-16 | Discharge: 2015-11-16 | Disposition: A | Discharge status: Home / Self Care | Payer: Medicare Other | Source: Ambulatory Visit | Attending: Radiation Oncology | Admitting: Radiation Oncology

## 2015-11-16 DIAGNOSIS — E119 Type 2 diabetes mellitus without complications: Secondary | ICD-10-CM | POA: Diagnosis not present

## 2015-11-16 DIAGNOSIS — J449 Chronic obstructive pulmonary disease, unspecified: Secondary | ICD-10-CM | POA: Diagnosis not present

## 2015-11-16 DIAGNOSIS — Z794 Long term (current) use of insulin: Secondary | ICD-10-CM | POA: Diagnosis not present

## 2015-11-16 DIAGNOSIS — R0602 Shortness of breath: Secondary | ICD-10-CM | POA: Diagnosis not present

## 2015-11-16 DIAGNOSIS — M129 Arthropathy, unspecified: Secondary | ICD-10-CM | POA: Diagnosis not present

## 2015-11-16 DIAGNOSIS — G9619 Other disorders of meninges, not elsewhere classified: Secondary | ICD-10-CM | POA: Diagnosis not present

## 2015-11-16 DIAGNOSIS — Z7984 Long term (current) use of oral hypoglycemic drugs: Secondary | ICD-10-CM | POA: Diagnosis not present

## 2015-11-16 DIAGNOSIS — I739 Peripheral vascular disease, unspecified: Secondary | ICD-10-CM | POA: Diagnosis not present

## 2015-11-16 DIAGNOSIS — I509 Heart failure, unspecified: Secondary | ICD-10-CM | POA: Diagnosis not present

## 2015-11-16 DIAGNOSIS — C7931 Secondary malignant neoplasm of brain: Secondary | ICD-10-CM | POA: Diagnosis not present

## 2015-11-16 DIAGNOSIS — I10 Essential (primary) hypertension: Secondary | ICD-10-CM | POA: Diagnosis not present

## 2015-11-16 DIAGNOSIS — C3492 Malignant neoplasm of unspecified part of left bronchus or lung: Secondary | ICD-10-CM | POA: Diagnosis not present

## 2015-11-16 DIAGNOSIS — R591 Generalized enlarged lymph nodes: Secondary | ICD-10-CM | POA: Diagnosis not present

## 2015-11-16 DIAGNOSIS — G629 Polyneuropathy, unspecified: Secondary | ICD-10-CM | POA: Diagnosis not present

## 2015-11-16 DIAGNOSIS — Z510 Encounter for antineoplastic radiation therapy: Secondary | ICD-10-CM | POA: Diagnosis not present

## 2015-11-16 DIAGNOSIS — Z79899 Other long term (current) drug therapy: Secondary | ICD-10-CM | POA: Diagnosis not present

## 2015-11-16 DIAGNOSIS — E1140 Type 2 diabetes mellitus with diabetic neuropathy, unspecified: Secondary | ICD-10-CM | POA: Diagnosis not present

## 2015-11-16 DIAGNOSIS — R918 Other nonspecific abnormal finding of lung field: Secondary | ICD-10-CM | POA: Diagnosis not present

## 2015-11-17 ENCOUNTER — Other Ambulatory Visit: Payer: Self-pay | Admitting: Internal Medicine

## 2015-11-17 ENCOUNTER — Ambulatory Visit
Admission: RE | Admit: 2015-11-17 | Discharge: 2015-11-17 | Disposition: A | Discharge status: Home / Self Care | Payer: Medicare Other | Source: Ambulatory Visit | Attending: Radiation Oncology | Admitting: Radiation Oncology

## 2015-11-17 DIAGNOSIS — M129 Arthropathy, unspecified: Secondary | ICD-10-CM | POA: Diagnosis not present

## 2015-11-17 DIAGNOSIS — J449 Chronic obstructive pulmonary disease, unspecified: Secondary | ICD-10-CM | POA: Diagnosis not present

## 2015-11-17 DIAGNOSIS — E1140 Type 2 diabetes mellitus with diabetic neuropathy, unspecified: Secondary | ICD-10-CM | POA: Diagnosis not present

## 2015-11-17 DIAGNOSIS — R0602 Shortness of breath: Secondary | ICD-10-CM | POA: Diagnosis not present

## 2015-11-17 DIAGNOSIS — C3492 Malignant neoplasm of unspecified part of left bronchus or lung: Secondary | ICD-10-CM | POA: Diagnosis not present

## 2015-11-17 DIAGNOSIS — I10 Essential (primary) hypertension: Secondary | ICD-10-CM | POA: Diagnosis not present

## 2015-11-17 DIAGNOSIS — C7931 Secondary malignant neoplasm of brain: Secondary | ICD-10-CM | POA: Diagnosis not present

## 2015-11-17 DIAGNOSIS — Z7984 Long term (current) use of oral hypoglycemic drugs: Secondary | ICD-10-CM | POA: Diagnosis not present

## 2015-11-17 DIAGNOSIS — G629 Polyneuropathy, unspecified: Secondary | ICD-10-CM | POA: Diagnosis not present

## 2015-11-17 DIAGNOSIS — I739 Peripheral vascular disease, unspecified: Secondary | ICD-10-CM | POA: Diagnosis not present

## 2015-11-17 DIAGNOSIS — Z79899 Other long term (current) drug therapy: Secondary | ICD-10-CM | POA: Diagnosis not present

## 2015-11-17 DIAGNOSIS — R918 Other nonspecific abnormal finding of lung field: Secondary | ICD-10-CM | POA: Diagnosis not present

## 2015-11-17 DIAGNOSIS — R591 Generalized enlarged lymph nodes: Secondary | ICD-10-CM | POA: Diagnosis not present

## 2015-11-17 DIAGNOSIS — Z510 Encounter for antineoplastic radiation therapy: Secondary | ICD-10-CM | POA: Diagnosis not present

## 2015-11-17 DIAGNOSIS — I509 Heart failure, unspecified: Secondary | ICD-10-CM | POA: Diagnosis not present

## 2015-11-17 DIAGNOSIS — Z794 Long term (current) use of insulin: Secondary | ICD-10-CM | POA: Diagnosis not present

## 2015-11-17 DIAGNOSIS — E119 Type 2 diabetes mellitus without complications: Secondary | ICD-10-CM | POA: Diagnosis not present

## 2015-11-17 DIAGNOSIS — G9619 Other disorders of meninges, not elsewhere classified: Secondary | ICD-10-CM | POA: Diagnosis not present

## 2015-11-18 ENCOUNTER — Ambulatory Visit
Admission: RE | Admit: 2015-11-18 | Discharge: 2015-11-18 | Disposition: A | Discharge status: Home / Self Care | Payer: Medicare Other | Source: Ambulatory Visit | Attending: Radiation Oncology | Admitting: Radiation Oncology

## 2015-11-18 ENCOUNTER — Inpatient Hospital Stay: Payer: Medicare Other

## 2015-11-18 ENCOUNTER — Inpatient Hospital Stay: Payer: Medicare Other | Attending: Internal Medicine | Admitting: Internal Medicine

## 2015-11-18 ENCOUNTER — Telehealth: Payer: Self-pay | Admitting: *Deleted

## 2015-11-18 VITALS — BP 115/79 | HR 88 | Temp 97.1°F | Resp 18 | Ht 65.0 in | Wt 206.0 lb

## 2015-11-18 DIAGNOSIS — C7931 Secondary malignant neoplasm of brain: Secondary | ICD-10-CM

## 2015-11-18 DIAGNOSIS — D432 Neoplasm of uncertain behavior of brain, unspecified: Secondary | ICD-10-CM | POA: Diagnosis not present

## 2015-11-18 DIAGNOSIS — Z7952 Long term (current) use of systemic steroids: Secondary | ICD-10-CM | POA: Diagnosis not present

## 2015-11-18 DIAGNOSIS — I739 Peripheral vascular disease, unspecified: Secondary | ICD-10-CM | POA: Diagnosis not present

## 2015-11-18 DIAGNOSIS — I509 Heart failure, unspecified: Secondary | ICD-10-CM | POA: Insufficient documentation

## 2015-11-18 DIAGNOSIS — E1165 Type 2 diabetes mellitus with hyperglycemia: Secondary | ICD-10-CM | POA: Insufficient documentation

## 2015-11-18 DIAGNOSIS — Z880 Allergy status to penicillin: Secondary | ICD-10-CM | POA: Insufficient documentation

## 2015-11-18 DIAGNOSIS — R51 Headache: Secondary | ICD-10-CM | POA: Diagnosis not present

## 2015-11-18 DIAGNOSIS — Z9889 Other specified postprocedural states: Secondary | ICD-10-CM | POA: Diagnosis not present

## 2015-11-18 DIAGNOSIS — R5383 Other fatigue: Secondary | ICD-10-CM | POA: Insufficient documentation

## 2015-11-18 DIAGNOSIS — Z598 Other problems related to housing and economic circumstances: Secondary | ICD-10-CM

## 2015-11-18 DIAGNOSIS — Z79891 Long term (current) use of opiate analgesic: Secondary | ICD-10-CM | POA: Diagnosis not present

## 2015-11-18 DIAGNOSIS — R918 Other nonspecific abnormal finding of lung field: Secondary | ICD-10-CM | POA: Diagnosis not present

## 2015-11-18 DIAGNOSIS — M1990 Unspecified osteoarthritis, unspecified site: Secondary | ICD-10-CM | POA: Diagnosis not present

## 2015-11-18 DIAGNOSIS — Z483 Aftercare following surgery for neoplasm: Secondary | ICD-10-CM | POA: Diagnosis not present

## 2015-11-18 DIAGNOSIS — Z87891 Personal history of nicotine dependence: Secondary | ICD-10-CM | POA: Diagnosis not present

## 2015-11-18 DIAGNOSIS — I110 Hypertensive heart disease with heart failure: Secondary | ICD-10-CM | POA: Insufficient documentation

## 2015-11-18 DIAGNOSIS — K769 Liver disease, unspecified: Secondary | ICD-10-CM | POA: Diagnosis not present

## 2015-11-18 DIAGNOSIS — Z791 Long term (current) use of non-steroidal anti-inflammatories (NSAID): Secondary | ICD-10-CM | POA: Diagnosis not present

## 2015-11-18 DIAGNOSIS — Z794 Long term (current) use of insulin: Secondary | ICD-10-CM | POA: Insufficient documentation

## 2015-11-18 DIAGNOSIS — Z510 Encounter for antineoplastic radiation therapy: Secondary | ICD-10-CM | POA: Diagnosis not present

## 2015-11-18 DIAGNOSIS — E785 Hyperlipidemia, unspecified: Secondary | ICD-10-CM | POA: Diagnosis not present

## 2015-11-18 DIAGNOSIS — I10 Essential (primary) hypertension: Secondary | ICD-10-CM | POA: Diagnosis not present

## 2015-11-18 DIAGNOSIS — Z599 Problem related to housing and economic circumstances, unspecified: Secondary | ICD-10-CM

## 2015-11-18 DIAGNOSIS — G9619 Other disorders of meninges, not elsewhere classified: Secondary | ICD-10-CM | POA: Diagnosis not present

## 2015-11-18 DIAGNOSIS — C3431 Malignant neoplasm of lower lobe, right bronchus or lung: Secondary | ICD-10-CM | POA: Diagnosis not present

## 2015-11-18 DIAGNOSIS — J449 Chronic obstructive pulmonary disease, unspecified: Secondary | ICD-10-CM | POA: Insufficient documentation

## 2015-11-18 DIAGNOSIS — Z7984 Long term (current) use of oral hypoglycemic drugs: Secondary | ICD-10-CM | POA: Diagnosis not present

## 2015-11-18 DIAGNOSIS — E1140 Type 2 diabetes mellitus with diabetic neuropathy, unspecified: Secondary | ICD-10-CM | POA: Diagnosis not present

## 2015-11-18 DIAGNOSIS — R0602 Shortness of breath: Secondary | ICD-10-CM | POA: Diagnosis not present

## 2015-11-18 DIAGNOSIS — Z79899 Other long term (current) drug therapy: Secondary | ICD-10-CM | POA: Insufficient documentation

## 2015-11-18 DIAGNOSIS — M129 Arthropathy, unspecified: Secondary | ICD-10-CM | POA: Diagnosis not present

## 2015-11-18 DIAGNOSIS — G629 Polyneuropathy, unspecified: Secondary | ICD-10-CM | POA: Diagnosis not present

## 2015-11-18 DIAGNOSIS — C3492 Malignant neoplasm of unspecified part of left bronchus or lung: Secondary | ICD-10-CM | POA: Diagnosis not present

## 2015-11-18 DIAGNOSIS — E119 Type 2 diabetes mellitus without complications: Secondary | ICD-10-CM | POA: Diagnosis not present

## 2015-11-18 DIAGNOSIS — R591 Generalized enlarged lymph nodes: Secondary | ICD-10-CM | POA: Diagnosis not present

## 2015-11-18 LAB — CBC WITH DIFFERENTIAL/PLATELET
BASOS PCT: 1 %
Basophils Absolute: 0 10*3/uL (ref 0–0.1)
EOS ABS: 0.3 10*3/uL (ref 0–0.7)
Eosinophils Relative: 3 %
HCT: 36.5 % (ref 35.0–47.0)
Hemoglobin: 12.4 g/dL (ref 12.0–16.0)
Lymphocytes Relative: 14 %
Lymphs Abs: 1.3 10*3/uL (ref 1.0–3.6)
MCH: 31.2 pg (ref 26.0–34.0)
MCHC: 33.9 g/dL (ref 32.0–36.0)
MCV: 92 fL (ref 80.0–100.0)
MONO ABS: 0.3 10*3/uL (ref 0.2–0.9)
MONOS PCT: 3 %
Neutro Abs: 7.2 10*3/uL — ABNORMAL HIGH (ref 1.4–6.5)
Neutrophils Relative %: 79 %
Platelets: 338 10*3/uL (ref 150–440)
RBC: 3.96 MIL/uL (ref 3.80–5.20)
RDW: 14.2 % (ref 11.5–14.5)
WBC: 9 10*3/uL (ref 3.6–11.0)

## 2015-11-18 LAB — COMPREHENSIVE METABOLIC PANEL
ALBUMIN: 3.3 g/dL — AB (ref 3.5–5.0)
ALK PHOS: 167 U/L — AB (ref 38–126)
ALT: 22 U/L (ref 14–54)
ANION GAP: 13 (ref 5–15)
AST: 20 U/L (ref 15–41)
BILIRUBIN TOTAL: 0.3 mg/dL (ref 0.3–1.2)
BUN: 26 mg/dL — ABNORMAL HIGH (ref 6–20)
CALCIUM: 8.8 mg/dL — AB (ref 8.9–10.3)
CO2: 21 mmol/L — ABNORMAL LOW (ref 22–32)
CREATININE: 1.26 mg/dL — AB (ref 0.44–1.00)
Chloride: 93 mmol/L — ABNORMAL LOW (ref 101–111)
GFR calc Af Amer: 53 mL/min — ABNORMAL LOW (ref >60)
GFR calc non Af Amer: 46 mL/min — ABNORMAL LOW (ref >60)
GLUCOSE: 703 mg/dL — AB (ref 65–99)
Potassium: 4.3 mmol/L (ref 3.5–5.1)
Sodium: 127 mmol/L — ABNORMAL LOW (ref 135–145)
TOTAL PROTEIN: 7.6 g/dL (ref 6.5–8.1)

## 2015-11-18 MED ORDER — ALPRAZOLAM 0.25 MG PO TABS: 0.2500 mg | ORAL_TABLET | Freq: Two times a day (BID) | ORAL | 0 refills | Status: AC

## 2015-11-18 NOTE — Telephone Encounter (Signed)
Patient's blood glucose 703.  MD notified.

## 2015-11-18 NOTE — Assessment & Plan Note (Addendum)
#  Metastatic adeno ca lung to brain- reviewed path; PDL-1 70% expression; will be candidate for 1st line keytruda. Discussed potential immune related side effects. Waiting for molecular testing.   # Elevated Blood sugars- 703. Will decrease the steroids. Recommend increasing Tujeo to 90 mg SQ q day. To call PCP re: checking blood sugars/monitor.   # Mets to brain- cont steroids- on RT.   # Headaches- on dex- recommend 2 mg once a day ; on Keppra.   # follow up in Aug 28th- labs/ keytruda.

## 2015-11-18 NOTE — Progress Notes (Signed)
1450-Penny in main lab called Critical glucose of 681.  Lab re-ran the specimen and rcvd a level of 703 on machine.  Read back process performed with [facility].   Dr. Rogue Bussing informed. Read back process performed with MD 1450

## 2015-11-18 NOTE — Progress Notes (Signed)
[place] OFFICE PROGRESS NOTE  Patient Care Team: Cletis Athens, MD as PCP - General (Unknown Physician Specialty)   SUMMARY OF ONCOLOGIC HISTORY:  Oncology History   #  May 2016- pT2a cN0 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 status post wedge resection [Dr.Oakes; initially found 2013] [1.6cm; visceral pleural invasion];  CT-June 2016- NEG; NO adj chemo [Dr.Pandit]  # July 2017- R hilar LN ~2.5cm;  Brain- 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; [facility]]- METASTATIC ADENO CA [lung primary]; Aug 2017- WBRT   # Aug 2017-PDL-1 70% [high expression]; Start Keytruda   # Post thoracotomy pain   # DM/peripheral neuropathy/ Foot infection/ surgery [2017]     Malignant neoplasm of lower lobe of right lung ([place])   10/02/2014 Initial Diagnosis    Malignant neoplasm of lower lobe of right lung [facility])      Oncology History   #  May 2016- pT2a cN0 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 status post wedge resection [Dr.Oakes; initially found 2013] [1.6cm; visceral pleural invasion];  CT-June 2016- NEG; NO adj chemo [Dr.Pandit]  # July 2017- R hilar LN ~2.5cm;  Brain- 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; [facility]]- METASTATIC ADENO CA [lung primary]; Aug 2017- WBRT   # Aug 2017-PDL-1 70% [high expression]; Start Keytruda   # Post thoracotomy pain   # DM/peripheral neuropathy/ Foot infection/ surgery [2017]     Malignant neoplasm of lower lobe of right lung ([place])   10/02/2014 Initial Diagnosis    Malignant neoplasm of lower lobe of right lung [facility])      INTERVAL HISTORY:   60 year old female patient with above history of Metastatic adenocarcinoma of the lung to the brain/leptomeningeal metastases- currently on whole brain radiation post 3 days is here for follow-up  Patient complains of continued headaches. She does complain of generalized weakness. Patient states she is taking  dexamethasone 4 mg once a day. She does not check her blood sugars at home.  She states her speech is improved. However she continues to have tingling and numbness of the left lower extending the period. Patient is currently using a wheelchair.  Complains of fatigue.   REVIEW OF SYSTEMS:  A complete 10 point review of system is done which is negative except mentioned above/history of present illness. Also has chronic foot infection.  PAST MEDICAL HISTORY :  Past Medical History:  Diagnosis Date  . Arthritis   . Cancer ([place])    Lung CA right wedge removed  . CHF (congestive heart failure) ([place])   . COPD (chronic obstructive pulmonary disease) ([facility])    has oxygen but doesn't use  . Diabetes mellitus type 2 with neurological manifestations ([place])    Peripheral neuropathy and foot ulcers  . Hypertension   . PAD (peripheral artery disease) ([place])   . Peripheral neuropathy ([place])   . Shortness of breath dyspnea     PAST SURGICAL HISTORY :   Past Surgical History:  Procedure Laterality Date  . ABDOMINAL HYSTERECTOMY    . BACK SURGERY  x 7  . CARPAL TUNNEL RELEASE Bilateral   . CESAREAN SECTION     x3  . CORONARY ANGIOPLASTY     no stents  . FOOT SURGERY Left    diabetic ulcer  . KNEE ARTHROSCOPY Bilateral   . TONSILLECTOMY    . VIDEO ASSISTED THORACOSCOPY (VATS)/THOROCOTOMY Right 08/26/2014   Procedure: VIDEO ASSISTED THORACOSCOPY (VATS)/THOROCOTOMY;  Surgeon: Nestor Lewandowsky, MD;  Location: [facility] ORS;  Service: General;  Laterality: Right;    FAMILY HISTORY :   Family History  Problem Relation Age of Onset  . Hypertension Brother   . Coronary artery disease Mother   . Coronary artery disease Father   . Diabetes Mellitus II Brother   . Diabetes Mellitus II Father   . Diabetes Mellitus II Mother     SOCIAL HISTORY:   Social History  Substance Use Topics  . Smoking status: Current Every Day Smoker    Packs/day: 0.50    Years: 46.00    Types: Cigarettes  . Smokeless tobacco:  Never Used     Comment: stopped smoking 08/25/14  . Alcohol use No    ALLERGIES:  is allergic to ampicillin; penicillins; avelox [moxifloxacin hcl in nacl]; metformin; etodolac; and neurontin [gabapentin].  MEDICATIONS:  Current Outpatient Prescriptions  Medication Sig Dispense Refill  . ALPRAZolam (XANAX) 0.25 MG tablet Take 1 tablet (0.25 mg total) by mouth 2 (two) times daily. 60 tablet 0  . budesonide-formoterol (SYMBICORT) 160-4.5 MCG/ACT inhaler Inhale 2 puffs into the lungs 2 (two) times daily. Reported on 05/20/2015    . cyclobenzaprine (FLEXERIL) 10 MG tablet Take 10 mg by mouth 3 (three) times daily.     Marland Kitchen dexamethasone (DECADRON) 4 MG tablet Take 1 tablet (4 mg total) by mouth daily. 30 tablet 0  . diazepam (VALIUM) 5 MG tablet Take 2 mg by mouth daily.     . diphenhydrAMINE (BENADRYL) 25 MG tablet Take 25 mg by mouth at bedtime.    . DULoxetine (CYMBALTA) 60 MG capsule Take 60 mg by mouth daily.    . furosemide (LASIX) 40 MG tablet Take 40 mg by mouth.    Marland Kitchen gemfibrozil (LOPID) 600 MG tablet Take 600 mg by mouth 2 (two) times daily before a meal.    . glipiZIDE (GLUCOTROL) 5 MG tablet Take 5 mg by mouth 2 (two) times daily before a meal.     . ibuprofen (ADVIL,MOTRIN) 100 MG tablet Take 100 mg by mouth every 6 (six) hours as needed for fever.    . Insulin Glargine (TOUJEO SOLOSTAR) 300 UNIT/ML SOPN Inject 50 Units into the skin as directed.    Marland Kitchen LYRICA 150 MG capsule     . metFORMIN (GLUCOPHAGE) 1000 MG tablet Take by mouth.    . oxyCODONE (OXY IR/ROXICODONE) 5 MG immediate release tablet 1-2 tabs every 4 hours as needed for severe pain 100 tablet 0  . pantoprazole (PROTONIX) 40 MG tablet Take by mouth.    . propranolol (INDERAL) 40 MG tablet Take 40 mg by mouth 2 (two) times daily.    . rosuvastatin (CRESTOR) 10 MG tablet Take 10 mg by mouth daily.    Marland Kitchen senna-docusate (SENOKOT-S) 8.6-50 MG tablet Take by mouth.     No current facility-administered medications for this visit.      PHYSICAL EXAMINATION: ECOG PERFORMANCE STATUS: 2 - Symptomatic, <50% confined to bed  BP 115/79 (BP Location: Left Arm, Patient Position: Sitting)   Pulse 88   Temp 97.1 F (36.2 C) (Tympanic)   Resp 18   Ht 5' 5" (1.651 m)   Wt 206 lb (93.4 kg)   BMI 34.28 kg/m   Filed Weights   11/18/15 1537  Weight: 206 lb (93.4 kg)    GENERAL: Well-nourished well-developed; Alert, no distress and comfortable.   In a wheelchair accompanied by her son. EYES: no pallor or icterus OROPHARYNX: no thrush or ulceration  NECK: supple, no masses felt LYMPH:  no  palpable lymphadenopathy in the cervical, axillary or inguinal regions LUNGS: clear to auscultation and  No wheeze or crackles HEART/CVS: regular rate & rhythm and no murmurs; No lower extremity edema ABDOMEN:abdomen soft, non-tender and normal bowel sounds Musculoskeletal:no cyanosis of digits and no clubbing  PSYCH: alert & oriented x 3 with fluent speech NEURO: Mild-to-moderate weakness in the left lower extremity. Difficulty with gait. SKIN:  no rashes or significant lesions  LABORATORY DATA:  I have reviewed the data as listed    Component Value Date/Time   NA 127 (L) 11/18/2015 1417   NA 134 (L) 07/24/2014 1215   K 4.3 11/18/2015 1417   K 4.2 07/24/2014 1215   CL 93 (L) 11/18/2015 1417   CL 100 (L) 07/24/2014 1215   CO2 21 (L) 11/18/2015 1417   CO2 25 07/24/2014 1215   GLUCOSE 703 (HH) 11/18/2015 1417   GLUCOSE 213 (H) 07/24/2014 1215   BUN 26 (H) 11/18/2015 1417   BUN 26 (H) 07/24/2014 1215   CREATININE 1.26 (H) 11/18/2015 1417   CREATININE 0.71 07/24/2014 1215   CALCIUM 8.8 (L) 11/18/2015 1417   CALCIUM 9.8 07/24/2014 1215   PROT 7.6 11/18/2015 1417   PROT 8.7 (H) 07/24/2014 1215   ALBUMIN 3.3 (L) 11/18/2015 1417   ALBUMIN 4.5 07/24/2014 1215   AST 20 11/18/2015 1417   AST 39 07/24/2014 1215   ALT 22 11/18/2015 1417   ALT 37 07/24/2014 1215   ALKPHOS 167 (H) 11/18/2015 1417   ALKPHOS 159 (H) 07/24/2014 1215    BILITOT 0.3 11/18/2015 1417   BILITOT 0.5 07/24/2014 1215   GFRNONAA 46 (L) 11/18/2015 1417   GFRNONAA >60 07/24/2014 1215   GFRAA 53 (L) 11/18/2015 1417   GFRAA >60 07/24/2014 1215    No results found for: SPEP, UPEP  Lab Results  Component Value Date   WBC 9.0 11/18/2015   NEUTROABS 7.2 (H) 11/18/2015   HGB 12.4 11/18/2015   HCT 36.5 11/18/2015   MCV 92.0 11/18/2015   PLT 338 11/18/2015      Chemistry      Component Value Date/Time   NA 127 (L) 11/18/2015 1417   NA 134 (L) 07/24/2014 1215   K 4.3 11/18/2015 1417   K 4.2 07/24/2014 1215   CL 93 (L) 11/18/2015 1417   CL 100 (L) 07/24/2014 1215   CO2 21 (L) 11/18/2015 1417   CO2 25 07/24/2014 1215   BUN 26 (H) 11/18/2015 1417   BUN 26 (H) 07/24/2014 1215   CREATININE 1.26 (H) 11/18/2015 1417   CREATININE 0.71 07/24/2014 1215      Component Value Date/Time   CALCIUM 8.8 (L) 11/18/2015 1417   CALCIUM 9.8 07/24/2014 1215   ALKPHOS 167 (H) 11/18/2015 1417   ALKPHOS 159 (H) 07/24/2014 1215   AST 20 11/18/2015 1417   AST 39 07/24/2014 1215   ALT 22 11/18/2015 1417   ALT 37 07/24/2014 1215   BILITOT 0.3 11/18/2015 1417   BILITOT 0.5 07/24/2014 1215       RADIOGRAPHIC STUDIES: I have personally reviewed the radiological images as listed and agreed with the findings in the report. No results found.   ASSESSMENT & PLAN:   Malignant neoplasm of lower lobe of right lung ([place]) # Metastatic adeno ca lung to brain- reviewed path; PDL-1 70% expression; will be candidate for 1st line keytruda. Discussed potential immune related side effects. Waiting for molecular testing.   # Elevated Blood sugars- 703. Will decrease the steroids. Recommend increasing Tujeo to  90 mg SQ q day. To call PCP re: checking blood sugars/monitor.   # Mets to brain- cont steroids- on RT.   # Headaches- on dex- recommend 2 mg once a day ; on Keppra.   # follow up in Aug 28th- labs/ keytruda.   # Check weekly CBC BMP.  # 25 minutes  face-to-face with the patient discussing the above plan of care; more than 50% of time spent on prognosis/ natural history; counseling and coordination. Overall poor prognosis.    Cammie Sickle, MD 11/18/2015 5:26 PM

## 2015-11-18 NOTE — Progress Notes (Signed)
Patient states she has a headache all the time.  She is not sleeping well.  Requesting refill for Xanax.

## 2015-11-19 ENCOUNTER — Telehealth: Payer: Self-pay | Admitting: *Deleted

## 2015-11-19 ENCOUNTER — Ambulatory Visit
Admission: RE | Admit: 2015-11-19 | Discharge: 2015-11-19 | Disposition: A | Discharge status: Home / Self Care | Payer: Medicare Other | Source: Ambulatory Visit | Attending: Radiation Oncology | Admitting: Radiation Oncology

## 2015-11-19 ENCOUNTER — Other Ambulatory Visit: Payer: Self-pay | Admitting: *Deleted

## 2015-11-19 DIAGNOSIS — E1140 Type 2 diabetes mellitus with diabetic neuropathy, unspecified: Secondary | ICD-10-CM | POA: Diagnosis not present

## 2015-11-19 DIAGNOSIS — Z791 Long term (current) use of non-steroidal anti-inflammatories (NSAID): Secondary | ICD-10-CM | POA: Diagnosis not present

## 2015-11-19 DIAGNOSIS — C3492 Malignant neoplasm of unspecified part of left bronchus or lung: Secondary | ICD-10-CM | POA: Diagnosis not present

## 2015-11-19 DIAGNOSIS — I739 Peripheral vascular disease, unspecified: Secondary | ICD-10-CM | POA: Diagnosis not present

## 2015-11-19 DIAGNOSIS — Z79891 Long term (current) use of opiate analgesic: Secondary | ICD-10-CM | POA: Diagnosis not present

## 2015-11-19 DIAGNOSIS — K769 Liver disease, unspecified: Secondary | ICD-10-CM | POA: Diagnosis not present

## 2015-11-19 DIAGNOSIS — Z510 Encounter for antineoplastic radiation therapy: Secondary | ICD-10-CM | POA: Diagnosis not present

## 2015-11-19 DIAGNOSIS — Z483 Aftercare following surgery for neoplasm: Secondary | ICD-10-CM | POA: Diagnosis not present

## 2015-11-19 DIAGNOSIS — E785 Hyperlipidemia, unspecified: Secondary | ICD-10-CM | POA: Diagnosis not present

## 2015-11-19 DIAGNOSIS — E1165 Type 2 diabetes mellitus with hyperglycemia: Secondary | ICD-10-CM | POA: Diagnosis not present

## 2015-11-19 DIAGNOSIS — E119 Type 2 diabetes mellitus without complications: Secondary | ICD-10-CM | POA: Diagnosis not present

## 2015-11-19 DIAGNOSIS — Z79899 Other long term (current) drug therapy: Secondary | ICD-10-CM | POA: Diagnosis not present

## 2015-11-19 DIAGNOSIS — I10 Essential (primary) hypertension: Secondary | ICD-10-CM | POA: Diagnosis not present

## 2015-11-19 DIAGNOSIS — R918 Other nonspecific abnormal finding of lung field: Secondary | ICD-10-CM | POA: Diagnosis not present

## 2015-11-19 DIAGNOSIS — R112 Nausea with vomiting, unspecified: Secondary | ICD-10-CM

## 2015-11-19 DIAGNOSIS — I509 Heart failure, unspecified: Secondary | ICD-10-CM | POA: Diagnosis not present

## 2015-11-19 DIAGNOSIS — C3431 Malignant neoplasm of lower lobe, right bronchus or lung: Secondary | ICD-10-CM | POA: Diagnosis not present

## 2015-11-19 DIAGNOSIS — Z7984 Long term (current) use of oral hypoglycemic drugs: Secondary | ICD-10-CM | POA: Diagnosis not present

## 2015-11-19 DIAGNOSIS — Z794 Long term (current) use of insulin: Secondary | ICD-10-CM | POA: Diagnosis not present

## 2015-11-19 DIAGNOSIS — G9619 Other disorders of meninges, not elsewhere classified: Secondary | ICD-10-CM | POA: Diagnosis not present

## 2015-11-19 DIAGNOSIS — J449 Chronic obstructive pulmonary disease, unspecified: Secondary | ICD-10-CM | POA: Diagnosis not present

## 2015-11-19 DIAGNOSIS — R591 Generalized enlarged lymph nodes: Secondary | ICD-10-CM | POA: Diagnosis not present

## 2015-11-19 DIAGNOSIS — C7931 Secondary malignant neoplasm of brain: Secondary | ICD-10-CM | POA: Diagnosis not present

## 2015-11-19 DIAGNOSIS — M129 Arthropathy, unspecified: Secondary | ICD-10-CM | POA: Diagnosis not present

## 2015-11-19 DIAGNOSIS — R0602 Shortness of breath: Secondary | ICD-10-CM | POA: Diagnosis not present

## 2015-11-19 DIAGNOSIS — G629 Polyneuropathy, unspecified: Secondary | ICD-10-CM | POA: Diagnosis not present

## 2015-11-19 DIAGNOSIS — D432 Neoplasm of uncertain behavior of brain, unspecified: Secondary | ICD-10-CM | POA: Diagnosis not present

## 2015-11-19 MED ORDER — PROMETHAZINE HCL 12.5 MG PO TABS: 12.5000 mg | ORAL_TABLET | Freq: Four times a day (QID) | ORAL | 4 refills | Status: DC | PRN

## 2015-11-19 NOTE — Progress Notes (Unsigned)
[facility] will provide patient with a free glucometer.  The charitable funds will purchase 5 boxes of test strips for the patient.  Patient notified to pick up items at pharmacy today.

## 2015-11-19 NOTE — Telephone Encounter (Signed)
Pt requesting RF on phenergan 12.5 mg. This was sent to the pharmacy per direction of Dr. Rogue Bussing.  Pt preferred phenergan due to cost of drug and declined oral zofran.

## 2015-11-20 ENCOUNTER — Ambulatory Visit
Admission: RE | Admit: 2015-11-20 | Discharge: 2015-11-20 | Disposition: A | Discharge status: Home / Self Care | Payer: Medicare Other | Source: Ambulatory Visit | Attending: Radiation Oncology | Admitting: Radiation Oncology

## 2015-11-20 ENCOUNTER — Other Ambulatory Visit: Payer: Self-pay | Admitting: *Deleted

## 2015-11-20 DIAGNOSIS — C7931 Secondary malignant neoplasm of brain: Secondary | ICD-10-CM | POA: Diagnosis not present

## 2015-11-20 DIAGNOSIS — I10 Essential (primary) hypertension: Secondary | ICD-10-CM | POA: Diagnosis not present

## 2015-11-20 DIAGNOSIS — I509 Heart failure, unspecified: Secondary | ICD-10-CM | POA: Diagnosis not present

## 2015-11-20 DIAGNOSIS — Z510 Encounter for antineoplastic radiation therapy: Secondary | ICD-10-CM | POA: Diagnosis not present

## 2015-11-20 DIAGNOSIS — Z7984 Long term (current) use of oral hypoglycemic drugs: Secondary | ICD-10-CM | POA: Diagnosis not present

## 2015-11-20 DIAGNOSIS — G629 Polyneuropathy, unspecified: Secondary | ICD-10-CM | POA: Diagnosis not present

## 2015-11-20 DIAGNOSIS — E119 Type 2 diabetes mellitus without complications: Secondary | ICD-10-CM | POA: Diagnosis not present

## 2015-11-20 DIAGNOSIS — E1140 Type 2 diabetes mellitus with diabetic neuropathy, unspecified: Secondary | ICD-10-CM | POA: Diagnosis not present

## 2015-11-20 DIAGNOSIS — J449 Chronic obstructive pulmonary disease, unspecified: Secondary | ICD-10-CM | POA: Diagnosis not present

## 2015-11-20 DIAGNOSIS — G9619 Other disorders of meninges, not elsewhere classified: Secondary | ICD-10-CM | POA: Diagnosis not present

## 2015-11-20 DIAGNOSIS — I739 Peripheral vascular disease, unspecified: Secondary | ICD-10-CM | POA: Diagnosis not present

## 2015-11-20 DIAGNOSIS — Z79899 Other long term (current) drug therapy: Secondary | ICD-10-CM | POA: Diagnosis not present

## 2015-11-20 DIAGNOSIS — M129 Arthropathy, unspecified: Secondary | ICD-10-CM | POA: Diagnosis not present

## 2015-11-20 DIAGNOSIS — C3492 Malignant neoplasm of unspecified part of left bronchus or lung: Secondary | ICD-10-CM | POA: Diagnosis not present

## 2015-11-20 DIAGNOSIS — R0602 Shortness of breath: Secondary | ICD-10-CM | POA: Diagnosis not present

## 2015-11-20 DIAGNOSIS — Z794 Long term (current) use of insulin: Secondary | ICD-10-CM | POA: Diagnosis not present

## 2015-11-20 DIAGNOSIS — R918 Other nonspecific abnormal finding of lung field: Secondary | ICD-10-CM | POA: Diagnosis not present

## 2015-11-20 DIAGNOSIS — R591 Generalized enlarged lymph nodes: Secondary | ICD-10-CM | POA: Diagnosis not present

## 2015-11-23 ENCOUNTER — Ambulatory Visit: Payer: Medicare Other

## 2015-11-24 ENCOUNTER — Emergency Department: Payer: Medicare Other

## 2015-11-24 ENCOUNTER — Ambulatory Visit
Admission: RE | Admit: 2015-11-24 | Discharge: 2015-11-24 | Disposition: A | Discharge status: Home / Self Care | Payer: Medicare Other | Source: Ambulatory Visit | Attending: Radiation Oncology | Admitting: Radiation Oncology

## 2015-11-24 ENCOUNTER — Emergency Department
Admission: EM | Admit: 2015-11-24 | Discharge: 2015-11-24 | Disposition: A | Discharge status: Home / Self Care | Payer: Medicare Other | Attending: Emergency Medicine | Admitting: Emergency Medicine

## 2015-11-24 ENCOUNTER — Other Ambulatory Visit: Payer: Self-pay | Admitting: *Deleted

## 2015-11-24 ENCOUNTER — Encounter: Payer: Self-pay | Admitting: Emergency Medicine

## 2015-11-24 DIAGNOSIS — G9619 Other disorders of meninges, not elsewhere classified: Secondary | ICD-10-CM | POA: Diagnosis not present

## 2015-11-24 DIAGNOSIS — F17210 Nicotine dependence, cigarettes, uncomplicated: Secondary | ICD-10-CM | POA: Insufficient documentation

## 2015-11-24 DIAGNOSIS — C3431 Malignant neoplasm of lower lobe, right bronchus or lung: Secondary | ICD-10-CM

## 2015-11-24 DIAGNOSIS — E1140 Type 2 diabetes mellitus with diabetic neuropathy, unspecified: Secondary | ICD-10-CM | POA: Diagnosis not present

## 2015-11-24 DIAGNOSIS — Z79899 Other long term (current) drug therapy: Secondary | ICD-10-CM | POA: Diagnosis not present

## 2015-11-24 DIAGNOSIS — Z7984 Long term (current) use of oral hypoglycemic drugs: Secondary | ICD-10-CM | POA: Insufficient documentation

## 2015-11-24 DIAGNOSIS — L97529 Non-pressure chronic ulcer of other part of left foot with unspecified severity: Secondary | ICD-10-CM

## 2015-11-24 DIAGNOSIS — Z85841 Personal history of malignant neoplasm of brain: Secondary | ICD-10-CM | POA: Diagnosis not present

## 2015-11-24 DIAGNOSIS — C3492 Malignant neoplasm of unspecified part of left bronchus or lung: Secondary | ICD-10-CM | POA: Diagnosis not present

## 2015-11-24 DIAGNOSIS — M129 Arthropathy, unspecified: Secondary | ICD-10-CM | POA: Diagnosis not present

## 2015-11-24 DIAGNOSIS — S91302A Unspecified open wound, left foot, initial encounter: Secondary | ICD-10-CM | POA: Diagnosis not present

## 2015-11-24 DIAGNOSIS — Z9861 Coronary angioplasty status: Secondary | ICD-10-CM | POA: Insufficient documentation

## 2015-11-24 DIAGNOSIS — E119 Type 2 diabetes mellitus without complications: Secondary | ICD-10-CM | POA: Diagnosis not present

## 2015-11-24 DIAGNOSIS — Z794 Long term (current) use of insulin: Secondary | ICD-10-CM | POA: Diagnosis not present

## 2015-11-24 DIAGNOSIS — E11621 Type 2 diabetes mellitus with foot ulcer: Secondary | ICD-10-CM | POA: Diagnosis not present

## 2015-11-24 DIAGNOSIS — C7931 Secondary malignant neoplasm of brain: Secondary | ICD-10-CM

## 2015-11-24 DIAGNOSIS — I739 Peripheral vascular disease, unspecified: Secondary | ICD-10-CM | POA: Diagnosis not present

## 2015-11-24 DIAGNOSIS — J449 Chronic obstructive pulmonary disease, unspecified: Secondary | ICD-10-CM | POA: Insufficient documentation

## 2015-11-24 DIAGNOSIS — L03116 Cellulitis of left lower limb: Secondary | ICD-10-CM | POA: Diagnosis not present

## 2015-11-24 DIAGNOSIS — M79672 Pain in left foot: Secondary | ICD-10-CM | POA: Diagnosis present

## 2015-11-24 DIAGNOSIS — Z85118 Personal history of other malignant neoplasm of bronchus and lung: Secondary | ICD-10-CM | POA: Insufficient documentation

## 2015-11-24 DIAGNOSIS — R0602 Shortness of breath: Secondary | ICD-10-CM | POA: Diagnosis not present

## 2015-11-24 DIAGNOSIS — G629 Polyneuropathy, unspecified: Secondary | ICD-10-CM | POA: Diagnosis not present

## 2015-11-24 DIAGNOSIS — L97429 Non-pressure chronic ulcer of left heel and midfoot with unspecified severity: Secondary | ICD-10-CM | POA: Diagnosis not present

## 2015-11-24 DIAGNOSIS — I509 Heart failure, unspecified: Secondary | ICD-10-CM | POA: Insufficient documentation

## 2015-11-24 DIAGNOSIS — Z510 Encounter for antineoplastic radiation therapy: Secondary | ICD-10-CM | POA: Diagnosis not present

## 2015-11-24 DIAGNOSIS — R591 Generalized enlarged lymph nodes: Secondary | ICD-10-CM | POA: Diagnosis not present

## 2015-11-24 DIAGNOSIS — I110 Hypertensive heart disease with heart failure: Secondary | ICD-10-CM | POA: Insufficient documentation

## 2015-11-24 DIAGNOSIS — I10 Essential (primary) hypertension: Secondary | ICD-10-CM | POA: Diagnosis not present

## 2015-11-24 DIAGNOSIS — Z791 Long term (current) use of non-steroidal anti-inflammatories (NSAID): Secondary | ICD-10-CM | POA: Insufficient documentation

## 2015-11-24 DIAGNOSIS — R918 Other nonspecific abnormal finding of lung field: Secondary | ICD-10-CM | POA: Diagnosis not present

## 2015-11-24 LAB — URINALYSIS COMPLETE WITH MICROSCOPIC (ARMC ONLY)
BACTERIA UA: NONE SEEN
BILIRUBIN URINE: NEGATIVE
Glucose, UA: >500 mg/dL — AB
HGB URINE DIPSTICK: NEGATIVE
Ketones, ur: NEGATIVE mg/dL
LEUKOCYTES UA: NEGATIVE
NITRITE: NEGATIVE
Protein, ur: NEGATIVE mg/dL
Specific Gravity, Urine: 1.018 (ref 1.005–1.030)
pH: 5 (ref 5.0–8.0)

## 2015-11-24 LAB — CBC
HEMATOCRIT: 35.1 % (ref 35.0–47.0)
HEMOGLOBIN: 11.7 g/dL — AB (ref 12.0–16.0)
MCH: 30.4 pg (ref 26.0–34.0)
MCHC: 33.2 g/dL (ref 32.0–36.0)
MCV: 91.6 fL (ref 80.0–100.0)
Platelets: 257 10*3/uL (ref 150–440)
RBC: 3.83 MIL/uL (ref 3.80–5.20)
RDW: 14.1 % (ref 11.5–14.5)
WBC: 9.7 10*3/uL (ref 3.6–11.0)

## 2015-11-24 LAB — GLUCOSE, CAPILLARY: Glucose-Capillary: 197 mg/dL — ABNORMAL HIGH (ref 65–99)

## 2015-11-24 LAB — BASIC METABOLIC PANEL
ANION GAP: 13 (ref 5–15)
BUN: 14 mg/dL (ref 6–20)
CO2: 22 mmol/L (ref 22–32)
Calcium: 9.4 mg/dL (ref 8.9–10.3)
Chloride: 97 mmol/L — ABNORMAL LOW (ref 101–111)
Creatinine, Ser: 0.78 mg/dL (ref 0.44–1.00)
Glucose, Bld: 369 mg/dL — ABNORMAL HIGH (ref 65–99)
POTASSIUM: 4.4 mmol/L (ref 3.5–5.1)
SODIUM: 132 mmol/L — AB (ref 135–145)

## 2015-11-24 MED ORDER — CLINDAMYCIN HCL 300 MG PO CAPS: 300.0000 mg | ORAL_CAPSULE | Freq: Three times a day (TID) | ORAL | 0 refills | Status: DC

## 2015-11-24 MED ORDER — OXYCODONE-ACETAMINOPHEN 5-325 MG PO TABS
ORAL_TABLET | ORAL | Status: AC
  Administered 2015-11-24: 2 via ORAL
  Filled 2015-11-24: qty 2

## 2015-11-24 MED ORDER — INSULIN ASPART 100 UNIT/ML ~~LOC~~ SOLN
4.0000 [IU] | Freq: Once | SUBCUTANEOUS | Status: AC
  Administered 2015-11-24: 4 [IU] via INTRAVENOUS
  Filled 2015-11-24: qty 4

## 2015-11-24 MED ORDER — BACITRACIN ZINC 500 UNIT/GM EX OINT
TOPICAL_OINTMENT | CUTANEOUS | Status: AC
  Administered 2015-11-24: 1 via TOPICAL
  Filled 2015-11-24: qty 0.9

## 2015-11-24 MED ORDER — BACITRACIN ZINC 500 UNIT/GM EX OINT
TOPICAL_OINTMENT | CUTANEOUS | Status: AC
  Administered 2015-11-24: 1 via TOPICAL

## 2015-11-24 MED ORDER — SODIUM CHLORIDE 0.9 % IV BOLUS (SEPSIS)
1000.0000 mL | Freq: Once | INTRAVENOUS | Status: AC
  Administered 2015-11-24: 1000 mL via INTRAVENOUS

## 2015-11-24 MED ORDER — OXYCODONE-ACETAMINOPHEN 5-325 MG PO TABS
2.0000 | ORAL_TABLET | ORAL | Status: AC
  Administered 2015-11-24: 2 via ORAL

## 2015-11-24 MED ORDER — CLINDAMYCIN PHOSPHATE 600 MG/50ML IV SOLN
600.0000 mg | Freq: Once | INTRAVENOUS | Status: AC
  Administered 2015-11-24: 600 mg via INTRAVENOUS
  Filled 2015-11-24: qty 50

## 2015-11-24 NOTE — ED Notes (Signed)
Pt alert and oriented X4, active, cooperative, pt in NAD. RR even and unlabored, color WNL.  Pt informed to return if any life threatening symptoms occur.   

## 2015-11-24 NOTE — ED Triage Notes (Signed)
Patient presents to the ED with an open wound and a closed wound on her left foot.  Patient states her foot has been hurting but she stated that when she took her sock off the skin came off her foot with her sock.  Wound is to the right side (great toe side) of her left foot.  Patient has diabetes but states her blood sugar has not been under control because she is taking steroids for brain cancer (stage 4).  Patient is alert and oriented x 4 at this time.

## 2015-11-24 NOTE — Telephone Encounter (Signed)
Called to request refill on her Oxycodone 5 mg, she last got #100 tabs on 8/2.  Of note, I spoke with Ana in XRT who said she has been sent to ER for a wound infection in her foot (possible diabetic ulcer) which was covered in dog hair and had a red streak running up her leg.  Please advise

## 2015-11-24 NOTE — Discharge Instructions (Signed)
You have been seen today in the Emergency Department (ED)  for cellulitis, a superficial skin infection. Please take your antibiotics as prescribed for their ENTIRE prescribed duration.    The doctor has checked you carefully, but problems can develop later. If you notice any problems or new symptoms, get medical treatment right away.   Follow-up with your doctor in 24-48 hours for a re-check. If you noticed that redness is expanding, if you develop a fever, or if your symptoms are worsening follow up with your doctor immediately or return to the ER as you may need different antibiotic.    When should you call for help?  Call your doctor now or seek immediate medical care if:  You have signs that your infection is getting worse, such as:  Increased pain, swelling, warmth, or redness.  Red streaks leading from the area.  Pus draining from the area.  A fever. You develop a rash  Watch closely for changes in your health, and be sure to contact your doctor if:  You are not getting better after 1 day (24 hours).  You do not get better as expected.  How can you care for yourself at home?  Take your antibiotics as directed. Do not stop taking them just because you feel better. You need to take the full course of antibiotics.  Prop up the infected area on pillows to reduce pain and swelling. Try to keep the area above the level of your heart as often as you can.  Keep the skin clean and dry. Change your bandages as your doctor recommended.  Take pain medicines exactly as directed.  If the doctor gave you a prescription medicine for pain, take it as prescribed.  If you are not taking a prescription pain medicine, ask your doctor if you can take an over-the-counter medicine.  To prevent cellulitis in the future  Try to prevent cuts, scrapes, or other injuries to your skin. Cellulitis most often occurs where there is a break in the skin.  If you get a scrape, cut, mild burn, or bite, clean the  wound with soap and water as soon as you can to help avoid infection. Don't use hydrogen peroxide or alcohol, which can slow healing.  You may cover the wound with a thin layer of antibiotic ointment, such as bacitracin, and a nonstick bandage.  Apply more ointment and replace the bandage as needed. If you have swelling in your legs (edema), support stockings and good skin care may help prevent leg sores and cellulitis.  Take care of your feet, especially if you have diabetes or other conditions that increase the risk of infection. Wear shoes and socks. Do not go barefoot. If you have athlete's foot or other skin problems on your feet, talk to your doctor about how to treat them.

## 2015-11-24 NOTE — ED Notes (Signed)
Left foot wrapped

## 2015-11-24 NOTE — ED Provider Notes (Signed)
[facility] Emergency Department Provider Note  ____________________________________________  Time seen: Approximately 5:08 PM  I have reviewed the triage vital signs and the nursing notes.   HISTORY  Chief Complaint Open Wound and Foot Pain   HPI Marie [name] is a 60 y.o. 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 Patient reports that she took her socks off yesterday and the skin came off. She has a 3 cm round ulcer located in the bottom of her left foot overlying her first distal metacarpal bone. The ulceration is shallow, she denies purulent discharge, fever, vomiting. She does endorse some nausea today. She endorses that the pain is moderate laying flat but when she tries to walk on it and the pain is severe. Currently the pain is 9/10, sharp, located in the bottom of her foot, nonradiating. She usually takes oxycodone at home for the pain however she didn't take any today. She also reports that her sugars have been running high that this is because she is on Decadron for her brain tumor.  Past Medical History:  Diagnosis Date  . Arthritis   . Cancer ([place])    Lung CA right wedge removed  . CHF (congestive heart failure) ([place])   . COPD (chronic obstructive pulmonary disease) ([facility])    has oxygen but doesn't use  . Diabetes mellitus type 2 with neurological manifestations ([place])    Peripheral neuropathy and foot ulcers  . Hypertension   . PAD (peripheral artery disease) ([place])   . Peripheral neuropathy ([place])   . Shortness of breath dyspnea     Patient Active Problem List   Diagnosis Date Noted  . Poorly controlled diabetes mellitus ([place]) 11/18/2015  . Brain metastasis ([place]) 11/04/2015  . Osteomyelitis ([place]) 11/01/2014  . Malignant neoplasm of lower lobe of  right lung ([place]) 10/02/2014    Past Surgical History:  Procedure Laterality Date  . ABDOMINAL HYSTERECTOMY    . BACK SURGERY  x 7  . CARPAL TUNNEL RELEASE Bilateral   . CESAREAN SECTION     x3  . CORONARY ANGIOPLASTY     no stents  . FOOT SURGERY Left    diabetic ulcer  . KNEE ARTHROSCOPY Bilateral   . TONSILLECTOMY    . VIDEO ASSISTED THORACOSCOPY (VATS)/THOROCOTOMY Right 08/26/2014   Procedure: VIDEO ASSISTED THORACOSCOPY (VATS)/THOROCOTOMY;  Surgeon: Nestor Lewandowsky, MD;  Location: [facility] ORS;  Service: General;  Laterality: Right;    Prior to Admission medications   Medication Sig Start Date End Date Taking? Authorizing Provider  ALPRAZolam (XANAX) 0.25 MG tablet Take 1 tablet (0.25 mg total) by mouth 2 (two) times daily. 11/18/15   Cammie Sickle, MD  budesonide-formoterol (SYMBICORT) 160-4.5 MCG/ACT inhaler Inhale 2 puffs into the lungs 2 (two) times daily. Reported on 05/20/2015    Historical Provider, MD  cyclobenzaprine (FLEXERIL) 10 MG tablet Take 10 mg by mouth 3 (three) times daily.     Historical Provider, MD  dexamethasone (DECADRON) 4 MG tablet Take 1 tablet (4 mg total) by mouth daily. 11/12/15   Noreene Filbert, MD  diazepam (VALIUM) 5 MG tablet Take 2 mg by mouth daily.  08/15/14   Historical Provider, MD  diphenhydrAMINE (BENADRYL) 25 MG tablet Take 25 mg by mouth at bedtime.    Historical Provider, MD  DULoxetine (CYMBALTA) 60 MG capsule Take 60  mg by mouth daily.    Historical Provider, MD  furosemide (LASIX) 40 MG tablet Take 40 mg by mouth.    Historical Provider, MD  gemfibrozil (LOPID) 600 MG tablet Take 600 mg by mouth 2 (two) times daily before a meal.    Historical Provider, MD  glipiZIDE (GLUCOTROL) 5 MG tablet Take 5 mg by mouth 2 (two) times daily before a meal.  06/26/14   Historical Provider, MD  ibuprofen (ADVIL,MOTRIN) 100 MG tablet Take 100 mg by mouth every 6 (six) hours as needed for fever.    Historical Provider, MD  Insulin Glargine (TOUJEO SOLOSTAR) 300  UNIT/ML SOPN Inject 50 Units into the skin as directed.    Historical Provider, MD  LYRICA 150 MG capsule  10/29/15   Historical Provider, MD  metFORMIN (GLUCOPHAGE) 1000 MG tablet Take by mouth. 05/19/15   Historical Provider, MD  oxyCODONE (OXY IR/ROXICODONE) 5 MG immediate release tablet 1-2 tabs every 4 hours as needed for severe pain 11/11/15   Cammie Sickle, MD  pantoprazole (PROTONIX) 40 MG tablet Take by mouth. 10/28/15 10/27/16  Historical Provider, MD  promethazine (PHENERGAN) 12.5 MG tablet Take 1 tablet (12.5 mg total) by mouth every 6 (six) hours as needed for nausea or vomiting. 11/19/15   Cammie Sickle, MD  propranolol (INDERAL) 40 MG tablet Take 40 mg by mouth 2 (two) times daily.    Historical Provider, MD  rosuvastatin (CRESTOR) 10 MG tablet Take 10 mg by mouth daily.    Historical Provider, MD  senna-docusate (SENOKOT-S) 8.6-50 MG tablet Take by mouth. 10/28/15 10/27/16  Historical Provider, MD    Allergies Ampicillin; Penicillins; Avelox [moxifloxacin hcl in nacl]; Metformin; Etodolac; and Neurontin [gabapentin]  Family History  Problem Relation Age of Onset  . Hypertension Brother   . Coronary artery disease Mother   . Diabetes Mellitus II Mother   . Coronary artery disease Father   . Diabetes Mellitus II Father   . Diabetes Mellitus II Brother     Social History Social History  Substance Use Topics  . Smoking status: Current Every Day Smoker    Packs/day: 0.50    Years: 46.00    Types: Cigarettes  . Smokeless tobacco: Never Used     Comment: stopped smoking 08/25/14  . Alcohol use No    Review of Systems  Constitutional: Negative for fever. Eyes: Negative for visual changes. ENT: Negative for sore throat. Cardiovascular: Negative for chest pain. Respiratory: Negative for shortness of breath. Gastrointestinal: Negative for abdominal pain, vomiting or diarrhea. Genitourinary: Negative for dysuria. Musculoskeletal: Negative for back pain. + L foot  ulcer Skin: Negative for rash. Neurological: Negative for headaches, weakness or numbness.  ____________________________________________   PHYSICAL EXAM:  VITAL SIGNS: ED Triage Vitals  Enc Vitals Group     BP 11/24/15 1507 102/63     Pulse Rate 11/24/15 1507 76     Resp 11/24/15 1507 18     Temp 11/24/15 1507 97.7 F (36.5 C)     Temp Source 11/24/15 1507 Oral     SpO2 11/24/15 1507 96 %     Weight 11/24/15 1508 198 lb (89.8 kg)     Height 11/24/15 1508 '5\' 5"'$  (1.651 m)     Head Circumference --      Peak Flow --      Pain Score 11/24/15 1508 9     Pain Loc --      Pain Edu? --      Excl. in [place]? --  Constitutional: Alert and oriented. Well appearing and in no apparent distress. HEENT:      Head: Normocephalic and atraumatic.         Eyes: Conjunctivae are normal. Sclera is non-icteric. EOMI. PERRL      Mouth/Throat: Mucous membranes are moist.       Neck: Supple with no signs of meningismus. Cardiovascular: Regular rate and rhythm. No murmurs, gallops, or rubs. 2+ symmetrical distal pulses are present in all extremities. No JVD. Respiratory: Normal respiratory effort. Lungs are clear to auscultation bilaterally. No wheezes, crackles, or rhonchi.  Gastrointestinal: Soft, non tender, and non distended with positive bowel sounds. No rebound or guarding. Genitourinary: No CVA tenderness. Musculoskeletal: Nontender with normal range of motion in all extremities. No edema, cyanosis, or erythema of extremities. Neurologic: Normal speech and language. Face is symmetric. Moving all extremities. No gross focal neurologic deficits are appreciated. Skin: There is a shallow 3 cm round ulceration located in the bottom of her left foot overlying the first metatarsal, no purulent discharge, no fluctuance, she has a small amount of cellulitis around the area.  Psychiatric: Mood and affect are normal. Speech and behavior are normal.  ____________________________________________    LABS (all labs ordered are listed, but only abnormal results are displayed)  Labs Reviewed  BASIC METABOLIC PANEL - Abnormal; Notable for the following:       Result Value   Sodium 132 (*)    Chloride 97 (*)    Glucose, Bld 369 (*)    All other components within normal limits  CBC - Abnormal; Notable for the following:    Hemoglobin 11.7 (*)    All other components within normal limits  URINALYSIS COMPLETEWITH MICROSCOPIC ([facility] ONLY) - Abnormal; Notable for the following:    Color, Urine YELLOW (*)    APPearance CLEAR (*)    Glucose, UA >500 (*)    Squamous Epithelial / LPF 0-5 (*)    All other components within normal limits  CBG MONITORING, ED   ____________________________________________  EKG  none ____________________________________________  RADIOLOGY  XR: Some mild progressive loss at the base of the second proximal phalanx. Associated soft tissue wound is noted although no definitive bony destruction is seen. ____________________________________________   PROCEDURES  Procedure(s) performed: None Procedures Critical Care performed:  None ____________________________________________   INITIAL IMPRESSION / ASSESSMENT AND PLAN / ED COURSE  60 y.o. 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 Patient has no fever, her white count here is normal. She has a shallow ulceration on the bottom of her foot with small amount of surrounding cellulitis, no polyps, no abscess, x-ray with no evidence of osteo. We'll give IV clindamycin and discharged home on that medicine and refer patient to the wound clinic for wound care. Her glucose is mildly elevated at 398 so we'll give her fluids and 4 units of insulin. Patient is currently on Decadron which is causing her sugars  to be elevated.  Clinical Course  Comment By Time  Repeat BG 197. Patient remains HD stable and afebrile. Given clindamycin IV and will be dc home on clinda '300mg'$  TID for 10 days and close f/u at the wound clinic. Discuss return precautions with patient for signs or symptoms of worsening infection.  I discussed my evaluation of the patient's symptoms, my clinical impression, and my proposed outpatient  treatment plan with patient. We have discussed anticipatory guidance, scheduled follow-up, and careful return precautions. The patient expresses understanding and is comfortable with the discharge plan. All patient's questions were answered.  Rudene Re, MD 08/15 1836    Pertinent labs & imaging results that were available during my care of the patient were reviewed by me and considered in my medical decision making (see chart for details).    ____________________________________________   FINAL CLINICAL IMPRESSION(S) / ED DIAGNOSES  Final diagnoses:  None      NEW MEDICATIONS STARTED DURING THIS VISIT:  New Prescriptions   No medications on file     Note:  This document was prepared using Dragon voice recognition software and may include unintentional dictation errors.    Rudene Re, MD 11/24/15 [phone]

## 2015-11-24 NOTE — ED Notes (Signed)
MD at bedside. 

## 2015-11-24 NOTE — ED Notes (Signed)
Wound to bottom of left foot X4 days. One open and one closed. Brown and yellow crust formed over top of one. Pt states one wound has been there for 6 months, the other X 4 days. Painful. Foul odor. Pt alert and oriented X4, active, cooperative, pt in NAD. RR even and unlabored, color WNL.

## 2015-11-25 ENCOUNTER — Inpatient Hospital Stay: Payer: Medicare Other

## 2015-11-25 ENCOUNTER — Ambulatory Visit
Admission: RE | Admit: 2015-11-25 | Discharge: 2015-11-25 | Disposition: A | Discharge status: Home / Self Care | Payer: Medicare Other | Source: Ambulatory Visit | Attending: Radiation Oncology | Admitting: Radiation Oncology

## 2015-11-25 DIAGNOSIS — R918 Other nonspecific abnormal finding of lung field: Secondary | ICD-10-CM | POA: Diagnosis not present

## 2015-11-25 DIAGNOSIS — I739 Peripheral vascular disease, unspecified: Secondary | ICD-10-CM | POA: Diagnosis not present

## 2015-11-25 DIAGNOSIS — I10 Essential (primary) hypertension: Secondary | ICD-10-CM | POA: Diagnosis not present

## 2015-11-25 DIAGNOSIS — C3492 Malignant neoplasm of unspecified part of left bronchus or lung: Secondary | ICD-10-CM | POA: Diagnosis not present

## 2015-11-25 DIAGNOSIS — I509 Heart failure, unspecified: Secondary | ICD-10-CM | POA: Diagnosis not present

## 2015-11-25 DIAGNOSIS — Z79899 Other long term (current) drug therapy: Secondary | ICD-10-CM | POA: Diagnosis not present

## 2015-11-25 DIAGNOSIS — R591 Generalized enlarged lymph nodes: Secondary | ICD-10-CM | POA: Diagnosis not present

## 2015-11-25 DIAGNOSIS — Z510 Encounter for antineoplastic radiation therapy: Secondary | ICD-10-CM | POA: Diagnosis not present

## 2015-11-25 DIAGNOSIS — J449 Chronic obstructive pulmonary disease, unspecified: Secondary | ICD-10-CM | POA: Diagnosis not present

## 2015-11-25 DIAGNOSIS — R0602 Shortness of breath: Secondary | ICD-10-CM | POA: Diagnosis not present

## 2015-11-25 DIAGNOSIS — E119 Type 2 diabetes mellitus without complications: Secondary | ICD-10-CM | POA: Diagnosis not present

## 2015-11-25 DIAGNOSIS — E1140 Type 2 diabetes mellitus with diabetic neuropathy, unspecified: Secondary | ICD-10-CM | POA: Diagnosis not present

## 2015-11-25 DIAGNOSIS — G9619 Other disorders of meninges, not elsewhere classified: Secondary | ICD-10-CM | POA: Diagnosis not present

## 2015-11-25 DIAGNOSIS — G629 Polyneuropathy, unspecified: Secondary | ICD-10-CM | POA: Diagnosis not present

## 2015-11-25 DIAGNOSIS — M129 Arthropathy, unspecified: Secondary | ICD-10-CM | POA: Diagnosis not present

## 2015-11-25 DIAGNOSIS — Z794 Long term (current) use of insulin: Secondary | ICD-10-CM | POA: Diagnosis not present

## 2015-11-25 DIAGNOSIS — C7931 Secondary malignant neoplasm of brain: Secondary | ICD-10-CM | POA: Diagnosis not present

## 2015-11-25 DIAGNOSIS — Z7984 Long term (current) use of oral hypoglycemic drugs: Secondary | ICD-10-CM | POA: Diagnosis not present

## 2015-11-25 MED ORDER — OXYCODONE HCL 5 MG PO TABS: ORAL_TABLET | ORAL | 0 refills | Status: DC

## 2015-11-25 NOTE — Telephone Encounter (Signed)
Per Dr Rogue Bussing refill oxycodone and will discuss pain med options at next appt 8/28

## 2015-11-26 ENCOUNTER — Ambulatory Visit
Admission: RE | Admit: 2015-11-26 | Discharge: 2015-11-26 | Disposition: A | Discharge status: Home / Self Care | Payer: Medicare Other | Source: Ambulatory Visit | Attending: Radiation Oncology | Admitting: Radiation Oncology

## 2015-11-26 DIAGNOSIS — M129 Arthropathy, unspecified: Secondary | ICD-10-CM | POA: Diagnosis not present

## 2015-11-26 DIAGNOSIS — E1165 Type 2 diabetes mellitus with hyperglycemia: Secondary | ICD-10-CM | POA: Diagnosis not present

## 2015-11-26 DIAGNOSIS — R918 Other nonspecific abnormal finding of lung field: Secondary | ICD-10-CM | POA: Diagnosis not present

## 2015-11-26 DIAGNOSIS — Z79891 Long term (current) use of opiate analgesic: Secondary | ICD-10-CM | POA: Diagnosis not present

## 2015-11-26 DIAGNOSIS — E1140 Type 2 diabetes mellitus with diabetic neuropathy, unspecified: Secondary | ICD-10-CM | POA: Diagnosis not present

## 2015-11-26 DIAGNOSIS — Z794 Long term (current) use of insulin: Secondary | ICD-10-CM | POA: Diagnosis not present

## 2015-11-26 DIAGNOSIS — Z79899 Other long term (current) drug therapy: Secondary | ICD-10-CM | POA: Diagnosis not present

## 2015-11-26 DIAGNOSIS — C3492 Malignant neoplasm of unspecified part of left bronchus or lung: Secondary | ICD-10-CM | POA: Diagnosis not present

## 2015-11-26 DIAGNOSIS — G629 Polyneuropathy, unspecified: Secondary | ICD-10-CM | POA: Diagnosis not present

## 2015-11-26 DIAGNOSIS — I739 Peripheral vascular disease, unspecified: Secondary | ICD-10-CM | POA: Diagnosis not present

## 2015-11-26 DIAGNOSIS — E119 Type 2 diabetes mellitus without complications: Secondary | ICD-10-CM | POA: Diagnosis not present

## 2015-11-26 DIAGNOSIS — J449 Chronic obstructive pulmonary disease, unspecified: Secondary | ICD-10-CM | POA: Diagnosis not present

## 2015-11-26 DIAGNOSIS — K769 Liver disease, unspecified: Secondary | ICD-10-CM | POA: Diagnosis not present

## 2015-11-26 DIAGNOSIS — Z510 Encounter for antineoplastic radiation therapy: Secondary | ICD-10-CM | POA: Diagnosis not present

## 2015-11-26 DIAGNOSIS — C3431 Malignant neoplasm of lower lobe, right bronchus or lung: Secondary | ICD-10-CM | POA: Diagnosis not present

## 2015-11-26 DIAGNOSIS — Z7984 Long term (current) use of oral hypoglycemic drugs: Secondary | ICD-10-CM | POA: Diagnosis not present

## 2015-11-26 DIAGNOSIS — R0602 Shortness of breath: Secondary | ICD-10-CM | POA: Diagnosis not present

## 2015-11-26 DIAGNOSIS — G9619 Other disorders of meninges, not elsewhere classified: Secondary | ICD-10-CM | POA: Diagnosis not present

## 2015-11-26 DIAGNOSIS — Z791 Long term (current) use of non-steroidal anti-inflammatories (NSAID): Secondary | ICD-10-CM | POA: Diagnosis not present

## 2015-11-26 DIAGNOSIS — E785 Hyperlipidemia, unspecified: Secondary | ICD-10-CM | POA: Diagnosis not present

## 2015-11-26 DIAGNOSIS — D432 Neoplasm of uncertain behavior of brain, unspecified: Secondary | ICD-10-CM | POA: Diagnosis not present

## 2015-11-26 DIAGNOSIS — I509 Heart failure, unspecified: Secondary | ICD-10-CM | POA: Diagnosis not present

## 2015-11-26 DIAGNOSIS — C7931 Secondary malignant neoplasm of brain: Secondary | ICD-10-CM | POA: Diagnosis not present

## 2015-11-26 DIAGNOSIS — R591 Generalized enlarged lymph nodes: Secondary | ICD-10-CM | POA: Diagnosis not present

## 2015-11-26 DIAGNOSIS — Z483 Aftercare following surgery for neoplasm: Secondary | ICD-10-CM | POA: Diagnosis not present

## 2015-11-26 DIAGNOSIS — I10 Essential (primary) hypertension: Secondary | ICD-10-CM | POA: Diagnosis not present

## 2015-11-26 NOTE — Patient Instructions (Signed)
Pembrolizumab injection What is this medicine? PEMBROLIZUMAB (pem broe liz ue mab) is a monoclonal antibody. It is used to treat melanoma and non-small cell lung cancer. This medicine may be used for other purposes; ask your health care provider or pharmacist if you have questions. What should I tell my health care provider before I take this medicine? They need to know if you have any of these conditions: -diabetes -immune system problems -inflammatory bowel disease -liver disease -lung or breathing disease -lupus -an unusual or allergic reaction to pembrolizumab, other medicines, foods, dyes, or preservatives -pregnant or trying to get pregnant -breast-feeding How should I use this medicine? This medicine is for infusion into a vein. It is given by a health care professional in a hospital or clinic setting. A special MedGuide will be given to you before each treatment. Be sure to read this information carefully each time. Talk to your pediatrician regarding the use of this medicine in children. Special care may be needed. Overdosage: If you think you have taken too much of this medicine contact a poison control center or emergency room at once. NOTE: This medicine is only for you. Do not share this medicine with others. What if I miss a dose? It is important not to miss your dose. Call your doctor or health care professional if you are unable to keep an appointment. What may interact with this medicine? Interactions have not been studied. Give your health care provider a list of all the medicines, herbs, non-prescription drugs, or dietary supplements you use. Also tell them if you smoke, drink alcohol, or use illegal drugs. Some items may interact with your medicine. This list may not describe all possible interactions. Give your health care provider a list of all the medicines, herbs, non-prescription drugs, or dietary supplements you use. Also tell them if you smoke, drink alcohol, or  use illegal drugs. Some items may interact with your medicine. What should I watch for while using this medicine? Your condition will be monitored carefully while you are receiving this medicine. You may need blood work done while you are taking this medicine. Do not become pregnant while taking this medicine or for 4 months after stopping it. Women should inform their doctor if they wish to become pregnant or think they might be pregnant. There is a potential for serious side effects to an unborn child. Talk to your health care professional or pharmacist for more information. Do not breast-feed an infant while taking this medicine or for 4 months after the last dose. What side effects may I notice from receiving this medicine? Side effects that you should report to your doctor or health care professional as soon as possible: -allergic reactions like skin rash, itching or hives, swelling of the face, lips, or tongue -bloody or black, tarry stools -breathing problems -change in the amount of urine -changes in vision -chest pain -chills -dark urine -dizziness or feeling faint or lightheaded -fast or irregular heartbeat -fever -flushing -hair loss -muscle pain -muscle weakness -persistent headache -signs and symptoms of high blood sugar such as dizziness; dry mouth; dry skin; fruity breath; nausea; stomach pain; increased hunger or thirst; increased urination -signs and symptoms of liver injury like dark urine, light-colored stools, loss of appetite, nausea, right upper belly pain, yellowing of the eyes or skin -stomach pain -weight loss Side effects that usually do not require medical attention (Report these to your doctor or health care professional if they continue or are bothersome.):constipation -cough -diarrhea -joint pain -  tiredness This list may not describe all possible side effects. Call your doctor for medical advice about side effects. You may report side effects to FDA at  1-800-FDA-1088. Where should I keep my medicine? This drug is given in a hospital or clinic and will not be stored at home. NOTE: This sheet is a summary. It may not cover all possible information. If you have questions about this medicine, talk to your doctor, pharmacist, or health care provider.    2016, [employer]. (2014-05-27 17:24:19)

## 2015-11-27 ENCOUNTER — Ambulatory Visit
Admission: RE | Admit: 2015-11-27 | Discharge: 2015-11-27 | Disposition: A | Discharge status: Home / Self Care | Payer: Medicare Other | Source: Ambulatory Visit | Attending: Radiation Oncology | Admitting: Radiation Oncology

## 2015-11-27 DIAGNOSIS — R918 Other nonspecific abnormal finding of lung field: Secondary | ICD-10-CM | POA: Diagnosis not present

## 2015-11-27 DIAGNOSIS — Z7984 Long term (current) use of oral hypoglycemic drugs: Secondary | ICD-10-CM | POA: Diagnosis not present

## 2015-11-27 DIAGNOSIS — J449 Chronic obstructive pulmonary disease, unspecified: Secondary | ICD-10-CM | POA: Diagnosis not present

## 2015-11-27 DIAGNOSIS — C7931 Secondary malignant neoplasm of brain: Secondary | ICD-10-CM | POA: Diagnosis not present

## 2015-11-27 DIAGNOSIS — R0602 Shortness of breath: Secondary | ICD-10-CM | POA: Diagnosis not present

## 2015-11-27 DIAGNOSIS — G629 Polyneuropathy, unspecified: Secondary | ICD-10-CM | POA: Diagnosis not present

## 2015-11-27 DIAGNOSIS — C3492 Malignant neoplasm of unspecified part of left bronchus or lung: Secondary | ICD-10-CM | POA: Diagnosis not present

## 2015-11-27 DIAGNOSIS — R591 Generalized enlarged lymph nodes: Secondary | ICD-10-CM | POA: Diagnosis not present

## 2015-11-27 DIAGNOSIS — Z510 Encounter for antineoplastic radiation therapy: Secondary | ICD-10-CM | POA: Diagnosis not present

## 2015-11-27 DIAGNOSIS — I10 Essential (primary) hypertension: Secondary | ICD-10-CM | POA: Diagnosis not present

## 2015-11-27 DIAGNOSIS — I509 Heart failure, unspecified: Secondary | ICD-10-CM | POA: Diagnosis not present

## 2015-11-27 DIAGNOSIS — E1140 Type 2 diabetes mellitus with diabetic neuropathy, unspecified: Secondary | ICD-10-CM | POA: Diagnosis not present

## 2015-11-27 DIAGNOSIS — Z791 Long term (current) use of non-steroidal anti-inflammatories (NSAID): Secondary | ICD-10-CM | POA: Diagnosis not present

## 2015-11-27 DIAGNOSIS — E785 Hyperlipidemia, unspecified: Secondary | ICD-10-CM | POA: Diagnosis not present

## 2015-11-27 DIAGNOSIS — I739 Peripheral vascular disease, unspecified: Secondary | ICD-10-CM | POA: Diagnosis not present

## 2015-11-27 DIAGNOSIS — E119 Type 2 diabetes mellitus without complications: Secondary | ICD-10-CM | POA: Diagnosis not present

## 2015-11-27 DIAGNOSIS — C3431 Malignant neoplasm of lower lobe, right bronchus or lung: Secondary | ICD-10-CM | POA: Diagnosis not present

## 2015-11-27 DIAGNOSIS — G9619 Other disorders of meninges, not elsewhere classified: Secondary | ICD-10-CM | POA: Diagnosis not present

## 2015-11-27 DIAGNOSIS — E1165 Type 2 diabetes mellitus with hyperglycemia: Secondary | ICD-10-CM | POA: Diagnosis not present

## 2015-11-27 DIAGNOSIS — Z794 Long term (current) use of insulin: Secondary | ICD-10-CM | POA: Diagnosis not present

## 2015-11-27 DIAGNOSIS — K769 Liver disease, unspecified: Secondary | ICD-10-CM | POA: Diagnosis not present

## 2015-11-27 DIAGNOSIS — D432 Neoplasm of uncertain behavior of brain, unspecified: Secondary | ICD-10-CM | POA: Diagnosis not present

## 2015-11-27 DIAGNOSIS — M129 Arthropathy, unspecified: Secondary | ICD-10-CM | POA: Diagnosis not present

## 2015-11-27 DIAGNOSIS — Z483 Aftercare following surgery for neoplasm: Secondary | ICD-10-CM | POA: Diagnosis not present

## 2015-11-27 DIAGNOSIS — Z79891 Long term (current) use of opiate analgesic: Secondary | ICD-10-CM | POA: Diagnosis not present

## 2015-11-27 DIAGNOSIS — Z79899 Other long term (current) drug therapy: Secondary | ICD-10-CM | POA: Diagnosis not present

## 2015-11-28 DIAGNOSIS — D432 Neoplasm of uncertain behavior of brain, unspecified: Secondary | ICD-10-CM | POA: Diagnosis not present

## 2015-11-28 DIAGNOSIS — E785 Hyperlipidemia, unspecified: Secondary | ICD-10-CM | POA: Diagnosis not present

## 2015-11-28 DIAGNOSIS — R918 Other nonspecific abnormal finding of lung field: Secondary | ICD-10-CM | POA: Diagnosis not present

## 2015-11-28 DIAGNOSIS — Z791 Long term (current) use of non-steroidal anti-inflammatories (NSAID): Secondary | ICD-10-CM | POA: Diagnosis not present

## 2015-11-28 DIAGNOSIS — C3431 Malignant neoplasm of lower lobe, right bronchus or lung: Secondary | ICD-10-CM | POA: Diagnosis not present

## 2015-11-28 DIAGNOSIS — K769 Liver disease, unspecified: Secondary | ICD-10-CM | POA: Diagnosis not present

## 2015-11-28 DIAGNOSIS — I10 Essential (primary) hypertension: Secondary | ICD-10-CM | POA: Diagnosis not present

## 2015-11-28 DIAGNOSIS — Z794 Long term (current) use of insulin: Secondary | ICD-10-CM | POA: Diagnosis not present

## 2015-11-28 DIAGNOSIS — Z483 Aftercare following surgery for neoplasm: Secondary | ICD-10-CM | POA: Diagnosis not present

## 2015-11-28 DIAGNOSIS — E1165 Type 2 diabetes mellitus with hyperglycemia: Secondary | ICD-10-CM | POA: Diagnosis not present

## 2015-11-28 DIAGNOSIS — Z79891 Long term (current) use of opiate analgesic: Secondary | ICD-10-CM | POA: Diagnosis not present

## 2015-11-30 ENCOUNTER — Ambulatory Visit
Admission: RE | Admit: 2015-11-30 | Discharge: 2015-11-30 | Disposition: A | Discharge status: Home / Self Care | Payer: Medicare Other | Source: Ambulatory Visit | Attending: Radiation Oncology | Admitting: Radiation Oncology

## 2015-11-30 DIAGNOSIS — E1165 Type 2 diabetes mellitus with hyperglycemia: Secondary | ICD-10-CM | POA: Diagnosis not present

## 2015-11-30 DIAGNOSIS — M129 Arthropathy, unspecified: Secondary | ICD-10-CM | POA: Diagnosis not present

## 2015-11-30 DIAGNOSIS — D432 Neoplasm of uncertain behavior of brain, unspecified: Secondary | ICD-10-CM | POA: Diagnosis not present

## 2015-11-30 DIAGNOSIS — Z791 Long term (current) use of non-steroidal anti-inflammatories (NSAID): Secondary | ICD-10-CM | POA: Diagnosis not present

## 2015-11-30 DIAGNOSIS — I10 Essential (primary) hypertension: Secondary | ICD-10-CM | POA: Diagnosis not present

## 2015-11-30 DIAGNOSIS — C3492 Malignant neoplasm of unspecified part of left bronchus or lung: Secondary | ICD-10-CM | POA: Diagnosis not present

## 2015-11-30 DIAGNOSIS — K769 Liver disease, unspecified: Secondary | ICD-10-CM | POA: Diagnosis not present

## 2015-11-30 DIAGNOSIS — Z483 Aftercare following surgery for neoplasm: Secondary | ICD-10-CM | POA: Diagnosis not present

## 2015-11-30 DIAGNOSIS — R591 Generalized enlarged lymph nodes: Secondary | ICD-10-CM | POA: Diagnosis not present

## 2015-11-30 DIAGNOSIS — R0602 Shortness of breath: Secondary | ICD-10-CM | POA: Diagnosis not present

## 2015-11-30 DIAGNOSIS — E785 Hyperlipidemia, unspecified: Secondary | ICD-10-CM | POA: Diagnosis not present

## 2015-11-30 DIAGNOSIS — I739 Peripheral vascular disease, unspecified: Secondary | ICD-10-CM | POA: Diagnosis not present

## 2015-11-30 DIAGNOSIS — Z510 Encounter for antineoplastic radiation therapy: Secondary | ICD-10-CM | POA: Diagnosis not present

## 2015-11-30 DIAGNOSIS — Z7984 Long term (current) use of oral hypoglycemic drugs: Secondary | ICD-10-CM | POA: Diagnosis not present

## 2015-11-30 DIAGNOSIS — G629 Polyneuropathy, unspecified: Secondary | ICD-10-CM | POA: Diagnosis not present

## 2015-11-30 DIAGNOSIS — G9619 Other disorders of meninges, not elsewhere classified: Secondary | ICD-10-CM | POA: Diagnosis not present

## 2015-11-30 DIAGNOSIS — C7931 Secondary malignant neoplasm of brain: Secondary | ICD-10-CM | POA: Diagnosis not present

## 2015-11-30 DIAGNOSIS — I509 Heart failure, unspecified: Secondary | ICD-10-CM | POA: Diagnosis not present

## 2015-11-30 DIAGNOSIS — E119 Type 2 diabetes mellitus without complications: Secondary | ICD-10-CM | POA: Diagnosis not present

## 2015-11-30 DIAGNOSIS — Z79891 Long term (current) use of opiate analgesic: Secondary | ICD-10-CM | POA: Diagnosis not present

## 2015-11-30 DIAGNOSIS — C3431 Malignant neoplasm of lower lobe, right bronchus or lung: Secondary | ICD-10-CM | POA: Diagnosis not present

## 2015-11-30 DIAGNOSIS — Z79899 Other long term (current) drug therapy: Secondary | ICD-10-CM | POA: Diagnosis not present

## 2015-11-30 DIAGNOSIS — J449 Chronic obstructive pulmonary disease, unspecified: Secondary | ICD-10-CM | POA: Diagnosis not present

## 2015-11-30 DIAGNOSIS — E1140 Type 2 diabetes mellitus with diabetic neuropathy, unspecified: Secondary | ICD-10-CM | POA: Diagnosis not present

## 2015-11-30 DIAGNOSIS — Z794 Long term (current) use of insulin: Secondary | ICD-10-CM | POA: Diagnosis not present

## 2015-11-30 DIAGNOSIS — R918 Other nonspecific abnormal finding of lung field: Secondary | ICD-10-CM | POA: Diagnosis not present

## 2015-12-01 ENCOUNTER — Ambulatory Visit
Admission: RE | Admit: 2015-12-01 | Discharge: 2015-12-01 | Disposition: A | Discharge status: Home / Self Care | Payer: Medicare Other | Source: Ambulatory Visit | Attending: Radiation Oncology | Admitting: Radiation Oncology

## 2015-12-01 ENCOUNTER — Inpatient Hospital Stay: Payer: Medicare Other

## 2015-12-01 ENCOUNTER — Other Ambulatory Visit: Payer: Self-pay | Admitting: Internal Medicine

## 2015-12-01 ENCOUNTER — Ambulatory Visit: Payer: Medicare Other

## 2015-12-01 DIAGNOSIS — G629 Polyneuropathy, unspecified: Secondary | ICD-10-CM | POA: Diagnosis not present

## 2015-12-01 DIAGNOSIS — E119 Type 2 diabetes mellitus without complications: Secondary | ICD-10-CM | POA: Diagnosis not present

## 2015-12-01 DIAGNOSIS — E1140 Type 2 diabetes mellitus with diabetic neuropathy, unspecified: Secondary | ICD-10-CM | POA: Diagnosis not present

## 2015-12-01 DIAGNOSIS — M129 Arthropathy, unspecified: Secondary | ICD-10-CM | POA: Diagnosis not present

## 2015-12-01 DIAGNOSIS — Z79899 Other long term (current) drug therapy: Secondary | ICD-10-CM | POA: Diagnosis not present

## 2015-12-01 DIAGNOSIS — Z794 Long term (current) use of insulin: Secondary | ICD-10-CM | POA: Diagnosis not present

## 2015-12-01 DIAGNOSIS — J449 Chronic obstructive pulmonary disease, unspecified: Secondary | ICD-10-CM | POA: Diagnosis not present

## 2015-12-01 DIAGNOSIS — G9619 Other disorders of meninges, not elsewhere classified: Secondary | ICD-10-CM | POA: Diagnosis not present

## 2015-12-01 DIAGNOSIS — I739 Peripheral vascular disease, unspecified: Secondary | ICD-10-CM | POA: Diagnosis not present

## 2015-12-01 DIAGNOSIS — R0602 Shortness of breath: Secondary | ICD-10-CM | POA: Diagnosis not present

## 2015-12-01 DIAGNOSIS — I509 Heart failure, unspecified: Secondary | ICD-10-CM | POA: Diagnosis not present

## 2015-12-01 DIAGNOSIS — R591 Generalized enlarged lymph nodes: Secondary | ICD-10-CM | POA: Diagnosis not present

## 2015-12-01 DIAGNOSIS — R918 Other nonspecific abnormal finding of lung field: Secondary | ICD-10-CM | POA: Diagnosis not present

## 2015-12-01 DIAGNOSIS — Z510 Encounter for antineoplastic radiation therapy: Secondary | ICD-10-CM | POA: Diagnosis not present

## 2015-12-01 DIAGNOSIS — C7931 Secondary malignant neoplasm of brain: Secondary | ICD-10-CM | POA: Diagnosis not present

## 2015-12-01 DIAGNOSIS — C3492 Malignant neoplasm of unspecified part of left bronchus or lung: Secondary | ICD-10-CM | POA: Diagnosis not present

## 2015-12-01 DIAGNOSIS — I10 Essential (primary) hypertension: Secondary | ICD-10-CM | POA: Diagnosis not present

## 2015-12-01 DIAGNOSIS — Z7984 Long term (current) use of oral hypoglycemic drugs: Secondary | ICD-10-CM | POA: Diagnosis not present

## 2015-12-02 ENCOUNTER — Emergency Department: Payer: Medicare Other

## 2015-12-02 ENCOUNTER — Ambulatory Visit
Admission: RE | Admit: 2015-12-02 | Discharge: 2015-12-02 | Disposition: A | Discharge status: Home / Self Care | Payer: Medicare Other | Source: Ambulatory Visit | Attending: Radiation Oncology | Admitting: Radiation Oncology

## 2015-12-02 ENCOUNTER — Inpatient Hospital Stay: Payer: Medicare Other

## 2015-12-02 ENCOUNTER — Inpatient Hospital Stay
Admission: EM | Admit: 2015-12-02 | Discharge: 2015-12-06 | DRG: 871 | Disposition: A | Discharge status: Home Health | Payer: Medicare Other | Attending: Specialist | Admitting: Specialist

## 2015-12-02 ENCOUNTER — Encounter: Payer: Self-pay | Admitting: Occupational Medicine

## 2015-12-02 ENCOUNTER — Ambulatory Visit: Payer: Medicare Other

## 2015-12-02 ENCOUNTER — Other Ambulatory Visit: Payer: Self-pay

## 2015-12-02 DIAGNOSIS — N390 Urinary tract infection, site not specified: Secondary | ICD-10-CM | POA: Diagnosis present

## 2015-12-02 DIAGNOSIS — Z7952 Long term (current) use of systemic steroids: Secondary | ICD-10-CM

## 2015-12-02 DIAGNOSIS — R6521 Severe sepsis with septic shock: Secondary | ICD-10-CM | POA: Diagnosis not present

## 2015-12-02 DIAGNOSIS — Z8249 Family history of ischemic heart disease and other diseases of the circulatory system: Secondary | ICD-10-CM

## 2015-12-02 DIAGNOSIS — Z9861 Coronary angioplasty status: Secondary | ICD-10-CM | POA: Diagnosis not present

## 2015-12-02 DIAGNOSIS — Z791 Long term (current) use of non-steroidal anti-inflammatories (NSAID): Secondary | ICD-10-CM | POA: Diagnosis not present

## 2015-12-02 DIAGNOSIS — C3490 Malignant neoplasm of unspecified part of unspecified bronchus or lung: Secondary | ICD-10-CM | POA: Diagnosis not present

## 2015-12-02 DIAGNOSIS — Z794 Long term (current) use of insulin: Secondary | ICD-10-CM

## 2015-12-02 DIAGNOSIS — I110 Hypertensive heart disease with heart failure: Secondary | ICD-10-CM | POA: Diagnosis present

## 2015-12-02 DIAGNOSIS — E1165 Type 2 diabetes mellitus with hyperglycemia: Secondary | ICD-10-CM | POA: Diagnosis present

## 2015-12-02 DIAGNOSIS — Z7984 Long term (current) use of oral hypoglycemic drugs: Secondary | ICD-10-CM | POA: Diagnosis not present

## 2015-12-02 DIAGNOSIS — E1142 Type 2 diabetes mellitus with diabetic polyneuropathy: Secondary | ICD-10-CM | POA: Diagnosis not present

## 2015-12-02 DIAGNOSIS — Z9221 Personal history of antineoplastic chemotherapy: Secondary | ICD-10-CM | POA: Diagnosis not present

## 2015-12-02 DIAGNOSIS — E11621 Type 2 diabetes mellitus with foot ulcer: Secondary | ICD-10-CM | POA: Diagnosis present

## 2015-12-02 DIAGNOSIS — E1151 Type 2 diabetes mellitus with diabetic peripheral angiopathy without gangrene: Secondary | ICD-10-CM | POA: Diagnosis not present

## 2015-12-02 DIAGNOSIS — C3491 Malignant neoplasm of unspecified part of right bronchus or lung: Secondary | ICD-10-CM | POA: Diagnosis not present

## 2015-12-02 DIAGNOSIS — I739 Peripheral vascular disease, unspecified: Secondary | ICD-10-CM | POA: Diagnosis not present

## 2015-12-02 DIAGNOSIS — A4151 Sepsis due to Escherichia coli [E. coli]: Principal | ICD-10-CM | POA: Diagnosis present

## 2015-12-02 DIAGNOSIS — A419 Sepsis, unspecified organism: Secondary | ICD-10-CM | POA: Diagnosis present

## 2015-12-02 DIAGNOSIS — K769 Liver disease, unspecified: Secondary | ICD-10-CM | POA: Diagnosis not present

## 2015-12-02 DIAGNOSIS — J189 Pneumonia, unspecified organism: Secondary | ICD-10-CM | POA: Diagnosis present

## 2015-12-02 DIAGNOSIS — G629 Polyneuropathy, unspecified: Secondary | ICD-10-CM | POA: Diagnosis not present

## 2015-12-02 DIAGNOSIS — E876 Hypokalemia: Secondary | ICD-10-CM | POA: Diagnosis not present

## 2015-12-02 DIAGNOSIS — Z79899 Other long term (current) drug therapy: Secondary | ICD-10-CM

## 2015-12-02 DIAGNOSIS — E860 Dehydration: Secondary | ICD-10-CM | POA: Diagnosis not present

## 2015-12-02 DIAGNOSIS — N179 Acute kidney failure, unspecified: Secondary | ICD-10-CM | POA: Diagnosis not present

## 2015-12-02 DIAGNOSIS — R918 Other nonspecific abnormal finding of lung field: Secondary | ICD-10-CM | POA: Diagnosis not present

## 2015-12-02 DIAGNOSIS — I509 Heart failure, unspecified: Secondary | ICD-10-CM | POA: Diagnosis present

## 2015-12-02 DIAGNOSIS — I10 Essential (primary) hypertension: Secondary | ICD-10-CM | POA: Diagnosis not present

## 2015-12-02 DIAGNOSIS — J449 Chronic obstructive pulmonary disease, unspecified: Secondary | ICD-10-CM | POA: Diagnosis not present

## 2015-12-02 DIAGNOSIS — E119 Type 2 diabetes mellitus without complications: Secondary | ICD-10-CM | POA: Diagnosis not present

## 2015-12-02 DIAGNOSIS — R6520 Severe sepsis without septic shock: Secondary | ICD-10-CM | POA: Diagnosis present

## 2015-12-02 DIAGNOSIS — R591 Generalized enlarged lymph nodes: Secondary | ICD-10-CM | POA: Diagnosis not present

## 2015-12-02 DIAGNOSIS — E785 Hyperlipidemia, unspecified: Secondary | ICD-10-CM | POA: Diagnosis present

## 2015-12-02 DIAGNOSIS — J9601 Acute respiratory failure with hypoxia: Secondary | ICD-10-CM | POA: Diagnosis not present

## 2015-12-02 DIAGNOSIS — G9340 Encephalopathy, unspecified: Secondary | ICD-10-CM | POA: Diagnosis present

## 2015-12-02 DIAGNOSIS — J440 Chronic obstructive pulmonary disease with acute lower respiratory infection: Secondary | ICD-10-CM | POA: Diagnosis present

## 2015-12-02 DIAGNOSIS — C7931 Secondary malignant neoplasm of brain: Secondary | ICD-10-CM | POA: Diagnosis present

## 2015-12-02 DIAGNOSIS — J811 Chronic pulmonary edema: Secondary | ICD-10-CM | POA: Diagnosis not present

## 2015-12-02 DIAGNOSIS — G9341 Metabolic encephalopathy: Secondary | ICD-10-CM | POA: Diagnosis not present

## 2015-12-02 DIAGNOSIS — R0602 Shortness of breath: Secondary | ICD-10-CM | POA: Diagnosis not present

## 2015-12-02 DIAGNOSIS — Z880 Allergy status to penicillin: Secondary | ICD-10-CM

## 2015-12-02 DIAGNOSIS — Z833 Family history of diabetes mellitus: Secondary | ICD-10-CM

## 2015-12-02 DIAGNOSIS — F17210 Nicotine dependence, cigarettes, uncomplicated: Secondary | ICD-10-CM | POA: Diagnosis present

## 2015-12-02 DIAGNOSIS — Z510 Encounter for antineoplastic radiation therapy: Secondary | ICD-10-CM | POA: Diagnosis not present

## 2015-12-02 DIAGNOSIS — Z483 Aftercare following surgery for neoplasm: Secondary | ICD-10-CM | POA: Diagnosis not present

## 2015-12-02 DIAGNOSIS — E1140 Type 2 diabetes mellitus with diabetic neuropathy, unspecified: Secondary | ICD-10-CM | POA: Diagnosis not present

## 2015-12-02 DIAGNOSIS — J9621 Acute and chronic respiratory failure with hypoxia: Secondary | ICD-10-CM | POA: Diagnosis present

## 2015-12-02 DIAGNOSIS — D432 Neoplasm of uncertain behavior of brain, unspecified: Secondary | ICD-10-CM | POA: Diagnosis not present

## 2015-12-02 DIAGNOSIS — C3431 Malignant neoplasm of lower lobe, right bronchus or lung: Secondary | ICD-10-CM | POA: Diagnosis present

## 2015-12-02 DIAGNOSIS — R531 Weakness: Secondary | ICD-10-CM | POA: Diagnosis not present

## 2015-12-02 DIAGNOSIS — C787 Secondary malignant neoplasm of liver and intrahepatic bile duct: Secondary | ICD-10-CM | POA: Diagnosis not present

## 2015-12-02 DIAGNOSIS — C3492 Malignant neoplasm of unspecified part of left bronchus or lung: Secondary | ICD-10-CM | POA: Diagnosis not present

## 2015-12-02 DIAGNOSIS — Z888 Allergy status to other drugs, medicaments and biological substances status: Secondary | ICD-10-CM

## 2015-12-02 DIAGNOSIS — G9619 Other disorders of meninges, not elsewhere classified: Secondary | ICD-10-CM | POA: Diagnosis not present

## 2015-12-02 DIAGNOSIS — R6889 Other general symptoms and signs: Secondary | ICD-10-CM | POA: Diagnosis not present

## 2015-12-02 DIAGNOSIS — Z79891 Long term (current) use of opiate analgesic: Secondary | ICD-10-CM | POA: Diagnosis not present

## 2015-12-02 DIAGNOSIS — L97529 Non-pressure chronic ulcer of other part of left foot with unspecified severity: Secondary | ICD-10-CM | POA: Diagnosis not present

## 2015-12-02 DIAGNOSIS — M129 Arthropathy, unspecified: Secondary | ICD-10-CM | POA: Diagnosis not present

## 2015-12-02 DIAGNOSIS — Z7951 Long term (current) use of inhaled steroids: Secondary | ICD-10-CM

## 2015-12-02 DIAGNOSIS — M25511 Pain in right shoulder: Secondary | ICD-10-CM | POA: Diagnosis not present

## 2015-12-02 DIAGNOSIS — Z452 Encounter for adjustment and management of vascular access device: Secondary | ICD-10-CM | POA: Diagnosis not present

## 2015-12-02 DIAGNOSIS — I959 Hypotension, unspecified: Secondary | ICD-10-CM | POA: Diagnosis not present

## 2015-12-02 LAB — CBC WITH DIFFERENTIAL/PLATELET
BASOS PCT: 1 %
Basophils Absolute: 0.1 10*3/uL (ref 0–0.1)
Eosinophils Absolute: 0.4 10*3/uL (ref 0–0.7)
Eosinophils Relative: 3 %
HEMATOCRIT: 32.7 % — AB (ref 35.0–47.0)
Hemoglobin: 11.1 g/dL — ABNORMAL LOW (ref 12.0–16.0)
Lymphocytes Relative: 15 %
Lymphs Abs: 1.6 10*3/uL (ref 1.0–3.6)
MCH: 30.3 pg (ref 26.0–34.0)
MCHC: 33.8 g/dL (ref 32.0–36.0)
MCV: 89.6 fL (ref 80.0–100.0)
MONO ABS: 0.6 10*3/uL (ref 0.2–0.9)
MONOS PCT: 6 %
NEUTROS ABS: 7.7 10*3/uL — AB (ref 1.4–6.5)
Neutrophils Relative %: 75 %
Platelets: 265 10*3/uL (ref 150–440)
RBC: 3.65 MIL/uL — ABNORMAL LOW (ref 3.80–5.20)
RDW: 14.5 % (ref 11.5–14.5)
WBC: 10.4 10*3/uL (ref 3.6–11.0)

## 2015-12-02 LAB — COMPREHENSIVE METABOLIC PANEL
ALBUMIN: 2.5 g/dL — AB (ref 3.5–5.0)
ALK PHOS: 155 U/L — AB (ref 38–126)
ALT: 19 U/L (ref 14–54)
ANION GAP: 10 (ref 5–15)
AST: 22 U/L (ref 15–41)
BUN: 23 mg/dL — ABNORMAL HIGH (ref 6–20)
CALCIUM: 8.4 mg/dL — AB (ref 8.9–10.3)
CO2: 23 mmol/L (ref 22–32)
CREATININE: 1.22 mg/dL — AB (ref 0.44–1.00)
Chloride: 102 mmol/L (ref 101–111)
GFR calc Af Amer: 55 mL/min — ABNORMAL LOW (ref >60)
GFR calc non Af Amer: 48 mL/min — ABNORMAL LOW (ref >60)
GLUCOSE: 180 mg/dL — AB (ref 65–99)
Potassium: 3.8 mmol/L (ref 3.5–5.1)
SODIUM: 135 mmol/L (ref 135–145)
Total Bilirubin: 0.5 mg/dL (ref 0.3–1.2)
Total Protein: 7 g/dL (ref 6.5–8.1)

## 2015-12-02 LAB — URINALYSIS COMPLETE WITH MICROSCOPIC (ARMC ONLY)
BILIRUBIN URINE: NEGATIVE
Glucose, UA: 50 mg/dL — AB
Ketones, ur: NEGATIVE mg/dL
Nitrite: POSITIVE — AB
PH: 5 (ref 5.0–8.0)
PROTEIN: 100 mg/dL — AB
Specific Gravity, Urine: 1.02 (ref 1.005–1.030)

## 2015-12-02 LAB — TROPONIN I

## 2015-12-02 LAB — LACTIC ACID, PLASMA: Lactic Acid, Venous: 1.6 mmol/L (ref 0.5–1.9)

## 2015-12-02 LAB — BRAIN NATRIURETIC PEPTIDE: B Natriuretic Peptide: 56 pg/mL (ref 0.0–100.0)

## 2015-12-02 MED ORDER — IOPAMIDOL (ISOVUE-300) INJECTION 61%
100.0000 mL | Freq: Once | INTRAVENOUS | Status: AC | PRN
  Administered 2015-12-02: 100 mL via INTRAVENOUS

## 2015-12-02 MED ORDER — ALBUTEROL SULFATE (2.5 MG/3ML) 0.083% IN NEBU
10.0000 mg/h | INHALATION_SOLUTION | RESPIRATORY_TRACT | Status: DC
  Filled 2015-12-02: qty 3

## 2015-12-02 MED ORDER — ALBUTEROL SULFATE (2.5 MG/3ML) 0.083% IN NEBU
2.5000 mg | INHALATION_SOLUTION | Freq: Once | RESPIRATORY_TRACT | Status: AC
  Administered 2015-12-02: 2.5 mg via RESPIRATORY_TRACT
  Filled 2015-12-02: qty 3

## 2015-12-02 MED ORDER — SODIUM CHLORIDE 0.9 % IV BOLUS (SEPSIS)
1000.0000 mL | Freq: Once | INTRAVENOUS | Status: AC
  Administered 2015-12-02: 1000 mL via INTRAVENOUS

## 2015-12-02 MED ORDER — DEXTROSE 5 % IV SOLN
2.0000 g | Freq: Once | INTRAVENOUS | Status: AC
  Administered 2015-12-02: 2 g via INTRAVENOUS
  Filled 2015-12-02: qty 2

## 2015-12-02 MED ORDER — DIATRIZOATE MEGLUMINE & SODIUM 66-10 % PO SOLN
15.0000 mL | Freq: Once | ORAL | Status: AC
  Administered 2015-12-02: 15 mL via ORAL

## 2015-12-02 MED ORDER — SODIUM CHLORIDE 0.9 % IV SOLN
1000.0000 mL | INTRAVENOUS | Status: DC
  Administered 2015-12-02: 1000 mL via INTRAVENOUS

## 2015-12-02 MED ORDER — LEVOFLOXACIN IN D5W 750 MG/150ML IV SOLN
750.0000 mg | Freq: Once | INTRAVENOUS | Status: AC
  Administered 2015-12-02: 750 mg via INTRAVENOUS
  Filled 2015-12-02: qty 150

## 2015-12-02 MED ORDER — OXYCODONE HCL 5 MG PO TABS
5.0000 mg | ORAL_TABLET | Freq: Once | ORAL | Status: AC
  Administered 2015-12-03: 5 mg via ORAL
  Filled 2015-12-02: qty 1

## 2015-12-02 MED ORDER — NOREPINEPHRINE BITARTRATE 1 MG/ML IV SOLN: 4.0000 ug/min | Freq: Once | INTRAVENOUS | Status: DC

## 2015-12-02 MED ORDER — NOREPINEPHRINE 4 MG/250ML-% IV SOLN
4.0000 ug/min | Freq: Once | INTRAVENOUS | Status: DC
  Filled 2015-12-02: qty 250

## 2015-12-02 MED ORDER — VANCOMYCIN HCL IN DEXTROSE 1-5 GM/200ML-% IV SOLN
1000.0000 mg | Freq: Once | INTRAVENOUS | Status: AC
  Administered 2015-12-02: 1000 mg via INTRAVENOUS
  Filled 2015-12-02: qty 200

## 2015-12-02 NOTE — ED Notes (Signed)
Patient transported to CT 

## 2015-12-02 NOTE — ED Notes (Addendum)
MD at bedside doing central line holding levophed at this time due to BP coming up some

## 2015-12-02 NOTE — ED Provider Notes (Signed)
[facility] Emergency Department Provider Note    First MD Initiated Contact with Patient 12/02/15 2124     (approximate)  I have reviewed the triage vital signs and the nursing notes.   HISTORY  Chief Complaint Hypotension and Abdominal Pain (lower)    HPI Marie [name] is a 60 y.o. female  history of lung cancer as well as active brain cancer undergoing radiation therapy presents critically ill to the ER with report of fever, confusion and low blood pressure. Patient also has a history of congestive heart failure. States that she's been feeling unwell for the past several days. States she has been having dysuria for 3-4 days and has diffuse lower abdominal pain. Patient was brought in by EMS emergently due to hypotension and altered mental status. Upon arrival to the ER the patient was drowsy but able to follow commands and provide brief history.   Past Medical History:  Diagnosis Date  . Arthritis   . Cancer ([place])    Lung CA right wedge removed  . CHF (congestive heart failure) ([place])   . COPD (chronic obstructive pulmonary disease) (H[facility])    has oxygen but doesn't use  . Diabetes mellitus type 2 with neurological manifestations ([place])    Peripheral neuropathy and foot ulcers  . Hypertension   . PAD (peripheral artery disease) ([place])   . Peripheral neuropathy ([place])   . Shortness of breath dyspnea     Patient Active Problem List   Diagnosis Date Noted  . Acute encephalopathy 12/02/2015  . AKI (acute kidney injury) ([place]) 12/02/2015  . Severe sepsis ([place]) 12/02/2015  . UTI (lower urinary tract infection) 12/02/2015  . Poorly controlled diabetes mellitus ([place]) 11/18/2015  . Brain metastasis ([place]) 11/04/2015  . Osteomyelitis ([place]) 11/01/2014  . Malignant neoplasm of lower lobe of right lung ([place]) 10/02/2014    Past Surgical History:  Procedure Laterality Date  . ABDOMINAL HYSTERECTOMY    . BACK SURGERY  x 7  . CARPAL TUNNEL RELEASE Bilateral     . CESAREAN SECTION     x3  . CORONARY ANGIOPLASTY     no stents  . FOOT SURGERY Left    diabetic ulcer  . KNEE ARTHROSCOPY Bilateral   . TONSILLECTOMY    . VIDEO ASSISTED THORACOSCOPY (VATS)/THOROCOTOMY Right 08/26/2014   Procedure: VIDEO ASSISTED THORACOSCOPY (VATS)/THOROCOTOMY;  Surgeon: Nestor Lewandowsky, MD;  Location: [facility] ORS;  Service: General;  Laterality: Right;    Prior to Admission medications   Medication Sig Start Date End Date Taking? Authorizing Provider  ALPRAZolam (XANAX) 0.25 MG tablet Take 1 tablet (0.25 mg total) by mouth 2 (two) times daily. 11/18/15   Cammie Sickle, MD  budesonide-formoterol (SYMBICORT) 160-4.5 MCG/ACT inhaler Inhale 2 puffs into the lungs 2 (two) times daily. Reported on 05/20/2015    Historical Provider, MD  clindamycin (CLEOCIN) 300 MG capsule Take 1 capsule (300 mg total) by mouth 3 (three) times daily. 11/24/15 12/04/15  Rudene Re, MD  cyclobenzaprine (FLEXERIL) 10 MG tablet Take 10 mg by mouth 3 (three) times daily.     Historical Provider, MD  dexamethasone (DECADRON) 4 MG tablet Take 1 tablet (4 mg total) by mouth daily. 11/12/15   Noreene Filbert, MD  diazepam (VALIUM) 5 MG tablet Take 2 mg by mouth daily.  08/15/14   Historical Provider, MD  diphenhydrAMINE (BENADRYL) 25 MG tablet Take 25 mg by mouth at bedtime.    Historical Provider, MD  DULoxetine (CYMBALTA) 60 MG capsule Take 60  mg by mouth daily.    Historical Provider, MD  estradiol (ESTRACE) 1 MG tablet Take 1 tablet by mouth daily. 10/28/15   Historical Provider, MD  furosemide (LASIX) 40 MG tablet Take 40 mg by mouth.    Historical Provider, MD  gemfibrozil (LOPID) 600 MG tablet Take 600 mg by mouth 2 (two) times daily before a meal.    Historical Provider, MD  glipiZIDE (GLUCOTROL) 5 MG tablet Take 5 mg by mouth 2 (two) times daily before a meal.  06/26/14   Historical Provider, MD  ibuprofen (ADVIL,MOTRIN) 100 MG tablet Take 100 mg by mouth every 6 (six) hours as needed for fever.     Historical Provider, MD  Insulin Glargine (TOUJEO SOLOSTAR) 300 UNIT/ML SOPN Inject 50 Units into the skin as directed.    Historical Provider, MD  levETIRAcetam (KEPPRA) 500 MG tablet Take 1 tablet by mouth 2 (two) times daily. 12/01/15   Historical Provider, MD  LYRICA 150 MG capsule  10/29/15   Historical Provider, MD  metFORMIN (GLUCOPHAGE) 1000 MG tablet Take by mouth. 05/19/15   Historical Provider, MD  oxyCODONE (OXY IR/ROXICODONE) 5 MG immediate release tablet 1-2 tabs every 4 hours as needed for severe pain 11/25/15   Cammie Sickle, MD  pantoprazole (PROTONIX) 40 MG tablet Take by mouth. 10/28/15 10/27/16  Historical Provider, MD  promethazine (PHENERGAN) 12.5 MG tablet Take 1 tablet (12.5 mg total) by mouth every 6 (six) hours as needed for nausea or vomiting. 11/19/15   Cammie Sickle, MD  propranolol (INDERAL) 40 MG tablet Take 40 mg by mouth 2 (two) times daily.    Historical Provider, MD  rosuvastatin (CRESTOR) 10 MG tablet Take 10 mg by mouth daily.    Historical Provider, MD  senna-docusate (SENOKOT-S) 8.6-50 MG tablet Take by mouth. 10/28/15 10/27/16  Historical Provider, MD    Allergies Ampicillin; Penicillins; Avelox [moxifloxacin hcl in nacl]; Metformin; Etodolac; and Neurontin [gabapentin]  Family History  Problem Relation Age of Onset  . Hypertension Brother   . Coronary artery disease Mother   . Diabetes Mellitus II Mother   . Coronary artery disease Father   . Diabetes Mellitus II Father   . Diabetes Mellitus II Brother     Social History Social History  Substance Use Topics  . Smoking status: Current Every Day Smoker    Packs/day: 0.50    Years: 46.00    Types: Cigarettes  . Smokeless tobacco: Never Used  . Alcohol use No    Review of Systems Patient denies headaches, rhinorrhea, blurry vision, numbness, shortness of breath, chest pain, edema, cough, abdominal pain, nausea, vomiting, diarrhea, dysuria, fevers, rashes or hallucinations unless otherwise  stated above in HPI. ____________________________________________   PHYSICAL EXAM:  VITAL SIGNS: Vitals:   12/02/15 2320 12/02/15 2331  BP: 90/64 (!) 88/56  Pulse: 71 73  Resp: (!) 21 15  Temp:  97.3 F (36.3 C)    Constitutional:somnolent, criticilly ill but maintaining her airway Eyes: Conjunctivae are normal. PERRL. EOMI. Head: Atraumatic. Nose: No congestion/rhinnorhea. Mouth/Throat: Mucous membranes are moist.  Oropharynx non-erythematous. Neck: No stridor. Painless ROM. No cervical spine tenderness to palpation Hematological/Lymphatic/Immunilogical: No cervical lymphadenopathy. Cardiovascular: Normal rate, regular rhythm. Grossly normal heart sounds. Slow cap refill Respiratory: Normal respiratory effort.  No retractions. Lungs with coarse, diffuse rhonchi Gastrointestinal: Soft bilateral lower quadrant ttp. No guarding or rebound No distention. No abdominal bruits. No CVA tenderness.   Musculoskeletal: No lower extremity tenderness nor edema.  No joint effusions. Neurologic:  Normal speech and language. No gross focal neurologic deficits are appreciated. No gait instability.  ____________________________________________   LABS (all labs ordered are listed, but only abnormal results are displayed)  Results for orders placed or performed during the hospital encounter of 12/02/15 (from the past 24 hour(s))  Comprehensive metabolic panel     Status: Abnormal   Collection Time: 12/02/15  9:26 PM  Result Value Ref Range   Sodium 135 135 - 145 mmol/L   Potassium 3.8 3.5 - 5.1 mmol/L   Chloride 102 101 - 111 mmol/L   CO2 23 22 - 32 mmol/L   Glucose, Bld 180 (H) 65 - 99 mg/dL   BUN 23 (H) 6 - 20 mg/dL   Creatinine, Ser 1.22 (H) 0.44 - 1.00 mg/dL   Calcium 8.4 (L) 8.9 - 10.3 mg/dL   Total Protein 7.0 6.5 - 8.1 g/dL   Albumin 2.5 (L) 3.5 - 5.0 g/dL   AST 22 15 - 41 U/L   ALT 19 14 - 54 U/L   Alkaline Phosphatase 155 (H) 38 - 126 U/L   Total Bilirubin 0.5 0.3 - 1.2  mg/dL   GFR calc non Af Amer 48 (L) >60 mL/min   GFR calc Af Amer 55 (L) >60 mL/min   Anion gap 10 5 - 15  CBC WITH DIFFERENTIAL     Status: Abnormal   Collection Time: 12/02/15  9:26 PM  Result Value Ref Range   WBC 10.4 3.6 - 11.0 K/uL   RBC 3.65 (L) 3.80 - 5.20 MIL/uL   Hemoglobin 11.1 (L) 12.0 - 16.0 g/dL   HCT 32.7 (L) 35.0 - 47.0 %   MCV 89.6 80.0 - 100.0 fL   MCH 30.3 26.0 - 34.0 pg   MCHC 33.8 32.0 - 36.0 g/dL   RDW 14.5 11.5 - 14.5 %   Platelets 265 150 - 440 K/uL   Neutrophils Relative % 75 %   Neutro Abs 7.7 (H) 1.4 - 6.5 K/uL   Lymphocytes Relative 15 %   Lymphs Abs 1.6 1.0 - 3.6 K/uL   Monocytes Relative 6 %   Monocytes Absolute 0.6 0.2 - 0.9 K/uL   Eosinophils Relative 3 %   Eosinophils Absolute 0.4 0 - 0.7 K/uL   Basophils Relative 1 %   Basophils Absolute 0.1 0 - 0.1 K/uL  Troponin I     Status: None   Collection Time: 12/02/15  9:26 PM  Result Value Ref Range   Troponin I <0.03 <0.03 ng/mL  Brain natriuretic peptide     Status: None   Collection Time: 12/02/15  9:28 PM  Result Value Ref Range   B Natriuretic Peptide 56.0 0.0 - 100.0 pg/mL  Lactic acid, plasma     Status: None   Collection Time: 12/02/15 10:03 PM  Result Value Ref Range   Lactic Acid, Venous 1.6 0.5 - 1.9 mmol/L  Urinalysis complete, with microscopic ([facility] only)     Status: Abnormal   Collection Time: 12/02/15 10:03 PM  Result Value Ref Range   Color, Urine AMBER (A) YELLOW   APPearance HAZY (A) CLEAR   Glucose, UA 50 (A) NEGATIVE mg/dL   Bilirubin Urine NEGATIVE NEGATIVE   Ketones, ur NEGATIVE NEGATIVE mg/dL   Specific Gravity, Urine 1.020 1.005 - 1.030   Hgb urine dipstick 1+ (A) NEGATIVE   pH 5.0 5.0 - 8.0   Protein, ur 100 (A) NEGATIVE mg/dL   Nitrite POSITIVE (A) NEGATIVE   Leukocytes, UA 3+ (A) NEGATIVE   RBC /  HPF 0-5 0 - 5 RBC/hpf   WBC, UA TOO NUMEROUS TO COUNT 0 - 5 WBC/hpf   Bacteria, UA MANY (A) NONE SEEN   Squamous Epithelial / LPF 0-5 (A) NONE SEEN    ____________________________________________  EKG My review and personal interpretation at Time: 21:27   Indication: hypotension  Rate: 80  Rhythm: normal sinus Axis: normal Other: no acutre ST elevations ____________________________________________  RADIOLOGY  CXR my read shows no evidence of acute cardiopulmonary process. CT abd/pelv pending ____________________________________________   PROCEDURES  Procedure(s) performed: yes CENTRAL LINE Performed by: Merlyn Lot Consent: The procedure was performed in an emergent situation. Required items: required blood products, implants, devices, and special equipment available Patient identity confirmed: arm band and provided demographic data Time out: Immediately prior to procedure a "time out" was called to verify the correct patient, procedure, equipment, support staff and site/side marked as required. Indications: vascular access Anesthesia: local infiltration Local anesthetic: lidocaine 1% with epinephrine Anesthetic total: 3 ml Patient sedated: no Preparation: skin prepped with 2% chlorhexidine Skin prep agent dried: skin prep agent completely dried prior to procedure Sterile barriers: all five maximum sterile barriers used - cap, mask, sterile gown, sterile gloves, and large sterile sheet Hand hygiene: hand hygiene performed prior to central venous catheter insertion  Location details: RIJ  Catheter type: triple lumen Catheter size: 8 Fr Pre-procedure: landmarks identified Ultrasound guidance: yes Successful placement: yes Post-procedure: line sutured and dressing applied Assessment: blood return through all parts, free fluid flow, placement verified by x-ray and no pneumothorax on x-ray Patient tolerance: Patient tolerated the procedure well with no immediate complications.     Critical Care performed: yes CRITICAL CARE Performed by: Merlyn Lot   Total critical care time: 45 minutes  Critical care  time was exclusive of separately billable procedures and treating other patients.  Critical care was necessary to treat or prevent imminent or life-threatening deterioration.  Critical care was time spent personally by me on the following activities: development of treatment plan with patient and/or surrogate as well as nursing, discussions with consultants, evaluation of patient's response to treatment, examination of patient, obtaining history from patient or surrogate, ordering and performing treatments and interventions, ordering and review of laboratory studies, ordering and review of radiographic studies, pulse oximetry and re-evaluation of patient's condition.  ____________________________________________   INITIAL IMPRESSION / ASSESSMENT AND PLAN / ED COURSE  Pertinent labs & imaging results that were available during my care of the patient were reviewed by me and considered in my medical decision making (see chart for details).  DDX: Severe sepsis, hypoglycemia, electrolyte abnormality, seizure, UTI, appendicitis, aspiration  GARIMA CHRONIS is a 60 y.o. who presents to the ED critically ill with altered mental status, acute hypoxic respiratory failure and acute hypotension with fever. The patient immediately evaluated upon arrival to the ER and is protecting her airway. Based on presentation I'm concerned for sepsis. Broad spectrum and I'll ask ordered empirically. Laboratory evaluation will be sent. Patient will be kept on the monitor. Patient will be provided IV fluid resuscitation however I will not be ordering 30 cc per KG due to her history of heart failure.  Clinical Course  Comment By Time  Patient now with a stabilized blood pressure that she is developing persistent hypoxia. Central line placed due to persistent hypotension and need for pressor support given her congestive heart failure. At this point I suspect her severe sepsis is secondary to urinary tract infection.  Postcentral line placement chest x-ray does  show interval development of pulmonary edema. Patient is maintaining normal saturations on nasal cannula at this time. Spoke with Dr. Jannifer Franklin who kindly agrees to admit patient to ICU setting for further evaluation and management. Patient remains in critical condition. Merlyn Lot, MD 08/23 2344     ____________________________________________   FINAL CLINICAL IMPRESSION(S) / ED DIAGNOSES  Final diagnoses:  Severe sepsis ([place])  Acute respiratory failure with hypoxia ([facility])  UTI (lower urinary tract infection)      NEW MEDICATIONS STARTED DURING THIS VISIT:  New Prescriptions   No medications on file     Note:  This document was prepared using Dragon voice recognition software and may include unintentional dictation errors.    Merlyn Lot, MD 12/02/15 [phone]

## 2015-12-02 NOTE — ED Notes (Signed)
Admitting MD at bedside.

## 2015-12-03 ENCOUNTER — Ambulatory Visit: Payer: Medicare Other

## 2015-12-03 ENCOUNTER — Inpatient Hospital Stay
Admit: 2015-12-03 | Discharge: 2015-12-03 | Disposition: A | Discharge status: Home / Self Care | Payer: Medicare Other | Attending: Internal Medicine | Admitting: Internal Medicine

## 2015-12-03 DIAGNOSIS — A419 Sepsis, unspecified organism: Secondary | ICD-10-CM

## 2015-12-03 DIAGNOSIS — R6520 Severe sepsis without septic shock: Secondary | ICD-10-CM

## 2015-12-03 LAB — CBC WITH DIFFERENTIAL/PLATELET
Basophils Absolute: 0.1 10*3/uL (ref 0–0.1)
Basophils Relative: 1 %
Eosinophils Absolute: 0.4 10*3/uL (ref 0–0.7)
Eosinophils Relative: 4 %
HEMATOCRIT: 30.7 % — AB (ref 35.0–47.0)
HEMOGLOBIN: 10.4 g/dL — AB (ref 12.0–16.0)
LYMPHS ABS: 1.4 10*3/uL (ref 1.0–3.6)
Lymphocytes Relative: 12 %
MCH: 30.5 pg (ref 26.0–34.0)
MCHC: 33.8 g/dL (ref 32.0–36.0)
MCV: 90.3 fL (ref 80.0–100.0)
MONOS PCT: 6 %
Monocytes Absolute: 0.7 10*3/uL (ref 0.2–0.9)
NEUTROS ABS: 9.1 10*3/uL — AB (ref 1.4–6.5)
NEUTROS PCT: 77 %
Platelets: 260 10*3/uL (ref 150–440)
RBC: 3.4 MIL/uL — ABNORMAL LOW (ref 3.80–5.20)
RDW: 14.3 % (ref 11.5–14.5)
WBC: 11.7 10*3/uL — ABNORMAL HIGH (ref 3.6–11.0)

## 2015-12-03 LAB — GLUCOSE, CAPILLARY
GLUCOSE-CAPILLARY: 163 mg/dL — AB (ref 65–99)
Glucose-Capillary: 249 mg/dL — ABNORMAL HIGH (ref 65–99)
Glucose-Capillary: 308 mg/dL — ABNORMAL HIGH (ref 65–99)
Glucose-Capillary: 162 mg/dL — ABNORMAL HIGH (ref 65–99)

## 2015-12-03 LAB — BASIC METABOLIC PANEL
ANION GAP: 11 (ref 5–15)
BUN: 19 mg/dL (ref 6–20)
CHLORIDE: 104 mmol/L (ref 101–111)
CO2: 22 mmol/L (ref 22–32)
Calcium: 8.1 mg/dL — ABNORMAL LOW (ref 8.9–10.3)
Creatinine, Ser: 0.81 mg/dL (ref 0.44–1.00)
GFR calc Af Amer: >60 mL/min (ref >60)
GLUCOSE: 149 mg/dL — AB (ref 65–99)
POTASSIUM: 3.9 mmol/L (ref 3.5–5.1)
Sodium: 137 mmol/L (ref 135–145)

## 2015-12-03 LAB — ECHOCARDIOGRAM COMPLETE
Height: 65 in
WEIGHTICAEL: 3168 [oz_av]

## 2015-12-03 LAB — LACTIC ACID, PLASMA: Lactic Acid, Venous: 1.1 mmol/L (ref 0.5–1.9)

## 2015-12-03 LAB — HEMOGLOBIN A1C: HEMOGLOBIN A1C: 11.4 % — AB (ref 4.0–6.0)

## 2015-12-03 LAB — MRSA PCR SCREENING: MRSA BY PCR: NEGATIVE

## 2015-12-03 MED ORDER — LEVOFLOXACIN IN D5W 750 MG/150ML IV SOLN
750.0000 mg | INTRAVENOUS | Status: DC
  Administered 2015-12-03: 750 mg via INTRAVENOUS
  Filled 2015-12-03 (×2): qty 150

## 2015-12-03 MED ORDER — SODIUM CHLORIDE 0.9 % IV SOLN
INTRAVENOUS | Status: DC
  Administered 2015-12-03 – 2015-12-05 (×5): via INTRAVENOUS

## 2015-12-03 MED ORDER — ACETAMINOPHEN 650 MG RE SUPP: 650.0000 mg | Freq: Four times a day (QID) | RECTAL | Status: DC | PRN

## 2015-12-03 MED ORDER — ALPRAZOLAM 0.25 MG PO TABS
0.2500 mg | ORAL_TABLET | Freq: Two times a day (BID) | ORAL | Status: DC
  Administered 2015-12-03 – 2015-12-06 (×8): 0.25 mg via ORAL
  Filled 2015-12-03 (×8): qty 1

## 2015-12-03 MED ORDER — INSULIN ASPART 100 UNIT/ML ~~LOC~~ SOLN
0.0000 [IU] | Freq: Every day | SUBCUTANEOUS | Status: DC
  Administered 2015-12-05: 2 [IU] via SUBCUTANEOUS
  Filled 2015-12-03: qty 2

## 2015-12-03 MED ORDER — ACETAMINOPHEN 325 MG PO TABS
650.0000 mg | ORAL_TABLET | Freq: Four times a day (QID) | ORAL | Status: DC | PRN
  Administered 2015-12-04 (×2): 650 mg via ORAL
  Filled 2015-12-03 (×2): qty 2

## 2015-12-03 MED ORDER — DEXTROSE 5 % IV SOLN
2.0000 g | Freq: Three times a day (TID) | INTRAVENOUS | Status: DC
  Administered 2015-12-03: 2 g via INTRAVENOUS
  Filled 2015-12-03 (×3): qty 2

## 2015-12-03 MED ORDER — LEVETIRACETAM 500 MG PO TABS
500.0000 mg | ORAL_TABLET | Freq: Two times a day (BID) | ORAL | Status: DC
  Administered 2015-12-03 – 2015-12-07 (×9): 500 mg via ORAL
  Filled 2015-12-03 (×9): qty 1

## 2015-12-03 MED ORDER — MOMETASONE FURO-FORMOTEROL FUM 200-5 MCG/ACT IN AERO
2.0000 | INHALATION_SPRAY | Freq: Two times a day (BID) | RESPIRATORY_TRACT | Status: DC
  Administered 2015-12-03 – 2015-12-07 (×9): 2 via RESPIRATORY_TRACT
  Filled 2015-12-03: qty 8.8

## 2015-12-03 MED ORDER — DULOXETINE HCL 60 MG PO CPEP
60.0000 mg | ORAL_CAPSULE | Freq: Every day | ORAL | Status: DC
  Administered 2015-12-03 – 2015-12-07 (×5): 60 mg via ORAL
  Filled 2015-12-03 (×2): qty 1
  Filled 2015-12-03: qty 2
  Filled 2015-12-03: qty 1
  Filled 2015-12-03: qty 2

## 2015-12-03 MED ORDER — ONDANSETRON HCL 4 MG/2ML IJ SOLN
4.0000 mg | Freq: Four times a day (QID) | INTRAMUSCULAR | Status: DC | PRN
  Administered 2015-12-06 – 2015-12-07 (×2): 4 mg via INTRAVENOUS
  Filled 2015-12-03 (×3): qty 2

## 2015-12-03 MED ORDER — CETYLPYRIDINIUM CHLORIDE 0.05 % MT LIQD
7.0000 mL | Freq: Two times a day (BID) | OROMUCOSAL | Status: DC
  Administered 2015-12-04 – 2015-12-07 (×3): 7 mL via OROMUCOSAL

## 2015-12-03 MED ORDER — ENOXAPARIN SODIUM 40 MG/0.4ML ~~LOC~~ SOLN
40.0000 mg | SUBCUTANEOUS | Status: DC
  Administered 2015-12-03 – 2015-12-06 (×4): 40 mg via SUBCUTANEOUS
  Filled 2015-12-03 (×4): qty 0.4

## 2015-12-03 MED ORDER — OXYCODONE HCL 5 MG PO TABS
10.0000 mg | ORAL_TABLET | ORAL | Status: DC | PRN
  Administered 2015-12-03 – 2015-12-07 (×16): 10 mg via ORAL
  Filled 2015-12-03 (×16): qty 2

## 2015-12-03 MED ORDER — VANCOMYCIN HCL IN DEXTROSE 1-5 GM/200ML-% IV SOLN
1000.0000 mg | INTRAVENOUS | Status: DC
  Administered 2015-12-03: 1000 mg via INTRAVENOUS
  Filled 2015-12-03 (×2): qty 200

## 2015-12-03 MED ORDER — SODIUM CHLORIDE 0.9 % IV BOLUS (SEPSIS)
1500.0000 mL | Freq: Once | INTRAVENOUS | Status: AC
  Administered 2015-12-03: 1500 mL via INTRAVENOUS

## 2015-12-03 MED ORDER — SODIUM CHLORIDE 0.9% FLUSH
3.0000 mL | Freq: Two times a day (BID) | INTRAVENOUS | Status: DC
  Administered 2015-12-03 – 2015-12-07 (×9): 3 mL via INTRAVENOUS

## 2015-12-03 MED ORDER — ROSUVASTATIN CALCIUM 20 MG PO TABS
10.0000 mg | ORAL_TABLET | Freq: Every day | ORAL | Status: DC
  Administered 2015-12-03 – 2015-12-07 (×5): 10 mg via ORAL
  Filled 2015-12-03 (×6): qty 1

## 2015-12-03 MED ORDER — NOREPINEPHRINE 4 MG/250ML-% IV SOLN: 0.0000 ug/min | INTRAVENOUS | Status: DC

## 2015-12-03 MED ORDER — FAMOTIDINE 20 MG PO TABS
20.0000 mg | ORAL_TABLET | Freq: Every day | ORAL | Status: DC
  Administered 2015-12-03 – 2015-12-07 (×5): 20 mg via ORAL
  Filled 2015-12-03 (×5): qty 1

## 2015-12-03 MED ORDER — INSULIN ASPART 100 UNIT/ML ~~LOC~~ SOLN
0.0000 [IU] | Freq: Three times a day (TID) | SUBCUTANEOUS | Status: DC
  Administered 2015-12-03: 3 [IU] via SUBCUTANEOUS
  Administered 2015-12-03: 7 [IU] via SUBCUTANEOUS
  Administered 2015-12-04: 2 [IU] via SUBCUTANEOUS
  Administered 2015-12-04: 3 [IU] via SUBCUTANEOUS
  Administered 2015-12-04: 18:00:00 5 [IU] via SUBCUTANEOUS
  Administered 2015-12-05 (×2): 2 [IU] via SUBCUTANEOUS
  Administered 2015-12-05: 3 [IU] via SUBCUTANEOUS
  Administered 2015-12-06 (×2): 2 [IU] via SUBCUTANEOUS
  Administered 2015-12-06: 12:00:00 3 [IU] via SUBCUTANEOUS
  Administered 2015-12-07: 12:00:00 2 [IU] via SUBCUTANEOUS
  Administered 2015-12-07: 3 [IU] via SUBCUTANEOUS
  Filled 2015-12-03: qty 3
  Filled 2015-12-03: qty 2
  Filled 2015-12-03: qty 3
  Filled 2015-12-03: qty 7
  Filled 2015-12-03: qty 2
  Filled 2015-12-03 (×2): qty 3
  Filled 2015-12-03: qty 2
  Filled 2015-12-03: qty 5
  Filled 2015-12-03: qty 3
  Filled 2015-12-03 (×2): qty 2
  Filled 2015-12-03: qty 3

## 2015-12-03 MED ORDER — PANTOPRAZOLE SODIUM 40 MG PO TBEC
40.0000 mg | DELAYED_RELEASE_TABLET | Freq: Every day | ORAL | Status: DC
Start: 2015-12-03 — End: 2015-12-03
  Administered 2015-12-03: 40 mg via ORAL
  Filled 2015-12-03: qty 1

## 2015-12-03 MED ORDER — ONDANSETRON HCL 4 MG PO TABS
4.0000 mg | ORAL_TABLET | Freq: Four times a day (QID) | ORAL | Status: DC | PRN
  Administered 2015-12-03 – 2015-12-06 (×3): 4 mg via ORAL
  Filled 2015-12-03 (×3): qty 1

## 2015-12-03 MED ORDER — AZTREONAM 2 G IJ SOLR
2.0000 g | Freq: Three times a day (TID) | INTRAMUSCULAR | Status: DC
  Administered 2015-12-03 – 2015-12-05 (×6): 2 g via INTRAVENOUS
  Filled 2015-12-03 (×10): qty 2

## 2015-12-03 MED ORDER — NOREPINEPHRINE 4 MG/250ML-% IV SOLN
4.0000 ug/min | Freq: Once | INTRAVENOUS | Status: AC
  Administered 2015-12-03: 0.004 mg/min via INTRAVENOUS

## 2015-12-03 NOTE — ED Notes (Signed)
Per [place] RN in ICU patient is being re-evaluated by ICU Team, due to Blood Pressure that is why they haven't accepted Bed

## 2015-12-03 NOTE — Plan of Care (Signed)
Problem: Safety: Goal: Ability to remain free from injury will improve Outcome: Progressing Patient has remained free of injury.  Problem: Skin Integrity: Goal: Risk for impaired skin integrity will decrease Outcome: Progressing Patient able to move about the bed and reposition when needed.  Problem: Nutrition: Goal: Adequate nutrition will be maintained Outcome: Progressing Patient having adequate intake of nutrition.  Problem: Fluid Volume: Goal: Hemodynamic stability will improve Outcome: Progressing Patient progressing towards hemodynamic stability.  Problem: Respiratory: Goal: Ability to maintain adequate ventilation will improve Outcome: Progressing Patient maintaining adequate oxygenation on 2L [place].

## 2015-12-03 NOTE — H&P (Addendum)
[facility] [place] NAME: Marie [name]    MR#:  [id number]  DATE OF BIRTH:  October 25, 1955  DATE OF ADMISSION:  12/02/2015  PRIMARY CARE PHYSICIAN: Cletis Athens, MD   REQUESTING/REFERRING PHYSICIAN: Quentin Cornwall, MD  CHIEF COMPLAINT:   Chief Complaint  Patient presents with  . Hypotension  . Abdominal Pain    lower    HISTORY OF PRESENT ILLNESS:  Marie [name]  is a 60 y.o. female who presents with Several days dysuria followed by low blood pressure and mental status change. She was brought to the ED for evaluation where she was found to be hypotensive. Initial workup showed urinary source with criteria for severe sepsis. Patient had a CT abdomen as part of her workup which also suggested possible right-sided pneumonia, however this was after she received significant fluids and had a change on repeat x-ray showing pulmonary edema.  IV antibiotics were initiated, culture sent, hospitalist called for admission.  PAST MEDICAL HISTORY:   Past Medical History:  Diagnosis Date  . Arthritis   . Cancer ([place])    Lung CA right wedge removed  . CHF (congestive heart failure) ([place])   . COPD (chronic obstructive pulmonary disease) (H[facility])    has oxygen but doesn't use  . Diabetes mellitus type 2 with neurological manifestations ([place])    Peripheral neuropathy and foot ulcers  . Hypertension   . PAD (peripheral artery disease) ([place])   . Peripheral neuropathy ([place])   . Shortness of breath dyspnea     PAST SURGICAL HISTORY:   Past Surgical History:  Procedure Laterality Date  . ABDOMINAL HYSTERECTOMY    . BACK SURGERY  x 7  . CARPAL TUNNEL RELEASE Bilateral   . CESAREAN SECTION     x3  . CORONARY ANGIOPLASTY     no stents  . FOOT SURGERY Left    diabetic ulcer  . KNEE ARTHROSCOPY Bilateral   . TONSILLECTOMY    . VIDEO ASSISTED THORACOSCOPY (VATS)/THOROCOTOMY Right 08/26/2014   Procedure: VIDEO ASSISTED THORACOSCOPY (VATS)/THOROCOTOMY;   Surgeon: Nestor Lewandowsky, MD;  Location: [facility] ORS;  Service: General;  Laterality: Right;    SOCIAL HISTORY:   Social History  Substance Use Topics  . Smoking status: Current Every Day Smoker    Packs/day: 0.50    Years: 46.00    Types: Cigarettes  . Smokeless tobacco: Never Used  . Alcohol use No    FAMILY HISTORY:   Family History  Problem Relation Age of Onset  . Hypertension Brother   . Coronary artery disease Mother   . Diabetes Mellitus II Mother   . Coronary artery disease Father   . Diabetes Mellitus II Father   . Diabetes Mellitus II Brother     DRUG ALLERGIES:   Allergies  Allergen Reactions  . Ampicillin Anaphylaxis  . Penicillins Anaphylaxis and Shortness Of Breath    Has patient had a PCN reaction causing immediate rash, facial/tongue/throat swelling, SOB or lightheadedness with hypotension: yes Has patient had a PCN reaction causing severe rash involving mucus membranes or skin necrosis: no Has patient had a PCN reaction that required hospitalization no Has patient had a PCN reaction occurring within the last 10 years: no If all of the above answers are "NO", then may proceed with Cephalosporin use.   . Avelox [Moxifloxacin Hcl In Nacl] Other (See Comments)    Reaction: MUSCLE CRAMPS  . Metformin     Other reaction(s): Other (See Comments) NUMB MOUTH AND FACE  .  Etodolac Rash  . Neurontin [Gabapentin] Anxiety    MEDICATIONS AT HOME:   Prior to Admission medications   Medication Sig Start Date End Date Taking? Authorizing Provider  ALPRAZolam (XANAX) 0.25 MG tablet Take 1 tablet (0.25 mg total) by mouth 2 (two) times daily. 11/18/15   Cammie Sickle, MD  budesonide-formoterol (SYMBICORT) 160-4.5 MCG/ACT inhaler Inhale 2 puffs into the lungs 2 (two) times daily. Reported on 05/20/2015    Historical Provider, MD  clindamycin (CLEOCIN) 300 MG capsule Take 1 capsule (300 mg total) by mouth 3 (three) times daily. 11/24/15 12/04/15  Rudene Re, MD   cyclobenzaprine (FLEXERIL) 10 MG tablet Take 10 mg by mouth 3 (three) times daily.     Historical Provider, MD  dexamethasone (DECADRON) 4 MG tablet Take 1 tablet (4 mg total) by mouth daily. 11/12/15   Noreene Filbert, MD  diazepam (VALIUM) 5 MG tablet Take 2 mg by mouth daily.  08/15/14   Historical Provider, MD  diphenhydrAMINE (BENADRYL) 25 MG tablet Take 25 mg by mouth at bedtime.    Historical Provider, MD  DULoxetine (CYMBALTA) 60 MG capsule Take 60 mg by mouth daily.    Historical Provider, MD  estradiol (ESTRACE) 1 MG tablet Take 1 tablet by mouth daily. 10/28/15   Historical Provider, MD  furosemide (LASIX) 40 MG tablet Take 40 mg by mouth.    Historical Provider, MD  gemfibrozil (LOPID) 600 MG tablet Take 600 mg by mouth 2 (two) times daily before a meal.    Historical Provider, MD  glipiZIDE (GLUCOTROL) 5 MG tablet Take 5 mg by mouth 2 (two) times daily before a meal.  06/26/14   Historical Provider, MD  ibuprofen (ADVIL,MOTRIN) 100 MG tablet Take 100 mg by mouth every 6 (six) hours as needed for fever.    Historical Provider, MD  Insulin Glargine (TOUJEO SOLOSTAR) 300 UNIT/ML SOPN Inject 50 Units into the skin as directed.    Historical Provider, MD  levETIRAcetam (KEPPRA) 500 MG tablet Take 1 tablet by mouth 2 (two) times daily. 12/01/15   Historical Provider, MD  LYRICA 150 MG capsule  10/29/15   Historical Provider, MD  metFORMIN (GLUCOPHAGE) 1000 MG tablet Take by mouth. 05/19/15   Historical Provider, MD  oxyCODONE (OXY IR/ROXICODONE) 5 MG immediate release tablet 1-2 tabs every 4 hours as needed for severe pain 11/25/15   Cammie Sickle, MD  pantoprazole (PROTONIX) 40 MG tablet Take by mouth. 10/28/15 10/27/16  Historical Provider, MD  promethazine (PHENERGAN) 12.5 MG tablet Take 1 tablet (12.5 mg total) by mouth every 6 (six) hours as needed for nausea or vomiting. 11/19/15   Cammie Sickle, MD  propranolol (INDERAL) 40 MG tablet Take 40 mg by mouth 2 (two) times daily.     Historical Provider, MD  rosuvastatin (CRESTOR) 10 MG tablet Take 10 mg by mouth daily.    Historical Provider, MD  senna-docusate (SENOKOT-S) 8.6-50 MG tablet Take by mouth. 10/28/15 10/27/16  Historical Provider, MD    REVIEW OF SYSTEMS:  Review of Systems  Constitutional: Negative for chills, fever, malaise/fatigue and weight loss.  HENT: Negative for ear pain, hearing loss and tinnitus.   Eyes: Negative for blurred vision, double vision, pain and redness.  Respiratory: Negative for cough, hemoptysis and shortness of breath.   Cardiovascular: Negative for chest pain, palpitations, orthopnea and leg swelling.  Gastrointestinal: Negative for abdominal pain, constipation, diarrhea, nausea and vomiting.  Genitourinary: Negative for dysuria, frequency and hematuria.  Musculoskeletal: Negative for back pain, joint  pain and neck pain.  Skin:       No acne, rash, or lesions  Neurological: Negative for dizziness, tremors, focal weakness and weakness.  Endo/Heme/Allergies: Negative for polydipsia. Does not bruise/bleed easily.  Psychiatric/Behavioral: Negative for depression. The patient is not nervous/anxious and does not have insomnia.      VITAL SIGNS:   Vitals:   12/02/15 2320 12/02/15 2331 12/03/15 0001 12/03/15 0002  BP: 90/64 (!) 88/56 102/60   Pulse: 71 73 76 77  Resp: (!) '21 15 18 19  '$ Temp:  97.3 F (36.3 C)    TempSrc:  Rectal    SpO2: 99% 98% 96% 99%  Weight:      Height:       Wt Readings from Last 3 Encounters:  12/02/15 86.2 kg (190 lb)  11/24/15 89.8 kg (198 lb)  11/18/15 93.4 kg (206 lb)    PHYSICAL EXAMINATION:  Physical Exam  LABORATORY PANEL:   CBC  Recent Labs Lab 12/02/15 2126  WBC 10.4  HGB 11.1*  HCT 32.7*  PLT 265   ------------------------------------------------------------------------------------------------------------------  Chemistries   Recent Labs Lab 12/02/15 2126  NA 135  K 3.8  CL 102  CO2 23  GLUCOSE 180*  BUN 23*   CREATININE 1.22*  CALCIUM 8.4*  AST 22  ALT 19  ALKPHOS 155*  BILITOT 0.5   ------------------------------------------------------------------------------------------------------------------  Cardiac Enzymes  Recent Labs Lab 12/02/15 2126  TROPONINI <0.03   ------------------------------------------------------------------------------------------------------------------  RADIOLOGY:  Dg Chest 1 View  Result Date: 12/02/2015 CLINICAL DATA:  Code sepsis.  Lung cancer and altered mental status EXAM: CHEST 1 VIEW COMPARISON:  06/05/2015 FINDINGS: Chronic hazy appearance at the right base where there are lung sutures. Scar in this region on 2016 chest CT. Mild increase in left basilar markings which was also seen previously. No cardiomegaly for technique. Negative aortic and hilar contours. IMPRESSION: Stable compared to prior, including postoperative changes in the right lower lobe. No convincing pneumonia. Electronically Signed   By: Monte Fantasia M.D.   On: 12/02/2015 21:49   Dg Chest Portable 1 View  Result Date: 12/02/2015 CLINICAL DATA:  Central line placement EXAM: PORTABLE CHEST 1 VIEW COMPARISON:  Earlier today FINDINGS: New right IJ central line with tip at the SVC level. No pneumothorax or new mediastinal widening. New bilateral interstitial prominence suggesting edema. Normal heart size and stable mediastinal contours. Postoperative changes in the right lower lobe are becoming obscured. IMPRESSION: 1. New central line without adverse finding. 2. New pulmonary edema. Electronically Signed   By: Monte Fantasia M.D.   On: 12/02/2015 23:37    EKG:   Orders placed or performed during the hospital encounter of 12/02/15  . ED EKG 12-Lead  . ED EKG 12-Lead    IMPRESSION AND PLAN:  Principal Problem:   Severe sepsis ([place]) - patient presents a significant UTI, hypotension, acute kidney injury. IV antibiotics started in the ED and continued on admission, lactic acid was normal  for the patient is clinically dehydrated. Fluid resuscitation given, but patient with a history of heart failure, though without significant clinical signs and a normal BNP, did develop opacity and her repeat chest x-ray after fluid administration consistent with pulmonary edema. Fluids were stopped. Blood pressure is borderline, but map has been 65. We will initiate pressors if her map drops below 65. Cultures sent from the ED Active Problems:   Acute encephalopathy - patient's mental status improved with fluid administration and antibiotics in the ED. We'll continue to monitor closely  AKI (acute kidney injury) ([place]) - fluid resuscitation given, and we will recheck this level in the morning.   UTI (lower urinary tract infection) - IV antibiotics and cultures as above   Poorly controlled diabetes mellitus (HCC) - sliding scale insulin with carb modified diet   Malignant neoplasm of lower lobe of right lung (HCC) - CT scan questions pneumonia versus possible metastatic spread. Might consider oncology consult, though this is certainly not the primary reason for the patient's admission here.   History of CHF - chronic in acuity, clinically undetermined type as I can not find echocardiogram in patient's chart on chart review.  Not currently in exacerbation.  All the records are reviewed and case discussed with ED provider. Management plans discussed with the patient and/or family.  DVT PROPHYLAXIS: SubQ lovenox  GI PROPHYLAXIS: PPI  ADMISSION STATUS: Inpatient  CODE STATUS: Full Code Status History    Date Active Date Inactive Code Status Order ID Comments User Context   11/01/2014  1:23 AM 11/02/2014  5:08 PM Full Code [id number]  Harrie Foreman, MD Inpatient   08/26/2014 12:53 PM 09/02/2014  6:53 PM Full Code [id number]  Nestor Lewandowsky, MD Inpatient      TOTAL TIME TAKING CARE OF THIS PATIENT: 45 minutes.    Mathew Postiglione DeRidder 12/03/2015, 12:18 AM  [facility]  Office   [phone]  CC: Primary care physician; Cletis Athens, MD

## 2015-12-03 NOTE — ED Notes (Signed)
Dr. Jannifer Franklin aware of pulmonary edema ordered to stop NS 137m and start levophed if MAP <65. Will continue to monitor.

## 2015-12-03 NOTE — Progress Notes (Signed)
Upon initial assessment, I found a healing diabetic ulcer as well as an open diabetic ulcer on patients left foot. MD Ram made aware, WOCN consult placed.

## 2015-12-03 NOTE — Progress Notes (Signed)
With multiple unsuccessful attempts to contact MD Vianne Bulls, requested from MD Ram that foley catheter be placed and made aware of levophed order not being in [facility] correctly. See new orders, will continue to monitor patient.

## 2015-12-03 NOTE — ED Notes (Signed)
Pt attempting to urinate again unsuccessful.

## 2015-12-03 NOTE — ED Notes (Signed)
Pt back from CT

## 2015-12-03 NOTE — ED Notes (Signed)
Report called to nurse on 2A. 

## 2015-12-03 NOTE — Progress Notes (Signed)
[facility] at [place] NAME: Marie [name]    MR#:  [id number]  DATE OF BIRTH:  September 27, 1955  SUBJECTIVE:  CHIEF COMPLAINT:   Chief Complaint  Patient presents with  . Hypotension  . Abdominal Pain    lower   Feels bad. Has nausea and abdominal pain. Continues to be on pressors  REVIEW OF SYSTEMS:    Review of Systems  Constitutional: Positive for malaise/fatigue. Negative for chills and fever.  HENT: Negative for sore throat.   Eyes: Negative for blurred vision, double vision and pain.  Respiratory: Positive for cough and shortness of breath. Negative for hemoptysis and wheezing.   Cardiovascular: Negative for chest pain, palpitations, orthopnea and leg swelling.  Gastrointestinal: Positive for abdominal pain and nausea. Negative for constipation, diarrhea, heartburn and vomiting.  Genitourinary: Negative for dysuria and hematuria.  Musculoskeletal: Negative for back pain and joint pain.  Skin: Negative for rash.  Neurological: Positive for dizziness and weakness. Negative for sensory change, speech change, focal weakness and headaches.  Endo/Heme/Allergies: Does not bruise/bleed easily.  Psychiatric/Behavioral: Negative for depression. The patient is not nervous/anxious.     DRUG ALLERGIES:   Allergies  Allergen Reactions  . Ampicillin Anaphylaxis  . Penicillins Anaphylaxis and Shortness Of Breath    Has patient had a PCN reaction causing immediate rash, facial/tongue/throat swelling, SOB or lightheadedness with hypotension: yes Has patient had a PCN reaction causing severe rash involving mucus membranes or skin necrosis: no Has patient had a PCN reaction that required hospitalization no Has patient had a PCN reaction occurring within the last 10 years: no If all of the above answers are "NO", then may proceed with Cephalosporin use.   . Avelox [Moxifloxacin Hcl In Nacl] Other (See Comments)    Reaction: MUSCLE CRAMPS  .  Etodolac Rash  . Neurontin [Gabapentin] Anxiety    VITALS:  Blood pressure 98/61, pulse 100, temperature 97.7 F (36.5 C), temperature source Oral, resp. rate (!) 21, height '5\' 5"'$  (1.651 m), weight 89.8 kg (198 lb), SpO2 100 %.  PHYSICAL EXAMINATION:   Physical Exam  GENERAL:  60 y.o.-year-old patient lying in the bed with no acute distress.  EYES: Pupils equal, round, reactive to light and accommodation. No scleral icterus. Extraocular muscles intact.  HEENT: Head atraumatic, normocephalic. Oropharynx and nasopharynx clear.  NECK:  Supple, no jugular venous distention. No thyroid enlargement, no tenderness.  LUNGS: Normal breath sounds bilaterally, no wheezing, rales, rhonchi. No use of accessory muscles of respiration.  CARDIOVASCULAR: S1, S2 normal. No murmurs, rubs, or gallops.  ABDOMEN: Soft, nontender, nondistended. Bowel sounds present. No organomegaly or mass.  EXTREMITIES: No cyanosis, clubbing or edema b/l.    NEUROLOGIC: Cranial nerves II through XII are intact. No focal Motor or sensory deficits b/l.   PSYCHIATRIC: The patient is alert and oriented x 3.  SKIN: No obvious rash, lesion, or ulcer.   LABORATORY PANEL:   CBC  Recent Labs Lab 12/03/15 0842  WBC 11.7*  HGB 10.4*  HCT 30.7*  PLT 260   ------------------------------------------------------------------------------------------------------------------ Chemistries   Recent Labs Lab 12/02/15 2126 12/03/15 0842  NA 135 137  K 3.8 3.9  CL 102 104  CO2 23 22  GLUCOSE 180* 149*  BUN 23* 19  CREATININE 1.22* 0.81  CALCIUM 8.4* 8.1*  AST 22  --   ALT 19  --   ALKPHOS 155*  --   BILITOT 0.5  --    ------------------------------------------------------------------------------------------------------------------  Cardiac Enzymes  Recent Labs Lab 12/02/15 2126  TROPONINI <0.03    ------------------------------------------------------------------------------------------------------------------  RADIOLOGY:  Dg Chest 1 View  Result Date: 12/02/2015 CLINICAL DATA:  Code sepsis.  Lung cancer and altered mental status EXAM: CHEST 1 VIEW COMPARISON:  06/05/2015 FINDINGS: Chronic hazy appearance at the right base where there are lung sutures. Scar in this region on 2016 chest CT. Mild increase in left basilar markings which was also seen previously. No cardiomegaly for technique. Negative aortic and hilar contours. IMPRESSION: Stable compared to prior, including postoperative changes in the right lower lobe. No convincing pneumonia. Electronically Signed   By: Monte Fantasia M.D.   On: 12/02/2015 21:49   Ct Abdomen Pelvis W Contrast  Result Date: 12/03/2015 CLINICAL DATA:  Severe sepsis. Right lower quadrant abdominal pain. Lung cancer. EXAM: CT ABDOMEN AND PELVIS WITH CONTRAST TECHNIQUE: Multidetector CT imaging of the abdomen and pelvis was performed using the standard protocol following bolus administration of intravenous contrast. CONTRAST:  154m ISOVUE-300 IOPAMIDOL (ISOVUE-300) INJECTION 61% COMPARISON:  Head CT 07/09/2014.  Chest CT 09/25/2014 FINDINGS: Lower chest and abdominal wall: Partly seen ill-defined airspace opacity in the right middle lobe. There are new pulmonary nodules in the bibasilar lungs, largest in the right posterior costophrenic sulcus measuring 12 mm. Per report in care everywhere 10/23/2015 these are new/progressed. Septal thickening consistent with edema. Hepatobiliary: Numerous low-density liver masses that are new compared to chest CT comparison. These measure up to 3 cm in size in multiple segments. Although low-density they are likely solid. Multi focal abscess considered unlikely.Cholecystectomy. Normal common bile duct diameter. Pancreas: Unremarkable. Spleen: Unremarkable. Adrenals/Urinary Tract: Negative adrenals. Patchy heterogeneous hypo  enhancement in the kidneys having ill-defined masslike appearance. Pyelonephritis is considered given the clinical circumstances, but metastases are favored. Unremarkable bladder. Stomach/Bowel:  No obstruction. No appendicitis. Reproductive:Hysterectomy. Possible right oophorectomy. Negative left ovary. Vascular/Lymphatic: No acute vascular abnormality. Mild enlargement of lymph nodes in the deep liver drainage. Other: No ascites or pneumoperitoneum. Musculoskeletal: No acute or aggressive finding. L3-4 and L4-5 discectomy with solid bony fusion. IMPRESSION: 1. Partly seen right middle lobe pneumonia. Nodules in the bilateral lungs are primarily concerning for metastatic foci, but hematogenous infection is not excluded as these have rapidly developed compared to 10/23/2015 chest CT report. 2. Pulmonary edema. 3. Abnormal bilateral renal perfusion which could be pyelonephritis or metastases. Correlate with urinalysis and follow-up. 4. Numerous hepatic metastases. Electronically Signed   By: JMonte FantasiaM.D.   On: 12/03/2015 00:23   Dg Chest Portable 1 View  Result Date: 12/02/2015 CLINICAL DATA:  Central line placement EXAM: PORTABLE CHEST 1 VIEW COMPARISON:  Earlier today FINDINGS: New right IJ central line with tip at the SVC level. No pneumothorax or new mediastinal widening. New bilateral interstitial prominence suggesting edema. Normal heart size and stable mediastinal contours. Postoperative changes in the right lower lobe are becoming obscured. IMPRESSION: 1. New central line without adverse finding. 2. New pulmonary edema. Electronically Signed   By: JMonte FantasiaM.D.   On: 12/02/2015 23:37     ASSESSMENT AND PLAN:   * Septic Shock due to UTI and right pneumonia Continue IV pressors. Fluid bolus. On broad-spectrum IV antibiotics. Map greater than 62Appreciate intensivist help  * Right-sided pneumonia with malignant lung cancer Cultures pending Continue oxygen and wean as  tolerated Nebs when necessary  * UTI Wait for final urine culture results.  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 Discussed with Dr. BTish Men No  chemotherapy while inpatient.  * Diabetes mellitus Sliding scale insulin.  Critically ill  All the records are reviewed and case discussed with Care Management/Social Workerr. Management plans discussed with the patient, family and they are in agreement.  CODE STATUS: FULL CODE  DVT Prophylaxis: SCDs  TOTAL CC TIME TAKING CARE OF THIS PATIENT: 35 minutes.   POSSIBLE D/C IN 3-4 DAYS, DEPENDING ON CLINICAL CONDITION.  Hillary Bow R M.D on 12/03/2015 at 2:12 PM  Between 7am to 6pm - Pager - [phone]  After 6pm go to [email or web address] - password [email or web address] [facility]  Office  [phone]  CC: Primary care physician; Cletis Athens, MD  Note: This dictation was prepared with Dragon dictation along with smaller phrase technology. Any transcriptional errors that result from this process are unintentional.

## 2015-12-03 NOTE — Progress Notes (Signed)
Pharmacy Antibiotic Note  Marie [name] is a 60 y.o. female admitted on 12/02/2015 with sepsis.  Pharmacy has been consulted for vancomycin, aztreonam, and Levaquin dosing.  Plan: After discussion with Dr. Ashby Dawes, will d/c vancomycin with UTI as likely source and possible PNA with MRSA PCR negative.   Aztreonam 2 grams q 8 hours ordered.  Levaquin 750 mg q 24 hours ordered.  Height: '5\' 5"'$  (165.1 cm) Weight: 198 lb (89.8 kg) IBW/kg (Calculated) : 57  Temp (24hrs), Avg:98.2 F (36.8 C), Min:97.3 F (36.3 C), Max:98.6 F (37 C)   Recent Labs Lab 12/02/15 2126 12/02/15 2203 12/03/15 0207 12/03/15 0842  WBC 10.4  --   --  11.7*  CREATININE 1.22*  --   --  0.81  LATICACIDVEN  --  1.6 1.1  --     Estimated Creatinine Clearance: 83.8 mL/min (by C-G formula based on SCr of 0.81 mg/dL).    Allergies  Allergen Reactions  . Ampicillin Anaphylaxis  . Penicillins Anaphylaxis and Shortness Of Breath    Has patient had a PCN reaction causing immediate rash, facial/tongue/throat swelling, SOB or lightheadedness with hypotension: yes Has patient had a PCN reaction causing severe rash involving mucus membranes or skin necrosis: no Has patient had a PCN reaction that required hospitalization no Has patient had a PCN reaction occurring within the last 10 years: no If all of the above answers are "NO", then may proceed with Cephalosporin use.   . Avelox [Moxifloxacin Hcl In Nacl] Other (See Comments)    Reaction: MUSCLE CRAMPS  . Etodolac Rash  . Neurontin [Gabapentin] Anxiety    Antimicrobials this admission: Vancomycin 8/23  >> 8/24  aztreonam 8/23 >>  Levaquin 8/23 >>  Dose adjustments this admission:   Microbiology results: 8/24 MRSA PCR: negative 8/23 BCx: NGTD x 2 8/23 UCx: pending    8/23 UA: LE(+) NO2(+) WBC TNTC 8/23 CXR: "no convincing pneumonia"  Thank you for allowing pharmacy to be a part of this patient's care.  Ulice Dash D 12/03/2015 3:30 PM

## 2015-12-03 NOTE — ED Notes (Addendum)
MD paged due to new pulmonary edema on xray and CT. Pt denies any SOB or CP.

## 2015-12-03 NOTE — Consult Note (Signed)
[facility] Consultation     ASSESSMENT/PLAN   60 year old female lung cancer, with known metastases to brain, now with new metastases to liver and lung. Presenting with UTI and sepsis with possible pneumonia.  PULMONARY A:Primary right lung cancer, status post chemotherapy, now with metastatic disease to brain, liver, right. -Right middle lobe pneumonia. -COPD/emphysema. P:   -Will treat pneumonia with aztreonam/Levaquin. -Discussed with patient in regards new findings of metastatic disease to liver and recurrence in the lung.  CARDIOVASCULAR A: Septic shock with hypotension. -Appears improved. Lactic acid is negative P:  Continue IV fluids, wean down levo fed as tolerated.  RENAL/urinary A:  UTI with sepsis. P:   -Antibiotics as above.  GASTROINTESTINAL A: Abdominal pain and right upper quadrant, likely due to new liver metastases. Liver function testing appears normal. P:   GI prophylaxis, advance diet.  HEMATOLOGIC A:  Initial stage IB lung cancer, now with metastatic disease.  INFECTIOUS A:  Urinary tract infection with sepsis, pneumonia P:    Micro/culture results: U/a positive; urine culture pending.  BCx2 8/23; negative thus far.  UC 8/23; pending.  Sputum-- MRSA PCR negative.   Antibiotics:  vancomycin  >>  8/24 aztreonam  >>  Levaquin >>  ENDOCRINE A: Mildly elevated blood glucose. P:   Start sliding scale insulin.  NEUROLOGIC A:  Acute metabolic cephalopathy, likely secondary to sepsis. -Brain metastases. P:   -Continue treatment of sepsis, status post   MAJOR EVENTS/TEST RESULTS:   Best Practices  DVT Prophylaxis: Enoxaparin GI Prophylaxis: Famotidine   --------------------------------------- Advanced care planning:  Discussed the patient's new CT findings which include new metastases to liver and recurrence in the right lung. Appears that her cancer appears to be progressing she is scheduled to undergo  chemotherapy starting later this month. She is anxious to get is underway. She understands her condition and prognosis are poor, plus to continue to try with possible. Therefore she maintains her CODE STATUS remains full code. Terms of her medical decision-making, she would like to defer any medical decisions she is unable to make them to her boyfriend, she felt that her 2 children understand this and are in agreement with this. 30 minutes spent advanced care planning discussions.  ---------------------------------------   Name: Marie [name] MRN: [id number] DOB: 1955/07/12    ADMISSION DATE:  12/02/2015 CONSULTATION DATE: 12/03/15   REFERRING MD :  Dr. Jannifer Franklin  CHIEF COMPLAINT:  Hypotension/sepsis.    HISTORY OF PRESENT ILLNESS:   The patient is a 60 yo female with a history of Lung cancer diagnosed 08/2014, pT2a cN0 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 status post wedge resection.  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   She presented to the ER on 8/15 with spontaneous formation of deep ulcer on her left foot, treated and discharged, she presented again on 8/23 with confusion. She had been having abdominal pain and dysuria for the past few days and then her son noticed that she was confused and not making sense, he then call 911. She does not remember a lot of getting to the hospital, or being in the ER.   CT abd and CXR images reviewed; RML infiltrate and mult RLL nodules suspicious for metastatic disease not seen on previous Ct on 06/05/15. There are also new multiple liver nodules.   In the Er she was started  on pressors due to hypotension, and transferred to the ICU. Initial lactate 1.6; AG=10.  AP mildly elevated.    PAST MEDICAL HISTORY :  Past Medical History:  Diagnosis Date  . Arthritis   . Cancer ([place])      Lung CA right wedge removed  . CHF (congestive heart failure) ([place])   . COPD (chronic obstructive pulmonary disease) ([facility])    has oxygen but doesn't use  . Diabetes mellitus type 2 with neurological manifestations ([place])    Peripheral neuropathy and foot ulcers  . Hypertension   . PAD (peripheral artery disease) ([place])   . Peripheral neuropathy ([place])   . Shortness of breath dyspnea    Past Surgical History:  Procedure Laterality Date  . ABDOMINAL HYSTERECTOMY    . BACK SURGERY  x 7  . CARPAL TUNNEL RELEASE Bilateral   . CESAREAN SECTION     x3  . CORONARY ANGIOPLASTY     no stents  . FOOT SURGERY Left    diabetic ulcer  . KNEE ARTHROSCOPY Bilateral   . TONSILLECTOMY    . VIDEO ASSISTED THORACOSCOPY (VATS)/THOROCOTOMY Right 08/26/2014   Procedure: VIDEO ASSISTED THORACOSCOPY (VATS)/THOROCOTOMY;  Surgeon: Nestor Lewandowsky, MD;  Location: [facility] ORS;  Service: General;  Laterality: Right;   Prior to Admission medications   Medication Sig Start Date End Date Taking? Authorizing Provider  ALPRAZolam (XANAX) 0.25 MG tablet Take 1 tablet (0.25 mg total) by mouth 2 (two) times daily. 11/18/15  Yes Cammie Sickle, MD  budesonide-formoterol (SYMBICORT) 160-4.5 MCG/ACT inhaler Inhale 2 puffs into the lungs 2 (two) times daily. Reported on 05/20/2015   Yes Historical Provider, MD  clindamycin (CLEOCIN) 300 MG capsule Take 1 capsule (300 mg total) by mouth 3 (three) times daily. 11/24/15 12/04/15 Yes Rudene Re, MD  cyclobenzaprine (FLEXERIL) 10 MG tablet Take 10 mg by mouth 3 (three) times daily.    Yes Historical Provider, MD  dexamethasone (DECADRON) 4 MG tablet Take 1 tablet (4 mg total) by mouth daily. 11/12/15  Yes Noreene Filbert, MD  diazepam (VALIUM) 5 MG tablet Take 2 mg by mouth daily.  08/15/14  Yes Historical Provider, MD  diphenhydrAMINE (BENADRYL) 25 MG tablet Take 25 mg by mouth at bedtime.   Yes Historical Provider, MD  DULoxetine (CYMBALTA) 60 MG capsule Take 60 mg by mouth  daily.   Yes Historical Provider, MD  furosemide (LASIX) 40 MG tablet Take 40 mg by mouth.   Yes Historical Provider, MD  gemfibrozil (LOPID) 600 MG tablet Take 600 mg by mouth 2 (two) times daily before a meal.   Yes Historical Provider, MD  glipiZIDE (GLUCOTROL) 5 MG tablet Take 5 mg by mouth 2 (two) times daily before a meal.  06/26/14  Yes Historical Provider, MD  ibuprofen (ADVIL,MOTRIN) 100 MG tablet Take 100 mg by mouth every 6 (six) hours as needed for fever.   Yes Historical Provider, MD  Insulin Glargine (TOUJEO SOLOSTAR) 300 UNIT/ML SOPN Inject 90 Units into the skin daily.    Yes Historical Provider, MD  levETIRAcetam (KEPPRA) 500 MG tablet Take 1 tablet by mouth 2 (two) times daily. 12/01/15  Yes Historical Provider, MD  LYRICA 150 MG capsule Take 150 mg by mouth 2 (two) times daily.  10/29/15  Yes Historical Provider, MD  metFORMIN (GLUCOPHAGE) 1000 MG tablet Take 500 mg by mouth 2 (two) times daily with a meal.  05/19/15  Yes Historical Provider, MD  oxyCODONE (OXY IR/ROXICODONE) 5 MG immediate release tablet 1-2 tabs every 4  hours as needed for severe pain 11/25/15  Yes Cammie Sickle, MD  pantoprazole (PROTONIX) 40 MG tablet Take 40 mg by mouth daily.  10/28/15 10/27/16 Yes Historical Provider, MD  promethazine (PHENERGAN) 12.5 MG tablet Take 1 tablet (12.5 mg total) by mouth every 6 (six) hours as needed for nausea or vomiting. 11/19/15  Yes Cammie Sickle, MD  propranolol (INDERAL) 40 MG tablet Take 40 mg by mouth 2 (two) times daily.   Yes Historical Provider, MD  rosuvastatin (CRESTOR) 10 MG tablet Take 10 mg by mouth daily.   Yes Historical Provider, MD  senna-docusate (SENOKOT-S) 8.6-50 MG tablet Take 1 tablet by mouth daily as needed.  10/28/15 10/27/16 Yes Historical Provider, MD   Allergies  Allergen Reactions  . Ampicillin Anaphylaxis  . Penicillins Anaphylaxis and Shortness Of Breath    Has patient had a PCN reaction causing immediate rash, facial/tongue/throat  swelling, SOB or lightheadedness with hypotension: yes Has patient had a PCN reaction causing severe rash involving mucus membranes or skin necrosis: no Has patient had a PCN reaction that required hospitalization no Has patient had a PCN reaction occurring within the last 10 years: no If all of the above answers are "NO", then may proceed with Cephalosporin use.   . Avelox [Moxifloxacin Hcl In Nacl] Other (See Comments)    Reaction: MUSCLE CRAMPS  . Etodolac Rash  . Neurontin [Gabapentin] Anxiety    FAMILY HISTORY:  Family History  Problem Relation Age of Onset  . Hypertension Brother   . Coronary artery disease Mother   . Diabetes Mellitus II Mother   . Coronary artery disease Father   . Diabetes Mellitus II Father   . Diabetes Mellitus II Brother    SOCIAL HISTORY:  reports that she has been smoking Cigarettes.  She has a 23.00 pack-year smoking history. She has never used smokeless tobacco. She reports that she does not drink alcohol or use drugs.  REVIEW OF SYSTEMS:   Constitutional: Feels well. Cardiovascular: No chest pain.  Pulmonary: Denies dyspnea.   The remainder of systems were reviewed and were found to be negative other than what is documented in the HPI.    VITAL SIGNS: Temp:  [97.3 F (36.3 C)-98.6 F (37 C)] 97.7 F (36.5 C) (08/24 0920) Pulse Rate:  [71-92] 87 (08/24 1000) Resp:  [13-27] 15 (08/24 1000) BP: (69-109)/(44-73) 101/57 (08/24 1000) SpO2:  [85 %-100 %] 99 % (08/24 1000) Weight:  [190 lb (86.2 kg)-198 lb (89.8 kg)] 198 lb (89.8 kg) (08/24 0920) HEMODYNAMICS:   VENTILATOR SETTINGS:   INTAKE / OUTPUT:  Intake/Output Summary (Last 24 hours) at 12/03/15 1055 Last data filed at 12/03/15 1042  Gross per 24 hour  Intake                0 ml  Output             2015 ml  Net            -2015 ml    Physical Examination:   VS: BP (!) 101/57   Pulse 87   Temp 97.7 F (36.5 C) (Oral)   Resp 15   Ht '5\' 5"'$  (1.651 m)   Wt 198 lb (89.8 kg)    SpO2 99%   BMI 32.95 kg/m   General Appearance: No distress  Neuro:without focal findings, mental status, speech normal,. HEENT: PERRLA, EOM intact, no ptosis, no other lesions noticed;  Pulmonary: normal breath sounds., diaphragmatic excursion normal. CardiovascularNormal S1,S2.  No  m/r/g.    Abdomen: Benign, Soft, non-tender, No masses, hepatosplenomegaly, No lymphadenopathy Renal:  No costovertebral tenderness  GU:  Not performed at this time. Endoc: No evident thyromegaly, no signs of acromegaly. Skin:   warm, no rashes, no ecchymosis  Extremities: normal, no cyanosis, clubbing, no edema, warm with normal capillary refill.    LABS: Reviewed   LABORATORY PANEL:   CBC  Recent Labs Lab 12/03/15 0842  WBC 11.7*  HGB 10.4*  HCT 30.7*  PLT 260    Chemistries   Recent Labs Lab 12/02/15 2126 12/03/15 0842  NA 135 137  K 3.8 3.9  CL 102 104  CO2 23 22  GLUCOSE 180* 149*  BUN 23* 19  CREATININE 1.22* 0.81  CALCIUM 8.4* 8.1*  AST 22  --   ALT 19  --   ALKPHOS 155*  --   BILITOT 0.5  --      Recent Labs Lab 12/03/15 0913  GLUCAP 163*   No results for input(s): PHART, PCO2ART, PO2ART in the last 168 hours.  Recent Labs Lab 12/02/15 2126  AST 22  ALT 19  ALKPHOS 155*  BILITOT 0.5  ALBUMIN 2.5*    Cardiac Enzymes  Recent Labs Lab 12/02/15 2126  TROPONINI <0.03    RADIOLOGY:  Dg Chest 1 View  Result Date: 12/02/2015 CLINICAL DATA:  Code sepsis.  Lung cancer and altered mental status EXAM: CHEST 1 VIEW COMPARISON:  06/05/2015 FINDINGS: Chronic hazy appearance at the right base where there are lung sutures. Scar in this region on 2016 chest CT. Mild increase in left basilar markings which was also seen previously. No cardiomegaly for technique. Negative aortic and hilar contours. IMPRESSION: Stable compared to prior, including postoperative changes in the right lower lobe. No convincing pneumonia. Electronically Signed   By: Monte Fantasia M.D.    On: 12/02/2015 21:49   Ct Abdomen Pelvis W Contrast  Result Date: 12/03/2015 CLINICAL DATA:  Severe sepsis. Right lower quadrant abdominal pain. Lung cancer. EXAM: CT ABDOMEN AND PELVIS WITH CONTRAST TECHNIQUE: Multidetector CT imaging of the abdomen and pelvis was performed using the standard protocol following bolus administration of intravenous contrast. CONTRAST:  132m ISOVUE-300 IOPAMIDOL (ISOVUE-300) INJECTION 61% COMPARISON:  Head CT 07/09/2014.  Chest CT 09/25/2014 FINDINGS: Lower chest and abdominal wall: Partly seen ill-defined airspace opacity in the right middle lobe. There are new pulmonary nodules in the bibasilar lungs, largest in the right posterior costophrenic sulcus measuring 12 mm. Per report in care everywhere 10/23/2015 these are new/progressed. Septal thickening consistent with edema. Hepatobiliary: Numerous low-density liver masses that are new compared to chest CT comparison. These measure up to 3 cm in size in multiple segments. Although low-density they are likely solid. Multi focal abscess considered unlikely.Cholecystectomy. Normal common bile duct diameter. Pancreas: Unremarkable. Spleen: Unremarkable. Adrenals/Urinary Tract: Negative adrenals. Patchy heterogeneous hypo enhancement in the kidneys having ill-defined masslike appearance. Pyelonephritis is considered given the clinical circumstances, but metastases are favored. Unremarkable bladder. Stomach/Bowel:  No obstruction. No appendicitis. Reproductive:Hysterectomy. Possible right oophorectomy. Negative left ovary. Vascular/Lymphatic: No acute vascular abnormality. Mild enlargement of lymph nodes in the deep liver drainage. Other: No ascites or pneumoperitoneum. Musculoskeletal: No acute or aggressive finding. L3-4 and L4-5 discectomy with solid bony fusion. IMPRESSION: 1. Partly seen right middle lobe pneumonia. Nodules in the bilateral lungs are primarily concerning for metastatic foci, but hematogenous infection is not  excluded as these have rapidly developed compared to 10/23/2015 chest CT report. 2. Pulmonary edema. 3. Abnormal bilateral renal perfusion  which could be pyelonephritis or metastases. Correlate with urinalysis and follow-up. 4. Numerous hepatic metastases. Electronically Signed   By: Monte Fantasia M.D.   On: 12/03/2015 00:23   Dg Chest Portable 1 View  Result Date: 12/02/2015 CLINICAL DATA:  Central line placement EXAM: PORTABLE CHEST 1 VIEW COMPARISON:  Earlier today FINDINGS: New right IJ central line with tip at the SVC level. No pneumothorax or new mediastinal widening. New bilateral interstitial prominence suggesting edema. Normal heart size and stable mediastinal contours. Postoperative changes in the right lower lobe are becoming obscured. IMPRESSION: 1. New central line without adverse finding. 2. New pulmonary edema. Electronically Signed   By: Monte Fantasia M.D.   On: 12/02/2015 23:37       --Marda Stalker, MD.  Board Certified in Internal Medicine, Pulmonary Medicine, [place], and Sleep Medicine.  ICU Pager [phone] [facility] Office Number: [phone]  Patricia Pesa, M.D.  Vilinda Boehringer, M.D.  Merton Border, M.D   12/03/2015, 10:55 AM

## 2015-12-03 NOTE — ED Notes (Signed)
Dr. Marcille Blanco at the bedside now.

## 2015-12-03 NOTE — Progress Notes (Signed)
Pharmacy Antibiotic Note  Marie [name] is a 60 y.o. female admitted on 12/02/2015 with sepsis.  Pharmacy has been consulted for vancomycin, aztreonam, and Levaquin dosing.  Plan: DW 69kg  Vd 48L kei 0.05 hr-1  T1/2 14 hours Vancomycin 1 gram q 18 hours ordered with stacked dosing. Level before 5th dose. Goal trough 15-20.  Aztreonam 2 grams q 8 hours ordered.  Levaquin 750 mg q 24 hours ordered.  Height: '5\' 5"'$  (165.1 cm) Weight: 190 lb (86.2 kg) IBW/kg (Calculated) : 57  Temp (24hrs), Avg:97.7 F (36.5 C), Min:97.3 F (36.3 C), Max:98.1 F (36.7 C)   Recent Labs Lab 12/02/15 2126 12/02/15 2203 12/03/15 0207  WBC 10.4  --   --   CREATININE 1.22*  --   --   LATICACIDVEN  --  1.6 1.1    Estimated Creatinine Clearance: 54.5 mL/min (by C-G formula based on SCr of 1.22 mg/dL).    Allergies  Allergen Reactions  . Ampicillin Anaphylaxis  . Penicillins Anaphylaxis and Shortness Of Breath    Has patient had a PCN reaction causing immediate rash, facial/tongue/throat swelling, SOB or lightheadedness with hypotension: yes Has patient had a PCN reaction causing severe rash involving mucus membranes or skin necrosis: no Has patient had a PCN reaction that required hospitalization no Has patient had a PCN reaction occurring within the last 10 years: no If all of the above answers are "NO", then may proceed with Cephalosporin use.   . Avelox [Moxifloxacin Hcl In Nacl] Other (See Comments)    Reaction: MUSCLE CRAMPS  . Metformin     Other reaction(s): Other (See Comments) NUMB MOUTH AND FACE  . Etodolac Rash  . Neurontin [Gabapentin] Anxiety    Antimicrobials this admission: vancomycin  >>  aztreonam  >>  Levaquin >>  Dose adjustments this admission:   Microbiology results: 8/23 BCx: pending 8/23 UCx: pending    8/23 UA: LE(+) NO2(+) WBC TNTC 8/23 CXR: "no convincing pneumonia"  Thank you for allowing pharmacy to be a part of this patient's  care.  Marco Adelson S 12/03/2015 4:20 AM

## 2015-12-03 NOTE — ED Notes (Signed)
Report called to CCU.

## 2015-12-03 NOTE — ED Notes (Signed)
Paged Dr. Marcille Blanco in regards to low BP, sweating, dizziness ,color change.

## 2015-12-03 NOTE — Progress Notes (Signed)
*  PRELIMINARY RESULTS* Echocardiogram 2D Echocardiogram has been performed.  Marie [name] 12/03/2015, 2:28 PM

## 2015-12-03 NOTE — ED Notes (Signed)
Paging MD in regards BP.

## 2015-12-03 NOTE — ED Notes (Signed)
Pt states feels alittle better. Pt skin dry no sweating noted. Pt color improving. Pt states still dizzy and abd pain. Dr .Marcille Blanco aware.

## 2015-12-03 NOTE — ED Notes (Signed)
Pt presents via ems from home  Daughter called EMS thought she was having a stroke. Fire Dept got pt's BP 90/60. EMS got 80/60 and 70/52. Pale Hypoxia EMS applied 4L [place]. IV fluids NS 1200ML. Pt has brain cancer radiation today no chemo yet. Pt states was on abt for her pressure ulcer stop talking them thought they made them sick. Pt has hx of CHF. Pt denies SOB, CP. Pt currently having hypoxia, hypotension, fever last night, frequent urination and burning, diffused abd pain worse to lower abd. Pt has nausea denies vomiting and diarrhea. MD at the bedside on arrival to ER. Pt has epiodes of slurred speech and lethgaric. Pt is Alert and oriented x4. Hx of falls. Code Sepsis intiated after arrival to ER.

## 2015-12-03 NOTE — ED Notes (Signed)
Called RN informed bed ready [phone]

## 2015-12-03 NOTE — Progress Notes (Addendum)
Patient arrived to ICU from ED without current order for levophed and levophed was running at 59mg/hr when patient arrived to ICU. During report from ED I informed ED RN that patient needed a continuous order for Levophed instead of the [facility] Will continue to monitor patient.

## 2015-12-03 NOTE — ED Notes (Signed)
Spoke to Dr. Marcille Blanco in regards to urine retention doing bladder scan. Pt was sleeping. Once she woke up in pain 9/10 to abd and unable to urinate.

## 2015-12-04 ENCOUNTER — Ambulatory Visit: Payer: Medicare Other

## 2015-12-04 DIAGNOSIS — E1140 Type 2 diabetes mellitus with diabetic neuropathy, unspecified: Secondary | ICD-10-CM

## 2015-12-04 DIAGNOSIS — R51 Headache: Secondary | ICD-10-CM

## 2015-12-04 DIAGNOSIS — R4182 Altered mental status, unspecified: Secondary | ICD-10-CM

## 2015-12-04 DIAGNOSIS — F17210 Nicotine dependence, cigarettes, uncomplicated: Secondary | ICD-10-CM

## 2015-12-04 DIAGNOSIS — Z923 Personal history of irradiation: Secondary | ICD-10-CM

## 2015-12-04 DIAGNOSIS — Z79899 Other long term (current) drug therapy: Secondary | ICD-10-CM

## 2015-12-04 DIAGNOSIS — M79621 Pain in right upper arm: Secondary | ICD-10-CM

## 2015-12-04 DIAGNOSIS — J189 Pneumonia, unspecified organism: Secondary | ICD-10-CM

## 2015-12-04 DIAGNOSIS — I10 Essential (primary) hypertension: Secondary | ICD-10-CM

## 2015-12-04 DIAGNOSIS — C7931 Secondary malignant neoplasm of brain: Secondary | ICD-10-CM

## 2015-12-04 DIAGNOSIS — M1990 Unspecified osteoarthritis, unspecified site: Secondary | ICD-10-CM

## 2015-12-04 DIAGNOSIS — Z794 Long term (current) use of insulin: Secondary | ICD-10-CM

## 2015-12-04 DIAGNOSIS — E1165 Type 2 diabetes mellitus with hyperglycemia: Secondary | ICD-10-CM

## 2015-12-04 DIAGNOSIS — N390 Urinary tract infection, site not specified: Secondary | ICD-10-CM

## 2015-12-04 DIAGNOSIS — C3431 Malignant neoplasm of lower lobe, right bronchus or lung: Secondary | ICD-10-CM

## 2015-12-04 DIAGNOSIS — K769 Liver disease, unspecified: Secondary | ICD-10-CM

## 2015-12-04 DIAGNOSIS — I739 Peripheral vascular disease, unspecified: Secondary | ICD-10-CM

## 2015-12-04 LAB — COMPREHENSIVE METABOLIC PANEL
ALBUMIN: 2.1 g/dL — AB (ref 3.5–5.0)
ALT: 19 U/L (ref 14–54)
ANION GAP: 8 (ref 5–15)
AST: 20 U/L (ref 15–41)
Alkaline Phosphatase: 160 U/L — ABNORMAL HIGH (ref 38–126)
BILIRUBIN TOTAL: 0.2 mg/dL — AB (ref 0.3–1.2)
BUN: 10 mg/dL (ref 6–20)
CHLORIDE: 107 mmol/L (ref 101–111)
CO2: 24 mmol/L (ref 22–32)
Calcium: 8.2 mg/dL — ABNORMAL LOW (ref 8.9–10.3)
Creatinine, Ser: 0.56 mg/dL (ref 0.44–1.00)
GFR calc Af Amer: >60 mL/min (ref >60)
GFR calc non Af Amer: >60 mL/min (ref >60)
GLUCOSE: 150 mg/dL — AB (ref 65–99)
POTASSIUM: 3.5 mmol/L (ref 3.5–5.1)
Sodium: 139 mmol/L (ref 135–145)
TOTAL PROTEIN: 6.5 g/dL (ref 6.5–8.1)

## 2015-12-04 LAB — CBC WITH DIFFERENTIAL/PLATELET
BASOS ABS: 0 10*3/uL (ref 0–0.1)
BASOS PCT: 1 %
Eosinophils Absolute: 0.4 10*3/uL (ref 0–0.7)
Eosinophils Relative: 5 %
HEMATOCRIT: 28.3 % — AB (ref 35.0–47.0)
Hemoglobin: 9.6 g/dL — ABNORMAL LOW (ref 12.0–16.0)
Lymphocytes Relative: 13 %
Lymphs Abs: 1.1 10*3/uL (ref 1.0–3.6)
MCH: 30.3 pg (ref 26.0–34.0)
MCHC: 33.9 g/dL (ref 32.0–36.0)
MCV: 89.4 fL (ref 80.0–100.0)
MONO ABS: 0.6 10*3/uL (ref 0.2–0.9)
Monocytes Relative: 7 %
NEUTROS ABS: 6 10*3/uL (ref 1.4–6.5)
Neutrophils Relative %: 74 %
PLATELETS: 221 10*3/uL (ref 150–440)
RBC: 3.16 MIL/uL — ABNORMAL LOW (ref 3.80–5.20)
RDW: 14.5 % (ref 11.5–14.5)
WBC: 8 10*3/uL (ref 3.6–11.0)

## 2015-12-04 LAB — GLUCOSE, CAPILLARY
GLUCOSE-CAPILLARY: 154 mg/dL — AB (ref 65–99)
GLUCOSE-CAPILLARY: 271 mg/dL — AB (ref 65–99)
GLUCOSE-CAPILLARY: 194 mg/dL — AB (ref 65–99)
Glucose-Capillary: 226 mg/dL — ABNORMAL HIGH (ref 65–99)

## 2015-12-04 MED ORDER — KETOROLAC TROMETHAMINE 15 MG/ML IJ SOLN
30.0000 mg | Freq: Four times a day (QID) | INTRAMUSCULAR | Status: DC | PRN
  Administered 2015-12-06: 30 mg via INTRAVENOUS
  Filled 2015-12-04: qty 2

## 2015-12-04 MED ORDER — SENNOSIDES-DOCUSATE SODIUM 8.6-50 MG PO TABS
1.0000 | ORAL_TABLET | Freq: Two times a day (BID) | ORAL | Status: DC
  Administered 2015-12-04 – 2015-12-07 (×7): 1 via ORAL
  Filled 2015-12-04 (×7): qty 1

## 2015-12-04 MED ORDER — LEVOFLOXACIN 750 MG PO TABS
750.0000 mg | ORAL_TABLET | Freq: Every day | ORAL | Status: DC
  Administered 2015-12-04 – 2015-12-07 (×4): 750 mg via ORAL
  Filled 2015-12-04 (×2): qty 1
  Filled 2015-12-04: qty 2
  Filled 2015-12-04: qty 1

## 2015-12-04 MED ORDER — FENTANYL 12 MCG/HR TD PT72
12.5000 ug | MEDICATED_PATCH | TRANSDERMAL | Status: DC
  Administered 2015-12-04: 12.5 ug via TRANSDERMAL
  Filled 2015-12-04: qty 1

## 2015-12-04 MED ORDER — KETOROLAC TROMETHAMINE 15 MG/ML IJ SOLN
30.0000 mg | Freq: Once | INTRAMUSCULAR | Status: AC
Start: 2015-12-04 — End: 2015-12-04
  Administered 2015-12-04: 30 mg via INTRAVENOUS
  Filled 2015-12-04: qty 2

## 2015-12-04 NOTE — Consult Note (Signed)
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  Abrasion to right medial foot near great toe.   Wound type:Neuropathic Pressure Ulcer POA: Yes Measurement:Left plantar foot near great toe  2.5 cm x 2 cm x 0.1 cm  Left plantar midfoot 1.5 cm x 2 cm x 0.2 cm  Right lateral foot 0.5 cm diameter Wound CCE:[id number], devitalized tissue. Drainage (amount, consistency, odor) None noted.  Periwound:Calloused   Dressing procedure/placement/frequency:Cleanse wounds to left foot with NS and pat gently dry.  Paint with betadine swab daily.   Silicone border foam dressing to abrasion right foot.   Will not follow at this time.  Please re-consult if needed.  Domenic Moras RN BSN [place] Pager [phone]

## 2015-12-04 NOTE — Progress Notes (Signed)
Pt received to room 124 from ICU. Oriented to the room .

## 2015-12-04 NOTE — Progress Notes (Signed)
[place] CONSULT NOTE  Patient Care Team: Cletis Athens, MD as PCP - General (Unknown Physician Specialty)  CHIEF COMPLAINTS/PURPOSE OF CONSULTATION:  Metastatic lung cancer  HISTORY OF PRESENTING ILLNESS:  Marie [name] 60 y.o.  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 Patient had mental status changes/confusion; however feeling poorly- when the EMS was called.  Patient was found to be hypotensive; needed pressors/IV fluids- currently being treated for sepsis from UTI/pneumonia. CT of the abdomen and pelvis showed-multiple liver lesions-infectious versus malignancy. Also lung imaging- multiple small nodules.  Patient currently- feels better off pressors. Complains of pain in her right shoulder. Mild chronic headache. Not any worse. No nausea no vomiting.   ROS: A complete 10 point review of system is done which is negative except mentioned above in history of present illness  MEDICAL HISTORY:  Past Medical History:  Diagnosis Date  . Arthritis   . Cancer ([place])    Lung CA right wedge removed  . CHF (congestive heart failure) ([place])   . COPD (chronic obstructive pulmonary disease) (H[facility])    has oxygen but doesn't use  . Diabetes mellitus type 2 with neurological manifestations ([place])    Peripheral neuropathy and foot ulcers  . Hypertension   . PAD (peripheral artery disease) ([place])   . Peripheral neuropathy ([place])   . Shortness of breath dyspnea     SURGICAL HISTORY: Past Surgical History:  Procedure Laterality Date  . ABDOMINAL HYSTERECTOMY    . BACK SURGERY  x 7  . CARPAL TUNNEL RELEASE Bilateral   . CESAREAN SECTION     x3  . CORONARY ANGIOPLASTY     no stents  . FOOT SURGERY Left    diabetic ulcer  . KNEE ARTHROSCOPY Bilateral   . TONSILLECTOMY    . VIDEO ASSISTED THORACOSCOPY (VATS)/THOROCOTOMY Right 08/26/2014   Procedure: VIDEO ASSISTED THORACOSCOPY  (VATS)/THOROCOTOMY;  Surgeon: Nestor Lewandowsky, MD;  Location: [facility] ORS;  Service: General;  Laterality: Right;    SOCIAL HISTORY: Social History   Social History  . Marital status: Widowed    Spouse name: N/A  . Number of children: N/A  . Years of education: N/A   Occupational History  . Not on file.   Social History Main Topics  . Smoking status: Current Every Day Smoker    Packs/day: 0.50    Years: 46.00    Types: Cigarettes  . Smokeless tobacco: Never Used  . Alcohol use No  . Drug use: No  . Sexual activity: Not on file   Other Topics Concern  . Not on file   Social History Narrative  . No narrative on file    FAMILY HISTORY: Family History  Problem Relation Age of Onset  . Hypertension Brother   . Coronary artery disease Mother   . Diabetes Mellitus II Mother   . Coronary artery disease Father   . Diabetes Mellitus II Father   . Diabetes Mellitus II Brother     ALLERGIES:  is allergic to ampicillin; penicillins; avelox [moxifloxacin hcl in nacl]; etodolac; and neurontin [gabapentin].  MEDICATIONS:  Current Facility-Administered Medications  Medication Dose Route Frequency Provider Last Rate Last Dose  . 0.9 %  sodium chloride infusion   Intravenous Continuous Laverle Hobby, MD 125 mL/hr at 12/04/15 0751    . acetaminophen (TYLENOL) tablet 650 mg  650 mg Oral Q6H PRN Lance Coon, MD   650 mg at 12/04/15 0556   Or  .  acetaminophen (TYLENOL) suppository 650 mg  650 mg Rectal Q6H PRN Lance Coon, MD      . ALPRAZolam Duanne Moron) tablet 0.25 mg  0.25 mg Oral BID Lance Coon, MD   0.25 mg at 12/03/15 2257  . antiseptic oral rinse (CPC / CETYLPYRIDINIUM CHLORIDE 0.05%) solution 7 mL  7 mL Mouth Rinse BID Laverle Hobby, MD      . aztreonam (AZACTAM) 2 g in dextrose 5 % 50 mL IVPB  2 g Intravenous Q8H Laverle Hobby, MD   2 g at 12/04/15 0556  . DULoxetine (CYMBALTA) DR capsule 60 mg  60 mg Oral Daily Lance Coon, MD   60 mg at 12/03/15 1045  .  enoxaparin (LOVENOX) injection 40 mg  40 mg Subcutaneous Q24H Lance Coon, MD   40 mg at 12/03/15 1927  . famotidine (PEPCID) tablet 20 mg  20 mg Oral Daily Laverle Hobby, MD   20 mg at 12/03/15 1408  . insulin aspart (novoLOG) injection 0-5 Units  0-5 Units Subcutaneous QHS Lance Coon, MD      . insulin aspart (novoLOG) injection 0-9 Units  0-9 Units Subcutaneous TID WC Lance Coon, MD   2 Units at 12/04/15 [phone]  . levETIRAcetam (KEPPRA) tablet 500 mg  500 mg Oral BID Lance Coon, MD   500 mg at 12/03/15 2257  . levofloxacin (LEVAQUIN) IVPB 750 mg  750 mg Intravenous Q24H Merlyn Lot, MD   750 mg at 12/03/15 1719  . mometasone-formoterol (DULERA) 200-5 MCG/ACT inhaler 2 puff  2 puff Inhalation BID Lance Coon, MD   2 puff at 12/03/15 1927  . norepinephrine (LEVOPHED) '4mg'$  in D5W 232m premix infusion  0-40 mcg/min Intravenous Titrated PLaverle Hobby MD   Stopped at 12/03/15 1638  . ondansetron (ZOFRAN) tablet 4 mg  4 mg Oral Q6H PRN DLance Coon MD   4 mg at 12/03/15 0402   Or  . ondansetron ([facility] injection 4 mg  4 mg Intravenous Q6H PRN DLance Coon MD      . oxyCODONE (Oxy IR/ROXICODONE) immediate release tablet 10 mg  10 mg Oral Q4H PRN DLance Coon MD   10 mg at 12/04/15 0747  . rosuvastatin (CRESTOR) tablet 10 mg  10 mg Oral Daily DLance Coon MD   10 mg at 12/03/15 1045  . sodium chloride flush (NS) 0.9 % injection 3 mL  3 mL Intravenous Q12H DLance Coon MD   3 mL at 12/03/15 2200      .  PHYSICAL EXAMINATION:  Vitals:   12/04/15 0500 12/04/15 0600  BP: (!) 90/56 92/63  Pulse: 87 94  Resp: 18 20  Temp:     Filed Weights   12/02/15 2135 12/03/15 0920  Weight: 190 lb (86.2 kg) 198 lb (89.8 kg)    GENERAL: Well-nourished well-developed; Alert, no distress and comfortable.   Alone.  EYES: no pallor or icterus OROPHARYNX: no thrush or ulceration. No thrush.  NECK: supple, no masses felt LYMPH:  no palpable lymphadenopathy in the cervical,  axillary or inguinal regions LUNGS: decreased breath sounds to auscultation at bases and  No wheeze or crackles HEART/CVS: regular rate & rhythm and no murmurs; No lower extremity edema ABDOMEN: abdomen soft, non-tender and normal bowel sounds Musculoskeletal:no cyanosis of digits and no clubbing  PSYCH: alert & oriented x 3 with fluent speech NEURO: no focal motor/sensory deficits SKIN:  ulcers noted on the left foot.   LABORATORY DATA:  I have reviewed the data as listed Lab Results  Component Value Date  WBC 8.0 12/04/2015   HGB 9.6 (L) 12/04/2015   HCT 28.3 (L) 12/04/2015   MCV 89.4 12/04/2015   PLT 221 12/04/2015    Recent Labs  11/18/15 1417  12/02/15 2126 12/03/15 0842 12/04/15 0600  NA 127*  < > 135 137 139  K 4.3  < > 3.8 3.9 3.5  CL 93*  < > 102 104 107  CO2 21*  < > '23 22 24  '$ GLUCOSE 703*  < > 180* 149* 150*  BUN 26*  < > 23* 19 10  CREATININE 1.26*  < > 1.22* 0.81 0.56  CALCIUM 8.8*  < > 8.4* 8.1* 8.2*  GFRNONAA 46*  < > 48* >60 >60  GFRAA 53*  < > 55* >60 >60  PROT 7.6  --  7.0  --  6.5  ALBUMIN 3.3*  --  2.5*  --  2.1*  AST 20  --  22  --  20  ALT 22  --  19  --  19  ALKPHOS 167*  --  155*  --  160*  BILITOT 0.3  --  0.5  --  0.2*  < > = values in this interval not displayed.  RADIOGRAPHIC STUDIES: I have personally reviewed the radiological images as listed and agreed with the findings in the report. Dg Chest 1 View  Result Date: 12/02/2015 CLINICAL DATA:  Code sepsis.  Lung cancer and altered mental status EXAM: CHEST 1 VIEW COMPARISON:  06/05/2015 FINDINGS: Chronic hazy appearance at the right base where there are lung sutures. Scar in this region on 2016 chest CT. Mild increase in left basilar markings which was also seen previously. No cardiomegaly for technique. Negative aortic and hilar contours. IMPRESSION: Stable compared to prior, including postoperative changes in the right lower lobe. No convincing pneumonia. Electronically Signed   By:  Monte Fantasia M.D.   On: 12/02/2015 21:49   Ct Abdomen Pelvis W Contrast  Result Date: 12/03/2015 CLINICAL DATA:  Severe sepsis. Right lower quadrant abdominal pain. Lung cancer. EXAM: CT ABDOMEN AND PELVIS WITH CONTRAST TECHNIQUE: Multidetector CT imaging of the abdomen and pelvis was performed using the standard protocol following bolus administration of intravenous contrast. CONTRAST:  185m ISOVUE-300 IOPAMIDOL (ISOVUE-300) INJECTION 61% COMPARISON:  Head CT 07/09/2014.  Chest CT 09/25/2014 FINDINGS: Lower chest and abdominal wall: Partly seen ill-defined airspace opacity in the right middle lobe. There are new pulmonary nodules in the bibasilar lungs, largest in the right posterior costophrenic sulcus measuring 12 mm. Per report in care everywhere 10/23/2015 these are new/progressed. Septal thickening consistent with edema. Hepatobiliary: Numerous low-density liver masses that are new compared to chest CT comparison. These measure up to 3 cm in size in multiple segments. Although low-density they are likely solid. Multi focal abscess considered unlikely.Cholecystectomy. Normal common bile duct diameter. Pancreas: Unremarkable. Spleen: Unremarkable. Adrenals/Urinary Tract: Negative adrenals. Patchy heterogeneous hypo enhancement in the kidneys having ill-defined masslike appearance. Pyelonephritis is considered given the clinical circumstances, but metastases are favored. Unremarkable bladder. Stomach/Bowel:  No obstruction. No appendicitis. Reproductive:Hysterectomy. Possible right oophorectomy. Negative left ovary. Vascular/Lymphatic: No acute vascular abnormality. Mild enlargement of lymph nodes in the deep liver drainage. Other: No ascites or pneumoperitoneum. Musculoskeletal: No acute or aggressive finding. L3-4 and L4-5 discectomy with solid bony fusion. IMPRESSION: 1. Partly seen right middle lobe pneumonia. Nodules in the bilateral lungs are primarily concerning for metastatic foci, but  hematogenous infection is not excluded as these have rapidly developed compared to 10/23/2015 chest CT report. 2. Pulmonary  edema. 3. Abnormal bilateral renal perfusion which could be pyelonephritis or metastases. Correlate with urinalysis and follow-up. 4. Numerous hepatic metastases. Electronically Signed   By: Monte Fantasia M.D.   On: 12/03/2015 00:23   Dg Chest Portable 1 View  Result Date: 12/02/2015 CLINICAL DATA:  Central line placement EXAM: PORTABLE CHEST 1 VIEW COMPARISON:  Earlier today FINDINGS: New right IJ central line with tip at the SVC level. No pneumothorax or new mediastinal widening. New bilateral interstitial prominence suggesting edema. Normal heart size and stable mediastinal contours. Postoperative changes in the right lower lobe are becoming obscured. IMPRESSION: 1. New central line without adverse finding. 2. New pulmonary edema. Electronically Signed   By: Monte Fantasia M.D.   On: 12/02/2015 23:37   Dg Foot Complete Left  Result Date: 11/24/2015 CLINICAL DATA:  Open wound on left foot, initial encounter EXAM: LEFT FOOT - COMPLETE 3+ VIEW COMPARISON:  10/31/2014 FINDINGS: Changes in the left second toe which have progressed somewhat in the interval from the prior exam particularly in the proximal phalanx. Soft tissue wound is again noted. No frank bony destruction to suggest osteomyelitis is seen. Clinical correlation is recommended. IMPRESSION: Some mild progressive loss at the base of the second proximal phalanx. Associated soft tissue wound is noted although no definitive bony destruction is seen. Electronically Signed   By: Inez Catalina M.D.   On: 11/24/2015 16:20    ASSESSMENT & PLAN:   # 60 year old female patient diagnosed metastatic adenocarcinoma lung to the brain- currently admitted the hospital for mental status changes/sepsis   # Metastatic adenocarcinoma the lung- the brain. Reviewed the imaging results with the patient-liver showing multiple lesions  suspicious for progressive malignancy. Patient is PDL-1 positive [70%]- should expect good response from immunotherapy. Patient has not started therapy yet. Plan to start treatment after radiation is complete.   #  Currently on whole brain radiation [patient did finish radiation next week]. Currently on hold given the acute illness.  # Sepsis- UTI/pneumonia- on antibiotics improving clinically.  # Mental status changes/encephalopathy- secondary to sepsis improved.  # Poorly controlled diabetes elevated blood sugars- hold off steroids [because of metastasis to the brain]. Continue insulin as planned  # All questions were answered.   # Thank you Dr. Darvin Neighbours for allowing me to participate in the care of your pleasant patient. Please do not hesitate to contact me with questions or concerns in the interim.    Cammie Sickle, MD 12/04/2015 8:14 AM

## 2015-12-04 NOTE — Progress Notes (Signed)
Report called to Traci on 1c. Pt to move to room 124. Pt aware of transfer. Rns to call Pt boyfriend, Liliane Channel, at Pts request.

## 2015-12-04 NOTE — Progress Notes (Signed)
[facility] [place] NAME: Marie [name]    MR#:  [id number]  DATE OF BIRTH:  03-17-1956  SUBJECTIVE:   Off vasopressors today. Remains afebrile. She is complaining of some right shoulder pain and generalized aches.  REVIEW OF SYSTEMS:    Review of Systems  Constitutional: Negative for chills, fever and malaise/fatigue.  HENT: Negative for sore throat.   Eyes: Negative for blurred vision, double vision and pain.  Respiratory: Negative for cough, hemoptysis, shortness of breath and wheezing.   Cardiovascular: Negative for chest pain, palpitations, orthopnea and leg swelling.  Gastrointestinal: Negative for abdominal pain, constipation, diarrhea, heartburn, nausea and vomiting.  Genitourinary: Negative for dysuria and hematuria.  Musculoskeletal: Positive for joint pain (right shoulder). Negative for back pain.  Skin: Negative for rash.  Neurological: Positive for weakness. Negative for dizziness, sensory change, speech change, focal weakness and headaches. Seizures:   Endo/Heme/Allergies: Does not bruise/bleed easily.  Psychiatric/Behavioral: Negative for depression. The patient is not nervous/anxious.     DRUG ALLERGIES:   Allergies  Allergen Reactions  . Ampicillin Anaphylaxis  . Penicillins Anaphylaxis and Shortness Of Breath    Has patient had a PCN reaction causing immediate rash, facial/tongue/throat swelling, SOB or lightheadedness with hypotension: yes Has patient had a PCN reaction causing severe rash involving mucus membranes or skin necrosis: no Has patient had a PCN reaction that required hospitalization no Has patient had a PCN reaction occurring within the last 10 years: no If all of the above answers are "NO", then may proceed with Cephalosporin use.   . Avelox [Moxifloxacin Hcl In Nacl] Other (See Comments)    Reaction: MUSCLE CRAMPS  . Etodolac Rash  . Neurontin [Gabapentin] Anxiety    VITALS:  Blood  pressure (!) 102/59, pulse 84, temperature 97.7 F (36.5 C), temperature source Oral, resp. rate 15, height '5\' 5"'$  (1.651 m), weight 89.8 kg (198 lb), SpO2 100 %.  PHYSICAL EXAMINATION:   Physical Exam  GENERAL:  60 y.o.-year-old patient lying in the bed in no acute distress.  EYES: Pupils equal, round, reactive to light and accommodation. No scleral icterus. Extraocular muscles intact.  HEENT: Head atraumatic, normocephalic. Oropharynx and nasopharynx clear.  NECK:  Supple, no jugular venous distention. No thyroid enlargement, no tenderness.  LUNGS: Normal breath sounds bilaterally, no wheezing, rales, rhonchi. No use of accessory muscles of respiration.  CARDIOVASCULAR: S1, S2 normal. No murmurs, rubs, or gallops.  ABDOMEN: Soft, nontender, nondistended. Bowel sounds present. No organomegaly or mass.  EXTREMITIES: No cyanosis, clubbing or edema b/l.    NEUROLOGIC: Cranial nerves II through XII are intact. No focal Motor or sensory deficits b/l.  Globally weak.  PSYCHIATRIC: The patient is alert and oriented x 3.  SKIN: No obvious rash, lesion, or ulcer.   LABORATORY PANEL:   CBC  Recent Labs Lab 12/04/15 0600  WBC 8.0  HGB 9.6*  HCT 28.3*  PLT 221   ------------------------------------------------------------------------------------------------------------------ Chemistries   Recent Labs Lab 12/04/15 0600  NA 139  K 3.5  CL 107  CO2 24  GLUCOSE 150*  BUN 10  CREATININE 0.56  CALCIUM 8.2*  AST 20  ALT 19  ALKPHOS 160*  BILITOT 0.2*   ------------------------------------------------------------------------------------------------------------------  Cardiac Enzymes  Recent Labs Lab 12/02/15 2126  TROPONINI <0.03   ------------------------------------------------------------------------------------------------------------------  RADIOLOGY:  Dg Chest 1 View  Result Date: 12/02/2015 CLINICAL DATA:  Code sepsis.  Lung cancer and altered mental status EXAM:  CHEST 1 VIEW COMPARISON:  06/05/2015  FINDINGS: Chronic hazy appearance at the right base where there are lung sutures. Scar in this region on 2016 chest CT. Mild increase in left basilar markings which was also seen previously. No cardiomegaly for technique. Negative aortic and hilar contours. IMPRESSION: Stable compared to prior, including postoperative changes in the right lower lobe. No convincing pneumonia. Electronically Signed   By: Monte Fantasia M.D.   On: 12/02/2015 21:49   Ct Abdomen Pelvis W Contrast  Result Date: 12/03/2015 CLINICAL DATA:  Severe sepsis. Right lower quadrant abdominal pain. Lung cancer. EXAM: CT ABDOMEN AND PELVIS WITH CONTRAST TECHNIQUE: Multidetector CT imaging of the abdomen and pelvis was performed using the standard protocol following bolus administration of intravenous contrast. CONTRAST:  159m ISOVUE-300 IOPAMIDOL (ISOVUE-300) INJECTION 61% COMPARISON:  Head CT 07/09/2014.  Chest CT 09/25/2014 FINDINGS: Lower chest and abdominal wall: Partly seen ill-defined airspace opacity in the right middle lobe. There are new pulmonary nodules in the bibasilar lungs, largest in the right posterior costophrenic sulcus measuring 12 mm. Per report in care everywhere 10/23/2015 these are new/progressed. Septal thickening consistent with edema. Hepatobiliary: Numerous low-density liver masses that are new compared to chest CT comparison. These measure up to 3 cm in size in multiple segments. Although low-density they are likely solid. Multi focal abscess considered unlikely.Cholecystectomy. Normal common bile duct diameter. Pancreas: Unremarkable. Spleen: Unremarkable. Adrenals/Urinary Tract: Negative adrenals. Patchy heterogeneous hypo enhancement in the kidneys having ill-defined masslike appearance. Pyelonephritis is considered given the clinical circumstances, but metastases are favored. Unremarkable bladder. Stomach/Bowel:  No obstruction. No appendicitis. Reproductive:Hysterectomy.  Possible right oophorectomy. Negative left ovary. Vascular/Lymphatic: No acute vascular abnormality. Mild enlargement of lymph nodes in the deep liver drainage. Other: No ascites or pneumoperitoneum. Musculoskeletal: No acute or aggressive finding. L3-4 and L4-5 discectomy with solid bony fusion. IMPRESSION: 1. Partly seen right middle lobe pneumonia. Nodules in the bilateral lungs are primarily concerning for metastatic foci, but hematogenous infection is not excluded as these have rapidly developed compared to 10/23/2015 chest CT report. 2. Pulmonary edema. 3. Abnormal bilateral renal perfusion which could be pyelonephritis or metastases. Correlate with urinalysis and follow-up. 4. Numerous hepatic metastases. Electronically Signed   By: JMonte FantasiaM.D.   On: 12/03/2015 00:23   Dg Chest Portable 1 View  Result Date: 12/02/2015 CLINICAL DATA:  Central line placement EXAM: PORTABLE CHEST 1 VIEW COMPARISON:  Earlier today FINDINGS: New right IJ central line with tip at the SVC level. No pneumothorax or new mediastinal widening. New bilateral interstitial prominence suggesting edema. Normal heart size and stable mediastinal contours. Postoperative changes in the right lower lobe are becoming obscured. IMPRESSION: 1. New central line without adverse finding. 2. New pulmonary edema. Electronically Signed   By: JMonte FantasiaM.D.   On: 12/02/2015 23:37     ASSESSMENT AND PLAN:   * Septic Shock due to UTI and right pneumonia - off vasopressors now.   - cont. Aztreonam, Levaquin. Cultures so far (-).   * Right-sided pneumonia with malignant lung cancer - cont. Aztreonam, Levaquin. Improving.  - Afebrile now and off vasopressors - Continue oxygen and wean as tolerated  * UTI - cont. abx as mentioned above.  - Cultures so are (-)  * Stage IV lung cancer with wide metastases to brain, liver - Patient undergoing radiation chemotherapy. - appreciate Oncology input as per them Reviewed the imaging  results with the patient-liver showing multiple lesions suspicious for progressive malignancy. Patient is PDL-1 positive [70%]- should expect good response from immunotherapy. Patient  has not started therapy yet. Plan to start treatment after radiation is complete.   * Diabetes mellitus -  Cont. SSI - BS stable.   * hx of Seizures - cont. Keppra.   * Hyperlipidemia - cont. Crestor  * Right shoulder pain - PRN Toradol, Fentanyl patch.  - likely related to mets and will monitor.   All the records are reviewed and case discussed with Care Management/Social Workerr. Management plans discussed with the patient, family and they are in agreement.  CODE STATUS: FULL CODE  DVT Prophylaxis: SCDs  TOTAL CC TIME TAKING CARE OF THIS PATIENT: 30 minutes.   POSSIBLE D/C IN 2-3 DAYS, DEPENDING ON CLINICAL CONDITION.  Henreitta Leber M.D on 12/04/2015 at 4:19 PM  Between 7am to 6pm - Pager - [phone]  After 6pm go to [email or web address] - password [email or web address] [facility]  Office  [phone]  CC: Primary care physician; Cletis Athens, MD  Note: This dictation was prepared with Dragon dictation along with smaller phrase technology. Any transcriptional errors that result from this process are unintentional.

## 2015-12-04 NOTE — Evaluation (Signed)
Physical Therapy Evaluation Patient Details Name: Marie [name] MRN: [id number] DOB: 12/18/1955 Today's Date: 12/04/2015   History of Present Illness  Pt is a 60 y/o female admitted for severe sepsis. Pt was brought to the ED by EMS with hypotension, hypoxia, fever, and altered mental status with complaints of not feeling well over the past several days. Per pt chart, pt was found to have septic shock with hypotension secondary to a UTI. PMH significant for CHF, COPD, history of lung cancer, and active brain cancer undergoing radiation.  Clinical Impression  Pt is a pleasant 60 y/o female who presents with generalized weakness and difficulty walking. PLOF: Prior to admission, pt was independent with all home ambulation (room to room) with use of 4WRW, although noted it was hard to navigate walked due to small hallways. Pt also independent with ADLs, is able to use her boyfriend's chronic home O2 at 2L as needed. Pt requires min assist for bed mobility, +2 (for equipment) min assist for transfers, and +2 (for equipment) min-mod assist ambulation (62f). Pt is primarily limited due to light headedness and O2 desat throughout session. Slight unsteadiness x 2 with ambulation, requiring min-mod assist from PT to maintain balance. Pt reports feeling light headed/dizzy, seated rest break for ~ 6 mins. Pt O2 dropped to 72% while seated but bounced back quickly within 15 seconds to 95%, pursed lip breathing encouraged throughout the session. BP: Supine- 105/65 Seated-99/58 Supine post ambulation: 88/65. Pt will benefit from skilled PT services in order to improve strength, ROM, balance, endurance, functional mobility, and safety with DME use. At this time pt is appropriate for STR to address deficits.      Follow Up Recommendations SNF    Equipment Recommendations  Rolling walker with 5" wheels    Recommendations for Other Services       Precautions / Restrictions Precautions Precautions:  Fall Restrictions Weight Bearing Restrictions: No      Mobility  Bed Mobility Overal bed mobility: Needs Assistance Bed Mobility: Supine to Sit     Supine to sit: Min assist     General bed mobility comments: Pt able to swing legs off of bed, requires min assist to lift trunk off of bed. Verbal cueing to scoot to EOB. BP in seated 99/58.  Transfers Overall transfer level: Needs assistance Equipment used: Rolling walker (2 wheeled) Transfers: Sit to/from Stand Sit to Stand: Min assist;+2 safety/equipment         General transfer comment: Pt requires min assist and mod verbal cueing for hand placement on RW and for proper sit to stand technique. Pt reports feeling light head and needs to sit before continuing. Light headedness subsides within 1 min and pt is able to sit/stand again with no symptoms.   Ambulation/Gait Ambulation/Gait assistance: Min assist;Mod assist;+2 safety/equipment Ambulation Distance (Feet): 5 Feet Assistive device: Rolling walker (2 wheeled) Gait Pattern/deviations: Step-to pattern;Decreased step length - left;Decreased stance time - left   Gait velocity interpretation: Below normal speed for age/gender General Gait Details: Pt requires verbal cueing for proper sequencing of movements and to keep RW close to body for safety. Pt demonstrates slight unsteadiness x 2, requiring min-mod assist from PT to maintain standing balance. Pt reports feeling light headed and dizzy and needs seated rest break for ~ 6 mins. Pt O2 dropped to 72% while sitting in the chair but bounced back quickly within 15 seconds, pursed lip breathing encouraged throughout the session.  Stairs  Wheelchair Mobility    Modified Rankin (Stroke Patients Only)       Balance Overall balance assessment: Needs assistance Sitting-balance support: Bilateral upper extremity supported;Feet supported Sitting balance-Leahy Scale: Good Sitting balance - Comments: Pt requires  min guard to maintain sitting balance with B UE support.   Standing balance support: Bilateral upper extremity supported Standing balance-Leahy Scale: Fair Standing balance comment: Pt requires min assist from PT to maintain standing balance with B UE support due to episodes of light headedness and dizziness.                             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 Pt states that her and her boyfriend are the primary caretakers for each other. Pt reports her boyfriend has chronic home O2 2L and she is able to use it if needed. Pt states that she has always had trouble navigating her 5 steps and has fallen a couple of time. She has another set of stairs (6) with one railing but she reports she does not feel safe using those.     Prior Function Level of Independence: Independent with assistive device(s)         Comments: Pt was independent with household ambulation (room to room) with 4WRW, no community ambulation. Pt also reports she is independent with all ADLs.      Hand Dominance        Extremity/Trunk Assessment   Upper Extremity Assessment: Overall WFL for tasks assessed (UE grossly 5/5)           Lower Extremity Assessment: Generalized weakness (R LE grossly 4+/5 and L LE at least  3/5)         Communication   Communication: No difficulties  Cognition Arousal/Alertness: Awake/alert Behavior During Therapy: WFL for tasks assessed/performed Overall Cognitive Status: Within Functional Limits for tasks assessed                      General Comments General comments (skin integrity, edema, etc.): Ulcers on L foot, pt reports she is able to walk on them    Exercises Other Exercises Other Exercises: Gait training x 5 ft with verbal cueing to keep L hand on RW and to keep RW close to body for safety. Pt also requires verbal cueing for proper sequencing of movement. Pursed lip breathing encouraged with ambulation.      Assessment/Plan    PT Assessment Patient needs continued PT services  PT Diagnosis Generalized weakness;Difficulty walking   PT Problem List Decreased strength;Decreased range of motion;Decreased activity tolerance;Decreased balance;Decreased mobility;Decreased coordination;Decreased cognition;Decreased knowledge of use of DME;Decreased safety awareness;Pain  PT Treatment Interventions DME instruction;Gait training;Stair training;Functional mobility training;Therapeutic activities;Therapeutic exercise;Balance training;Neuromuscular re-education;Patient/family education   PT Goals (Current goals can be found in the Care Plan section) Acute Rehab PT Goals Patient Stated Goal: To feel better and return home PT Goal  Formulation: With patient Time For Goal Achievement: 12/18/15 Potential to Achieve Goals: Good    Frequency Min 2X/week   Barriers to discharge Inaccessible home environment;Decreased caregiver support Pt reports it is hard to get around her house with an AD because the hallways are too narrow. Pt and her boyfriend with "bone cancer" and primary caregivers for themselves and her son. Pt reports that if one of them is up and moving around the house, the other stays sitting just in case they need to call for help.    Co-evaluation                End of Session Equipment Utilized During Treatment: Gait belt;Oxygen Activity Tolerance: Patient limited by fatigue;Treatment limited secondary to medical complications (Comment) (O2, BP, light headed, dizzy) Patient left: in bed;with call bell/phone within reach (Bed alarm not working, Therapist, sports notified) Nurse Communication: Mobility status         Time: [phone] PT Time Calculation (min) (ACUTE ONLY): 38 min   Charges:   PT Evaluation $PT Eval Moderate Complexity: 1 Procedure PT Treatments $Gait Training: 8-22 mins   PT G Codes:        Georgina Pillion 24-Dec-2015, 5:04 PM  Georgina Pillion, SPT [phone]

## 2015-12-04 NOTE — Progress Notes (Signed)
Inpatient Diabetes Program Recommendations  AACE/ADA: New Consensus Statement on Inpatient Glycemic Control (2015)  Target Ranges:  Prepandial:   less than 140 mg/dL      Peak postprandial:   less than 180 mg/dL (1-2 hours)      Critically ill patients:  140 - 180 mg/dL   Lab Results  Component Value Date   GLUCAP 226 (H) 12/04/2015   HGBA1C 11.4 (H) 12/03/2015    Review of Glycemic Control  Results for Marie [name], Marie [name] (MRN [id number]) as of 12/04/2015 12:48  Ref. Range 12/03/2015 12:15 12/03/2015 16:31 12/03/2015 22:05 12/04/2015 07:30 12/04/2015 11:38  Glucose-Capillary Latest Ref Range: 65 - 99 mg/dL 249 (H) 308 (H) 162 (H) 154 (H) 226 (H)     Diabetes history:  Type 2 Outpatient Diabetes medications: Glipizide '5mg'$  bid,  Metformin '500mg'$  bid, Toujeo '90mg'$ /day,(with steroids) Current orders for Inpatient glycemic control: Novolog 0-9 units tid, Novolog 0-5 units qhs  Inpatient Diabetes Program Recommendations:  Post prandial blood sugars remain elevated despite current Novolog correction score-consider increasing Novolog correction to moderate correction scale 0-15 units tid.  Gentry Fitz, RN, BA, MHA, CDE Diabetes Coordinator Inpatient Diabetes Program  [phone] (Team Pager) [phone] ([place]) 12/04/2015 1:05 PM

## 2015-12-04 NOTE — Care Management Note (Addendum)
Case Management Note  Patient Details  Name: Marie [name] MRN: [id number] Date of Birth: Jan 19, 1956  Subjective/Objective:                  Met with patient to discuss discharge planning. She states she no longer has health insurance as her medical expenses have escalated out of her control. She is currently receiving radiation at our cancer center. She states that she has also received assistance through [facility]. She states she cannot afford her medications and that we/[facility] has been helping her. She lives with her boyfriend Marie [name] that is currently being treated for "bone cancer". Neither one of them are supposed to drive "but they do what they have to in order to get to appointments. She states that they have a son living there too but he is autistic and ADHD and therefore cannot drive either (legally). She has PRN O2 at home that is available if needed. I understand that the O2 is prescribed to her boyfriend Marie [name]. She has a walker but "it's too large to get around the house" and she requests "something more manageable".   Action/Plan:  I have given heads up to [facility] in [facility] and Hilary Hertz with [facility] of patient admission and potential need too. PT requested from MD to assist with DME needs. RNCM to continue to follow.   The only assistance that patient may be able to receive would be through [facility] or whatever resources Barnabas Lister at [facility] has been using. RNCM cannot refer patient to [facility], [facility], or Medication management because she has Medicare in place regardless of drug coverage which apparently she does not have and she has a high deductible plan.  I met again with patient and explained that she would not be able to receive assistance from CM due to having Medicare. This is a medicare rule. I have advised patient to apply for Medicaid and she says she will when she leaves hospital. Marie [name] (boyfriend) already has medicaid in place for him.   Expected  Discharge Date:                  Expected Discharge Plan:     In-House Referral:     Discharge planning Services  CM Consult, Medication Assistance, [facility], [facility]  Post Acute Care Choice:  Durable Medical Equipment Choice offered to:  Patient  DME Arranged:    DME Agency:     HH Arranged:    [place] Agency:     Status of Service:  In process, will continue to follow  If discussed at Long Length of Stay Meetings, dates discussed:    Additional Comments:  Marshell Garfinkel, RN 12/04/2015, 12:06 PM

## 2015-12-04 NOTE — Progress Notes (Signed)
Pharmacy Antibiotic Note  Marie [name] is a 60 y.o. female admitted on 12/02/2015 with sepsis.  Pharmacy has been consulted for aztreonam and Levaquin dosing.  Plan: Aztreonam 2 grams q 8 hours ordered.  Levaquin 750 mg q 24 hours ordered.  Height: '5\' 5"'$  (165.1 cm) Weight:  (bedscale weighing pt as 0.1kg ) IBW/kg (Calculated) : 57  Temp (24hrs), Avg:98 F (36.7 C), Min:97.7 F (36.5 C), Max:98.4 F (36.9 C)   Recent Labs Lab 12/02/15 2126 12/02/15 2203 12/03/15 0207 12/03/15 0842 12/04/15 0600  WBC 10.4  --   --  11.7* 8.0  CREATININE 1.22*  --   --  0.81 0.56  LATICACIDVEN  --  1.6 1.1  --   --     Estimated Creatinine Clearance: 84.8 mL/min (by C-G formula based on SCr of 0.8 mg/dL).    Allergies  Allergen Reactions  . Ampicillin Anaphylaxis  . Penicillins Anaphylaxis and Shortness Of Breath    Has patient had a PCN reaction causing immediate rash, facial/tongue/throat swelling, SOB or lightheadedness with hypotension: yes Has patient had a PCN reaction causing severe rash involving mucus membranes or skin necrosis: no Has patient had a PCN reaction that required hospitalization no Has patient had a PCN reaction occurring within the last 10 years: no If all of the above answers are "NO", then may proceed with Cephalosporin use.   . Avelox [Moxifloxacin Hcl In Nacl] Other (See Comments)    Reaction: MUSCLE CRAMPS  . Etodolac Rash  . Neurontin [Gabapentin] Anxiety    Antimicrobials this admission: Vancomycin 8/23  >> 8/24  aztreonam 8/23 >>  Levaquin 8/23 >>  Dose adjustments this admission:   Microbiology results: 8/24 MRSA PCR: negative 8/23 BCx: NGTD x 2 8/23 UCx: GNR   8/23 UA: LE(+) NO2(+) WBC TNTC 8/23 CXR: "no convincing pneumonia"  Thank you for allowing pharmacy to be a part of this patient's care.  Napoleon Form 12/04/2015 3:27 PM

## 2015-12-04 NOTE — Progress Notes (Signed)
[facility] Progess Note    ASSESSMENT/PLAN   60 year old female lung cancer, with known metastases to brain, now with new metastases to liver and lung. Presenting with UTI and sepsis with possible pneumonia.  PULMONARY A:Primary right lung cancer, status post chemotherapy, now with metastatic disease to brain, liver, right lung. -Right middle lobe pneumonia. -COPD/emphysema. P:   -Will treat pneumonia/UIT  with aztreonam/Levaquin. -Discussed with patient in regards new findings of metastatic disease to liver and recurrence in the lung.  CARDIOVASCULAR A: s/p Septic shock with hypotension. -Appears improved. Lactic acid is negative P:  Continue IV fluids, levophed has been weaned off.  --Patient reports that her blood pressure is normally low, with a systolic in the 62Z, therefore, we'll tolerate a lower blood pressure in her, as long as she shows no clinical signs of hypotension or hypoperfusion.  RENAL/urinary A:  UTI with sepsis. P:   -Antibiotics as above.  GASTROINTESTINAL A: Abdominal pain and right upper quadrant, likely due to new liver metastases. Liver function testing appears normal. P:   GI prophylaxis, advance diet.  HEMATOLOGIC A:  Initial stage IB lung cancer, now with metastatic disease.  INFECTIOUS A:  Urinary tract infection with sepsis, pneumonia P:    Micro/culture results: U/a positive; urine culture pending.  BCx2 8/23; negative thus far.  UC 8/23; pending.  Sputum-- MRSA PCR negative.   Antibiotics:  vancomycin>> 8/24 aztreonam>> Levaquin >>  ENDOCRINE A: Mildly elevated blood glucose. P:   Start sliding scale insulin.  NEUROLOGIC A:  Acute metabolic cephalopathy, likely secondary to sepsis. -Brain metastases. P:   -Continue treatment of sepsis, status post   MAJOR EVENTS/TEST RESULTS:   Best Practices  DVT Prophylaxis: Enoxaparin GI Prophylaxis:  Famotidine   --------------------------------------- Advanced care planning note 12/04/15:  Discussed the patient's new CT findings which include new metastases to liver and recurrence in the right lung. Appears that her cancer appears to be progressing she is scheduled to undergo chemotherapy starting later this month. She is anxious to get is underway. She understands her condition and prognosis are poor, plus to continue to try with possible. Therefore she maintains her CODE STATUS remains full code. Terms of her medical decision-making, she would like to defer any medical decisions she is unable to make them to her boyfriend, she felt that her 2 children understand this and are in agreement with this. 30 minutes spent advanced care planning discussions.   ---------------------------------------   ----------------------------------------   Name: Marie [name] MRN: [id number] DOB: 1956/03/12    ADMISSION DATE:  12/02/2015    SUBJECTIVE:   No acute events, patient notes that she is feeling better.  Review of Systems:  Constitutional: Feels well. Cardiovascular: No chest pain.  Pulmonary: Denies dyspnea.   The remainder of systems were reviewed and were found to be negative other than what is documented in the HPI.    VITAL SIGNS: Temp:  [97.7 F (36.5 C)-98.7 F (37.1 C)] 97.7 F (36.5 C) (08/25 0800) Pulse Rate:  [82-102] 82 (08/25 0800) Resp:  [15-22] 17 (08/25 0800) BP: (83-113)/(46-67) 86/59 (08/25 0800) SpO2:  [89 %-100 %] 96 % (08/25 0800) Weight:  [198 lb (89.8 kg)] 198 lb (89.8 kg) (08/24 0920) HEMODYNAMICS:   VENTILATOR SETTINGS:   INTAKE / OUTPUT:  Intake/Output Summary (Last 24 hours) at 12/04/15 0847 Last data filed at 12/04/15 0800  Gross per 24 hour  Intake          2389.82 ml  Output  6725 ml  Net         -4335.18 ml    PHYSICAL EXAMINATION: Physical Examination:   VS: BP (!) 86/59   Pulse 82   Temp 97.7 F (36.5 C)   Resp 17    Ht '5\' 5"'$  (1.651 m)   Wt 198 lb (89.8 kg)   SpO2 96%   BMI 32.95 kg/m   General Appearance: No distress  Neuro:without focal findings, mental status normal. HEENT: PERRLA, EOM intact. Pulmonary: Kreps in right lung. CardiovascularNormal S1,S2.  No m/r/g.   Abdomen: Benign, Soft, non-tender. Renal:  No costovertebral tenderness  GU:  Not performed at this time. Endocrine: No evident thyromegaly. Skin:   warm, no rashes, no ecchymosis  Extremities: normal, no cyanosis, clubbing.   LABS:   LABORATORY PANEL:   CBC  Recent Labs Lab 12/04/15 0600  WBC 8.0  HGB 9.6*  HCT 28.3*  PLT 221    Chemistries   Recent Labs Lab 12/04/15 0600  NA 139  K 3.5  CL 107  CO2 24  GLUCOSE 150*  BUN 10  CREATININE 0.56  CALCIUM 8.2*  AST 20  ALT 19  ALKPHOS 160*  BILITOT 0.2*     Recent Labs Lab 12/03/15 0913 12/03/15 1215 12/03/15 1631 12/03/15 2205 12/04/15 0730  GLUCAP 163* 249* 308* 162* 154*   No results for input(s): PHART, PCO2ART, PO2ART in the last 168 hours.  Recent Labs Lab 12/02/15 2126 12/04/15 0600  AST 22 20  ALT 19 19  ALKPHOS 155* 160*  BILITOT 0.5 0.2*  ALBUMIN 2.5* 2.1*    Cardiac Enzymes  Recent Labs Lab 12/02/15 2126  TROPONINI <0.03    RADIOLOGY:  Dg Chest 1 View  Result Date: 12/02/2015 CLINICAL DATA:  Code sepsis.  Lung cancer and altered mental status EXAM: CHEST 1 VIEW COMPARISON:  06/05/2015 FINDINGS: Chronic hazy appearance at the right base where there are lung sutures. Scar in this region on 2016 chest CT. Mild increase in left basilar markings which was also seen previously. No cardiomegaly for technique. Negative aortic and hilar contours. IMPRESSION: Stable compared to prior, including postoperative changes in the right lower lobe. No convincing pneumonia. Electronically Signed   By: Monte Fantasia M.D.   On: 12/02/2015 21:49   Ct Abdomen Pelvis W Contrast  Result Date: 12/03/2015 CLINICAL DATA:  Severe sepsis. Right  lower quadrant abdominal pain. Lung cancer. EXAM: CT ABDOMEN AND PELVIS WITH CONTRAST TECHNIQUE: Multidetector CT imaging of the abdomen and pelvis was performed using the standard protocol following bolus administration of intravenous contrast. CONTRAST:  187m ISOVUE-300 IOPAMIDOL (ISOVUE-300) INJECTION 61% COMPARISON:  Head CT 07/09/2014.  Chest CT 09/25/2014 FINDINGS: Lower chest and abdominal wall: Partly seen ill-defined airspace opacity in the right middle lobe. There are new pulmonary nodules in the bibasilar lungs, largest in the right posterior costophrenic sulcus measuring 12 mm. Per report in care everywhere 10/23/2015 these are new/progressed. Septal thickening consistent with edema. Hepatobiliary: Numerous low-density liver masses that are new compared to chest CT comparison. These measure up to 3 cm in size in multiple segments. Although low-density they are likely solid. Multi focal abscess considered unlikely.Cholecystectomy. Normal common bile duct diameter. Pancreas: Unremarkable. Spleen: Unremarkable. Adrenals/Urinary Tract: Negative adrenals. Patchy heterogeneous hypo enhancement in the kidneys having ill-defined masslike appearance. Pyelonephritis is considered given the clinical circumstances, but metastases are favored. Unremarkable bladder. Stomach/Bowel:  No obstruction. No appendicitis. Reproductive:Hysterectomy. Possible right oophorectomy. Negative left ovary. Vascular/Lymphatic: No acute vascular abnormality. Mild enlargement of lymph  nodes in the deep liver drainage. Other: No ascites or pneumoperitoneum. Musculoskeletal: No acute or aggressive finding. L3-4 and L4-5 discectomy with solid bony fusion. IMPRESSION: 1. Partly seen right middle lobe pneumonia. Nodules in the bilateral lungs are primarily concerning for metastatic foci, but hematogenous infection is not excluded as these have rapidly developed compared to 10/23/2015 chest CT report. 2. Pulmonary edema. 3. Abnormal bilateral  renal perfusion which could be pyelonephritis or metastases. Correlate with urinalysis and follow-up. 4. Numerous hepatic metastases. Electronically Signed   By: Monte Fantasia M.D.   On: 12/03/2015 00:23   Dg Chest Portable 1 View  Result Date: 12/02/2015 CLINICAL DATA:  Central line placement EXAM: PORTABLE CHEST 1 VIEW COMPARISON:  Earlier today FINDINGS: New right IJ central line with tip at the SVC level. No pneumothorax or new mediastinal widening. New bilateral interstitial prominence suggesting edema. Normal heart size and stable mediastinal contours. Postoperative changes in the right lower lobe are becoming obscured. IMPRESSION: 1. New central line without adverse finding. 2. New pulmonary edema. Electronically Signed   By: Monte Fantasia M.D.   On: 12/02/2015 23:37       --Marda Stalker, MD.  ICU Pager: [phone] [place] Office Number: [phone]  Patricia Pesa, M.D.  Vilinda Boehringer, M.D.  Merton Border, M.D  12/04/2015

## 2015-12-04 NOTE — Progress Notes (Signed)
Spoke with MD, Dr. Tor Netters, about patient's pain level with prn oxycodone not available yet and patient in increased pain after working with PT. RN gave prn tylenol dose. MD ordered patient's previous one time toradol dose of 30 mg IV every 6 hours now prn, but stated that patient wouldn't be able to get it until 6 hours after the previous dose. MD did order for patient to start fentanyl patch of 12.5 to help with patient's continued pain. Team will continue monitor.

## 2015-12-05 LAB — GLUCOSE, CAPILLARY
GLUCOSE-CAPILLARY: 199 mg/dL — AB (ref 65–99)
GLUCOSE-CAPILLARY: 166 mg/dL — AB (ref 65–99)
GLUCOSE-CAPILLARY: 239 mg/dL — AB (ref 65–99)
Glucose-Capillary: 227 mg/dL — ABNORMAL HIGH (ref 65–99)

## 2015-12-05 LAB — CBC
HEMATOCRIT: 29 % — AB (ref 35.0–47.0)
Hemoglobin: 9.9 g/dL — ABNORMAL LOW (ref 12.0–16.0)
MCH: 30.1 pg (ref 26.0–34.0)
MCHC: 34.2 g/dL (ref 32.0–36.0)
MCV: 88.1 fL (ref 80.0–100.0)
PLATELETS: 239 10*3/uL (ref 150–440)
RBC: 3.29 MIL/uL — ABNORMAL LOW (ref 3.80–5.20)
RDW: 14.3 % (ref 11.5–14.5)
WBC: 9.4 10*3/uL (ref 3.6–11.0)

## 2015-12-05 LAB — URINE CULTURE

## 2015-12-05 LAB — BASIC METABOLIC PANEL
Anion gap: 9 (ref 5–15)
BUN: 8 mg/dL (ref 6–20)
CALCIUM: 8.4 mg/dL — AB (ref 8.9–10.3)
CO2: 23 mmol/L (ref 22–32)
Chloride: 105 mmol/L (ref 101–111)
Creatinine, Ser: 0.63 mg/dL (ref 0.44–1.00)
GFR calc Af Amer: >60 mL/min (ref >60)
GLUCOSE: 205 mg/dL — AB (ref 65–99)
POTASSIUM: 3.3 mmol/L — AB (ref 3.5–5.1)
Sodium: 137 mmol/L (ref 135–145)

## 2015-12-05 MED ORDER — POLYETHYLENE GLYCOL 3350 17 G PO PACK
17.0000 g | PACK | Freq: Every day | ORAL | Status: DC
  Administered 2015-12-05 – 2015-12-07 (×3): 17 g via ORAL
  Filled 2015-12-05 (×3): qty 1

## 2015-12-05 MED ORDER — GUAIFENESIN-DM 100-10 MG/5ML PO SYRP
5.0000 mL | ORAL_SOLUTION | ORAL | Status: DC | PRN
  Administered 2015-12-05: 5 mL via ORAL
  Filled 2015-12-05: qty 5

## 2015-12-05 NOTE — Progress Notes (Signed)
[facility] Progess Note    ASSESSMENT/PLAN   60 year old female lung cancer, with known metastases to brain, now with new metastases to liver and lung. Presenting with UTI and sepsis with possible pneumonia.  Primary right lung cancer, status post chemotherapy, now with metastatic disease to brain, liver, right lung. -Right middle lobe pneumonia. -COPD/emphysema. P:   -continue abx. Pt now seems to be doing better, and is stable on 3L [place].  --OK to discharge from respiratory standpoint with continue course of abx.   Pulmonary service will sign off for now, please call if there are any further questions or concerns.  ---------------------------------------   ----------------------------------------   Name: Marie [name] MRN: [id number] DOB: 1956/02/14    ADMISSION DATE:  12/02/2015    SUBJECTIVE:   No acute events, patient notes that she is feeling better. Asking to go home.   Review of Systems:  Constitutional: Feels well. Cardiovascular: No chest pain.  Pulmonary: Denies dyspnea.   The remainder of systems were reviewed and were found to be negative other than what is documented in the HPI.    VITAL SIGNS: Temp:  [97.9 F (36.6 C)-98 F (36.7 C)] 97.9 F (36.6 C) (08/26 0411) Pulse Rate:  [81-117] 102 (08/26 0448) Resp:  [15-20] 20 (08/26 0411) BP: (90-123)/(52-94) 123/65 (08/26 0411) SpO2:  [89 %-100 %] 99 % (08/26 0411) Weight:  [186 lb 14.4 oz (84.8 kg)] 186 lb 14.4 oz (84.8 kg) (08/26 0453) HEMODYNAMICS:   VENTILATOR SETTINGS:   INTAKE / OUTPUT:  Intake/Output Summary (Last 24 hours) at 12/05/15 0958 Last data filed at 12/05/15 0700  Gross per 24 hour  Intake           1847.5 ml  Output             2500 ml  Net           -652.5 ml    PHYSICAL EXAMINATION: Physical Examination:   VS: BP 123/65 (BP Location: Left Arm)   Pulse (!) 102   Temp 97.9 F (36.6 C) (Oral)   Resp 20   Ht '5\' 5"'$  (1.651 m)   Wt 186 lb 14.4 oz  (84.8 kg)   SpO2 99%   BMI 31.10 kg/m   General Appearance: No distress  Neuro:without focal findings, mental status normal. HEENT: PERRLA, EOM intact. Pulmonary: Kreps in right lung. CardiovascularNormal S1,S2.  No m/r/g.   Abdomen: Benign, Soft, non-tender. Renal:  No costovertebral tenderness  GU:  Not performed at this time. Endocrine: No evident thyromegaly. Skin:   warm, no rashes, no ecchymosis  Extremities: normal, no cyanosis, clubbing.   LABS:   LABORATORY PANEL:   CBC  Recent Labs Lab 12/05/15 0618  WBC 9.4  HGB 9.9*  HCT 29.0*  PLT 239    Chemistries   Recent Labs Lab 12/04/15 0600 12/05/15 0618  NA 139 137  K 3.5 3.3*  CL 107 105  CO2 24 23  GLUCOSE 150* 205*  BUN 10 8  CREATININE 0.56 0.63  CALCIUM 8.2* 8.4*  AST 20  --   ALT 19  --   ALKPHOS 160*  --   BILITOT 0.2*  --      Recent Labs Lab 12/03/15 2205 12/04/15 0730 12/04/15 1138 12/04/15 1758 12/04/15 2158 12/05/15 0736  GLUCAP 162* 154* 226* 271* 194* 227*   No results for input(s): PHART, PCO2ART, PO2ART in the last 168 hours.  Recent Labs Lab 12/02/15 2126 12/04/15 0600  AST 22 20  ALT 19 19  ALKPHOS 155* 160*  BILITOT 0.5 0.2*  ALBUMIN 2.5* 2.1*    Cardiac Enzymes  Recent Labs Lab 12/02/15 2126  TROPONINI <0.03    RADIOLOGY:  No results found.     Marda Stalker, MD.  ICU Pager: [phone] [facility] Office Number: [phone]  Patricia Pesa, M.D.  Vilinda Boehringer, M.D.  Merton Border, M.D  12/05/2015

## 2015-12-05 NOTE — Progress Notes (Signed)
[facility] at [place] NAME: Marie [name]    MR#:  [id number]  DATE OF BIRTH:  1955-05-05  SUBJECTIVE:   Right shoulder pain improved. Transferred out of the ICU yesterday. Remains off vasopressors and hemodynamically stable. Afebrile. Urine culture positive for Escherichia coli.  REVIEW OF SYSTEMS:    Review of Systems  Constitutional: Negative for chills, fever and malaise/fatigue.  HENT: Negative for sore throat.   Eyes: Negative for blurred vision, double vision and pain.  Respiratory: Negative for cough, hemoptysis, shortness of breath and wheezing.   Cardiovascular: Negative for chest pain, palpitations, orthopnea and leg swelling.  Gastrointestinal: Negative for abdominal pain, constipation, diarrhea, heartburn, nausea and vomiting.  Genitourinary: Negative for dysuria and hematuria.  Musculoskeletal: Positive for joint pain (right shoulder). Negative for back pain.  Skin: Negative for rash.  Neurological: Positive for weakness. Negative for dizziness, sensory change, speech change, focal weakness and headaches. Seizures:   Endo/Heme/Allergies: Does not bruise/bleed easily.  Psychiatric/Behavioral: Negative for depression. The patient is not nervous/anxious.     DRUG ALLERGIES:   Allergies  Allergen Reactions  . Ampicillin Anaphylaxis  . Penicillins Anaphylaxis and Shortness Of Breath    Has patient had a PCN reaction causing immediate rash, facial/tongue/throat swelling, SOB or lightheadedness with hypotension: yes Has patient had a PCN reaction causing severe rash involving mucus membranes or skin necrosis: no Has patient had a PCN reaction that required hospitalization no Has patient had a PCN reaction occurring within the last 10 years: no If all of the above answers are "NO", then may proceed with Cephalosporin use.   . Avelox [Moxifloxacin Hcl In Nacl] Other (See Comments)    Reaction: MUSCLE CRAMPS  . Etodolac  Rash  . Neurontin [Gabapentin] Anxiety    VITALS:  Blood pressure 123/65, pulse (!) 102, temperature 97.9 F (36.6 C), temperature source Oral, resp. rate 20, height '5\' 5"'$  (1.651 m), weight 84.8 kg (186 lb 14.4 oz), SpO2 99 %.  PHYSICAL EXAMINATION:   Physical Exam  GENERAL:  60 y.o.-year-old patient lying in the bed in no acute distress.  EYES: Pupils equal, round, reactive to light and accommodation. No scleral icterus. Extraocular muscles intact.  HEENT: Head atraumatic, normocephalic. Oropharynx and nasopharynx clear.  NECK:  Supple, no jugular venous distention. No thyroid enlargement, no tenderness.  LUNGS: Normal breath sounds bilaterally, no wheezing, rales, rhonchi. No use of accessory muscles of respiration.  CARDIOVASCULAR: S1, S2 normal. No murmurs, rubs, or gallops.  ABDOMEN: Soft, nontender, nondistended. Bowel sounds present. No organomegaly or mass.  EXTREMITIES: No cyanosis, clubbing or edema b/l.    NEUROLOGIC: Cranial nerves II through XII are intact. No focal Motor or sensory deficits b/l.  Globally weak.  PSYCHIATRIC: The patient is alert and oriented x 3.  SKIN: No obvious rash, lesion, or ulcer.   LABORATORY PANEL:   CBC  Recent Labs Lab 12/05/15 0618  WBC 9.4  HGB 9.9*  HCT 29.0*  PLT 239   ------------------------------------------------------------------------------------------------------------------ Chemistries   Recent Labs Lab 12/04/15 0600 12/05/15 0618  NA 139 137  K 3.5 3.3*  CL 107 105  CO2 24 23  GLUCOSE 150* 205*  BUN 10 8  CREATININE 0.56 0.63  CALCIUM 8.2* 8.4*  AST 20  --   ALT 19  --   ALKPHOS 160*  --   BILITOT 0.2*  --    ------------------------------------------------------------------------------------------------------------------  Cardiac Enzymes  Recent Labs Lab 12/02/15 2126  TROPONINI <0.03    ------------------------------------------------------------------------------------------------------------------  RADIOLOGY:  No results found.   ASSESSMENT AND PLAN:   * Septic Shock due to UTI and right sided pneumonia - off vasopressors now.   - Urine cultures + for E.coli and it's pansensitive.  - d/c Aztreonam,  Cont. Levaquin.   * Right-sided pneumonia with malignant lung cancer - cont. Levaquin and d/c Aztreonam - Afebrile now and off vasopressors - Continue oxygen and wean as tolerated  * UTI - urine cultures + for E. Coli.  - cont. Levaquin. D/c Aztreonam.   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 - appreciate Oncology input as per them Reviewed the imaging results with the patient-liver showing multiple lesions suspicious for progressive malignancy. Patient is PDL-1 positive [70%]- should expect good response from immunotherapy. Patient has not started therapy yet. Plan to start treatment after radiation is complete.   * Diabetes mellitus -  Cont. SSI - BS stable.   * hx of Seizures - cont. Keppra.   * Hyperlipidemia - cont. Crestor  * Right shoulder pain - PRN Toradol, Fentanyl patch.  - likely related to mets and will monitor. Much improved.  Physical therapy evaluation recommended short-term rehabilitation. Patient prefers to go home. I will have physical therapy to evaluate patient.  All the records are reviewed and case discussed with Care Management/Social Workerr. Management plans discussed with the patient, family and they are in agreement.  CODE STATUS: FULL CODE  DVT Prophylaxis: SCDs  TOTAL CC TIME TAKING CARE OF THIS PATIENT: 30 minutes.   POSSIBLE D/C IN 1-2 DAYS, DEPENDING ON CLINICAL CONDITION.  Henreitta Leber M.D on 12/05/2015 at 12:31 PM  Between 7am to 6pm - Pager - [phone]  After 6pm go to [email or web address] - password [email or web address] [facility]   Office  [phone]  CC: Primary care physician; Cletis Athens, MD  Note: This dictation was prepared with Dragon dictation along with smaller phrase technology. Any transcriptional errors that result from this process are unintentional.

## 2015-12-05 NOTE — Progress Notes (Signed)
PT Cancellation Note  Patient Details Name: [name] MRN: [id number] DOB: Jan 08, 1956   Cancelled Treatment:    Reason Eval/Treat Not Completed: Patient declined, no reason specified. Patient does not feel up to having therapy today and declined.  Alanson Puls, PT, DPT [place], [place] S 12/05/2015, 3:10 PM

## 2015-12-06 LAB — GLUCOSE, CAPILLARY
GLUCOSE-CAPILLARY: 197 mg/dL — AB (ref 65–99)
GLUCOSE-CAPILLARY: 146 mg/dL — AB (ref 65–99)
Glucose-Capillary: 224 mg/dL — ABNORMAL HIGH (ref 65–99)
Glucose-Capillary: 172 mg/dL — ABNORMAL HIGH (ref 65–99)

## 2015-12-06 LAB — POTASSIUM: POTASSIUM: 3.2 mmol/L — AB (ref 3.5–5.1)

## 2015-12-06 MED ORDER — POTASSIUM CHLORIDE CRYS ER 20 MEQ PO TBCR
20.0000 meq | EXTENDED_RELEASE_TABLET | Freq: Two times a day (BID) | ORAL | Status: AC
  Administered 2015-12-06 (×2): 20 meq via ORAL
  Filled 2015-12-06 (×2): qty 1

## 2015-12-06 MED ORDER — SODIUM CHLORIDE 0.9 % IV BOLUS (SEPSIS): 500.0000 mL | INTRAVENOUS | Status: DC

## 2015-12-06 MED ORDER — BISACODYL 10 MG RE SUPP
10.0000 mg | Freq: Once | RECTAL | Status: AC
  Administered 2015-12-06: 14:00:00 10 mg via RECTAL
  Filled 2015-12-06: qty 1

## 2015-12-06 NOTE — Progress Notes (Signed)
[facility] at [place] NAME: Marie [name]    MR#:  [id number]  DATE OF BIRTH:  [birth year]  SUBJECTIVE:   Says that she feels lousy and weak overall.  Afebrile, hemodynamically stable.    REVIEW OF SYSTEMS:    Review of Systems  Constitutional: Negative for chills, fever and malaise/fatigue.  HENT: Negative for sore throat.   Eyes: Negative for blurred vision, double vision and pain.  Respiratory: Negative for cough, hemoptysis, shortness of breath and wheezing.   Cardiovascular: Negative for chest pain, palpitations, orthopnea and leg swelling.  Gastrointestinal: Negative for abdominal pain, constipation, diarrhea, heartburn, nausea and vomiting.  Genitourinary: Negative for dysuria and hematuria.  Musculoskeletal: Negative for back pain and joint pain (right shoulder).  Skin: Negative for rash.  Neurological: Positive for weakness. Negative for dizziness, sensory change, speech change, focal weakness and headaches. Seizures:   Endo/Heme/Allergies: Does not bruise/bleed easily.  Psychiatric/Behavioral: Negative for depression. The patient is not nervous/anxious.     DRUG ALLERGIES:   Allergies  Allergen Reactions  . Ampicillin Anaphylaxis  . Penicillins Anaphylaxis and Shortness Of Breath    Has patient had a PCN reaction causing immediate rash, facial/tongue/throat swelling, SOB or lightheadedness with hypotension: yes Has patient had a PCN reaction causing severe rash involving mucus membranes or skin necrosis: no Has patient had a PCN reaction that required hospitalization no Has patient had a PCN reaction occurring within the last 10 years: no If all of the above answers are "NO", then may proceed with Cephalosporin use.   . Avelox [Moxifloxacin Hcl In Nacl] Other (See Comments)    Reaction: MUSCLE CRAMPS  . Etodolac Rash  . Neurontin [Gabapentin] Anxiety    VITALS:  Blood pressure 121/64, pulse (!) 107, temperature  97.7 F (36.5 C), temperature source Oral, resp. rate 18, height '5\' 5"'$  (1.651 m), weight 90.1 kg (198 lb 9.6 oz), SpO2 95 %.  PHYSICAL EXAMINATION:   Physical Exam  GENERAL:  60 y.o.-year-old patient lying in the bed in no acute distress.  EYES: Pupils equal, round, reactive to light and accommodation. No scleral icterus. Extraocular muscles intact.  HEENT: Head atraumatic, normocephalic. Oropharynx and nasopharynx clear.  NECK:  Supple, no jugular venous distention. No thyroid enlargement, no tenderness.  LUNGS: Normal breath sounds bilaterally, no wheezing, rales, rhonchi. No use of accessory muscles of respiration.  CARDIOVASCULAR: S1, S2 normal. No murmurs, rubs, or gallops.  ABDOMEN: Soft, nontender, nondistended. Bowel sounds present. No organomegaly or mass.  EXTREMITIES: No cyanosis, clubbing or edema b/l.    NEUROLOGIC: Cranial nerves II through XII are intact. No focal Motor or sensory deficits b/l.  Globally weak.  PSYCHIATRIC: The patient is alert and oriented x 3.  SKIN: No obvious rash, lesion, or ulcer.   LABORATORY PANEL:   CBC  Recent Labs Lab 12/05/15 0618  WBC 9.4  HGB 9.9*  HCT 29.0*  PLT 239   ------------------------------------------------------------------------------------------------------------------ Chemistries   Recent Labs Lab 12/04/15 0600 12/05/15 0618 12/06/15 0435  NA 139 137  --   K 3.5 3.3* 3.2*  CL 107 105  --   CO2 24 23  --   GLUCOSE 150* 205*  --   BUN 10 8  --   CREATININE 0.56 0.63  --   CALCIUM 8.2* 8.4*  --   AST 20  --   --   ALT 19  --   --   ALKPHOS 160*  --   --  BILITOT 0.2*  --   --    ------------------------------------------------------------------------------------------------------------------  Cardiac Enzymes  Recent Labs Lab 12/02/15 2126  TROPONINI <0.03   ------------------------------------------------------------------------------------------------------------------  RADIOLOGY:  No results  found.   ASSESSMENT AND PLAN:   * Septic Shock due to UTI and right sided pneumonia   - Urine cultures + for E.coli and it's pansensitive.  - d/c Aztreonam,  Cont. Levaquin and switch to Oral upon discharge.   * Right-sided pneumonia with malignant lung cancer - cont. Levaquin  - Afebrile now and off vasopressors - Continue oxygen and wean as tolerated  * UTI - urine cultures + for E. Coli.  - cont. Levaquin. D/c Aztreonam.   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 - appreciate Oncology input as per them Reviewed the imaging results with the patient-liver showing multiple lesions suspicious for progressive malignancy. Patient is PDL-1 positive [70%]- should expect good response from immunotherapy. Patient has not started therapy yet. Plan to start treatment after radiation is complete.   * Diabetes mellitus -  Cont. SSI - BS stable.   * hx of Seizures - cont. Keppra.   * Hyperlipidemia - cont. Crestor  * Right shoulder pain - PRN Toradol, Fentanyl patch.  - likely related to mets and will monitor. Much improved.  * Hypokalemia - will place on Oral supplements and repeat level in a.m.  - check Mg. Level.   D/c Central line.    Physical therapy evaluation recommended short-term rehabilitation. Patient prefers to go home. Await PT to reeval pt.   All the records are reviewed and case discussed with Care Management/Social Workerr. Management plans discussed with the patient, family and they are in agreement.  CODE STATUS: FULL CODE  DVT Prophylaxis: SCDs  TOTAL CC TIME TAKING CARE OF THIS PATIENT: 30 minutes.   POSSIBLE D/C IN 1-2 DAYS, DEPENDING ON CLINICAL CONDITION.  Henreitta Leber M.D on 12/06/2015 at 12:50 PM  Between 7am to 6pm - Pager - [phone]  After 6pm go to [email or web address] - password [email or web address] [facility]  Office  [phone]  CC: Primary care physician;  Cletis Athens, MD  Note: This dictation was prepared with Dragon dictation along with smaller phrase technology. Any transcriptional errors that result from this process are unintentional.

## 2015-12-06 NOTE — Progress Notes (Signed)
Physical Therapy Treatment Patient Details Name: Marie [name] MRN: [id number] DOB: [birth year] Today's Date: 12/06/2015    History of Present Illness Pt is a 60 y/o female admitted for severe sepsis. Pt was brought to the ED by EMS with hypotension, hypoxia, fever, and altered mental status with complaints of not feeling well over the past several days. Per pt chart, pt was found to have septic shock with hypotension secondary to a UTI. PMH significant for CHF, COPD, history of lung cancer, and active brain cancer undergoing radiation.    PT Comments    Pt with BP of 90s/60s when tested multiple times t/o PT session.  She did have some lightheadedness on initially standing up and after her initial bout of ambulation and this remains her biggest concern for safety at home.  Otherwise her mobility, strength, balance and general ability to ambulate short/in-home distances dictate that she could reasonably go home as she desires.  She reports her boyfriend will be able to assist.  Follow Up Recommendations  Home health PT (Pt able to show enough safety that home is reasonable)     Equipment Recommendations       Recommendations for Other Services       Precautions / Restrictions Precautions Precautions: Fall Restrictions Weight Bearing Restrictions: No    Mobility  Bed Mobility Overal bed mobility: Independent             General bed mobility comments: Pt able to get up to sitting at EOB w/o assist and was able to don b/l socks independently  Transfers Overall transfer level: Independent Equipment used: Rolling walker (2 wheeled) Transfers: Sit to/from Stand Sit to Stand: Supervision         General transfer comment: Pt able to get to standing on 3 seperate attempts w/o direct assist.  She did have some dizziness and needed to stand for a few moments on each attempt to recover.  Ambulation/Gait Ambulation/Gait assistance: Modified independent (Device/Increase  time) Ambulation Distance (Feet): 40 Feet Assistive device: Rolling walker (2 wheeled)       General Gait Details: Pt walks ~25 ft and feels faint needing to sit.  After some recovery time she was able to get up and walk ~40 ft with increased speed and confidence.  Pt remains slow and guarded with ambualtion but did not have any safety concerns (other than fatigue and occasional lightheadedness).  Pt reports she is near her baseline.   Stairs            Wheelchair Mobility    Modified Rankin (Stroke Patients Only)       Balance Overall balance assessment: Modified Independent   Sitting balance-Leahy Scale: Good       Standing balance-Leahy Scale: Good                      Cognition Arousal/Alertness: Awake/alert Behavior During Therapy: WFL for tasks assessed/performed Overall Cognitive Status: Within Functional Limits for tasks assessed                      Exercises      General Comments        Pertinent Vitals/Pain Pain Assessment:  (general chronic pain)    Home Living                      Prior Function            PT Goals (current goals can now  be found in the care plan section) Progress towards PT goals: Progressing toward goals    Frequency  Min 2X/week    PT Plan Discharge plan needs to be updated    Co-evaluation             End of Session Equipment Utilized During Treatment: Gait belt;Oxygen Activity Tolerance: Patient limited by fatigue;Patient tolerated treatment well Patient left: with call bell/phone within reach;with chair alarm set     Time: 1511-1539 PT Time Calculation (min) (ACUTE ONLY): 28 min  Charges:  $Gait Training: 23-37 mins                    G Codes:      Kreg Shropshire, DPT 12/06/2015, 4:06 PM

## 2015-12-07 ENCOUNTER — Ambulatory Visit: Payer: Medicare Other

## 2015-12-07 ENCOUNTER — Ambulatory Visit: Payer: Medicare Other | Admitting: Internal Medicine

## 2015-12-07 ENCOUNTER — Other Ambulatory Visit: Payer: Medicare Other

## 2015-12-07 DIAGNOSIS — N179 Acute kidney failure, unspecified: Secondary | ICD-10-CM | POA: Diagnosis not present

## 2015-12-07 DIAGNOSIS — A419 Sepsis, unspecified organism: Secondary | ICD-10-CM | POA: Diagnosis not present

## 2015-12-07 DIAGNOSIS — C3431 Malignant neoplasm of lower lobe, right bronchus or lung: Secondary | ICD-10-CM | POA: Diagnosis not present

## 2015-12-07 DIAGNOSIS — J441 Chronic obstructive pulmonary disease with (acute) exacerbation: Secondary | ICD-10-CM | POA: Diagnosis not present

## 2015-12-07 DIAGNOSIS — N390 Urinary tract infection, site not specified: Secondary | ICD-10-CM | POA: Diagnosis not present

## 2015-12-07 LAB — CULTURE, BLOOD (ROUTINE X 2)
CULTURE: NO GROWTH
CULTURE: NO GROWTH

## 2015-12-07 LAB — GLUCOSE, CAPILLARY
Glucose-Capillary: 209 mg/dL — ABNORMAL HIGH (ref 65–99)
Glucose-Capillary: 179 mg/dL — ABNORMAL HIGH (ref 65–99)

## 2015-12-07 LAB — MAGNESIUM: MAGNESIUM: 1.7 mg/dL (ref 1.7–2.4)

## 2015-12-07 LAB — POTASSIUM: Potassium: 3.9 mmol/L (ref 3.5–5.1)

## 2015-12-07 MED ORDER — SULFAMETHOXAZOLE-TRIMETHOPRIM 800-160 MG PO TABS: 1.0000 | ORAL_TABLET | Freq: Two times a day (BID) | ORAL | 0 refills | Status: DC

## 2015-12-07 MED ORDER — SODIUM CHLORIDE 0.9 % IV BOLUS (SEPSIS)
500.0000 mL | Freq: Once | INTRAVENOUS | Status: AC
  Administered 2015-12-07: 500 mL via INTRAVENOUS

## 2015-12-07 MED ORDER — INSULIN GLARGINE 300 UNIT/ML ~~LOC~~ SOPN: 50.0000 [IU] | PEN_INJECTOR | Freq: Every day | SUBCUTANEOUS | Status: DC

## 2015-12-07 NOTE — Progress Notes (Signed)
Lab not made aware that pt no longer has central line.  Made them aware that line was removed yesterday and that lab needs to collect all lab draws from now on.  Clarise Cruz, RN

## 2015-12-07 NOTE — Progress Notes (Signed)
Discussed discharge instructions with pt. IV removed. All questions addressed. Pt transported home via car by her fiance.

## 2015-12-07 NOTE — Progress Notes (Signed)
MD notified, nursing interventions taking

## 2015-12-07 NOTE — Discharge Summary (Signed)
[facility] [place] NAME: Marie [name]    MR#:  [id number]  DATE OF BIRTH:  01-07-1956  DATE OF ADMISSION:  12/02/2015 ADMITTING PHYSICIAN: Lance Coon, MD  DATE OF DISCHARGE: 12/07/2015  PRIMARY CARE PHYSICIAN: MASOUD,JAVED, MD    ADMISSION DIAGNOSIS:  UTI (lower urinary tract infection) [N39.0] Acute respiratory failure with hypoxia (HCC) [J96.01] Severe sepsis ([place]) [A41.9, R65.20]  DISCHARGE DIAGNOSIS:  Principal Problem:   Severe sepsis ([place]) Active Problems:   Malignant neoplasm of lower lobe of right lung (HCC)   Poorly controlled diabetes mellitus ([place])   Acute encephalopathy   AKI (acute kidney injury) ([place])   UTI (lower urinary tract infection)   SECONDARY DIAGNOSIS:   Past Medical History:  Diagnosis Date  . Arthritis   . Cancer ([place])    Lung CA right wedge removed  . CHF (congestive heart failure) ([place])   . COPD (chronic obstructive pulmonary disease) (HCC)    has oxygen but doesn't use  . Diabetes mellitus type 2 with neurological manifestations ([place])    Peripheral neuropathy and foot ulcers  . Hypertension   . PAD (peripheral artery disease) ([place])   . Peripheral neuropathy ([place])   . Shortness of breath dyspnea     HOSPITAL COURSE:   60 year old female with past medical history of lung cancer, CHF, COPD, chronic respiratory failure, diabetes, peripheral neuropathy who presented to the hospital due to sepsis secondary to UTI and pneumonia.  * Septic Shock due to UTI and right sided pneumonia   -Initially patient was started on broad-spectrum IV antibiotics with Aztreonam and Levaquin. She was also maintained on some low-dose vasopressors while in the ICU. She has clinically improved and off pressors now and afebrile and hemodynamically stable. -Her urine cultures grew out Escherichia coli which was pansensitive. She was treated with IV Levaquin and the Aztreonam was stopped. Now being discharged on Oral  Bactrim for a few days.   * Right-sided pneumonia with malignant lung cancer - pt. Was treated with IV Levaquin, Aztreonam in the hospital.  - finished abx. Therapy. Clinically doing well.   * UTI - urine cultures + for E. Coli.  - was treated with IV Levaquin and now being discharged on Oral Bactrim.  - clinically Hemodynamically stable and afebrile.   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 Was seen by Oncology and as per them imaging results reviewed with the patient-liver showing multiple lesions suspicious for progressive malignancy. Patient is PDL-1positive [70%]- should expect good response from immunotherapy. Patient has not started therapy yet. Plan to start treatment after radiation is complete.   * Diabetes mellitus - : The hospital patient was maintained on sliding scale insulin. -She will be discharged on Lantus, metformin.  * hx of Seizures -she will cont. Keppra. No acute seizures while in the hospital.  * Hyperlipidemia - she will resume her Crestor, Gemfibrozil.    * Right shoulder pain - PRN Toradol, Fentanyl patch.  - much improved and resolved now.    * Hypokalemia - improved w/ supplementation and now resolved.    Pt. Is being discharged home with home health PT, RN.   DISCHARGE CONDITIONS:   Stable.   CONSULTS OBTAINED:  Treatment Team:  Cammie Sickle, MD Laverle Hobby, MD  DRUG ALLERGIES:   Allergies  Allergen Reactions  . Ampicillin Anaphylaxis  . Penicillins Anaphylaxis and Shortness Of Breath    Has patient had a PCN reaction  causing immediate rash, facial/tongue/throat swelling, SOB or lightheadedness with hypotension: yes Has patient had a PCN reaction causing severe rash involving mucus membranes or skin necrosis: no Has patient had a PCN reaction that required hospitalization no Has patient had a PCN reaction occurring within the last 10 years: no If all of the above answers are "NO", then may  proceed with Cephalosporin use.   . Avelox [Moxifloxacin Hcl In Nacl] Other (See Comments)    Reaction: MUSCLE CRAMPS  . Etodolac Rash  . Neurontin [Gabapentin] Anxiety    DISCHARGE MEDICATIONS:     Medication List    STOP taking these medications   clindamycin 300 MG capsule Commonly known as:  CLEOCIN   furosemide 40 MG tablet Commonly known as:  LASIX   propranolol 40 MG tablet Commonly known as:  INDERAL     TAKE these medications   ALPRAZolam 0.25 MG tablet Commonly known as:  XANAX Take 1 tablet (0.25 mg total) by mouth 2 (two) times daily.   budesonide-formoterol 160-4.5 MCG/ACT inhaler Commonly known as:  SYMBICORT Inhale 2 puffs into the lungs 2 (two) times daily. Reported on 05/20/2015   cyclobenzaprine 10 MG tablet Commonly known as:  FLEXERIL Take 10 mg by mouth 3 (three) times daily.   dexamethasone 4 MG tablet Commonly known as:  DECADRON Take 1 tablet (4 mg total) by mouth daily.   diazepam 5 MG tablet Commonly known as:  VALIUM Take 2 mg by mouth daily.   diphenhydrAMINE 25 MG tablet Commonly known as:  BENADRYL Take 25 mg by mouth at bedtime.   DULoxetine 60 MG capsule Commonly known as:  CYMBALTA Take 60 mg by mouth daily.   gemfibrozil 600 MG tablet Commonly known as:  LOPID Take 600 mg by mouth 2 (two) times daily before a meal.   glipiZIDE 5 MG tablet Commonly known as:  GLUCOTROL Take 5 mg by mouth 2 (two) times daily before a meal.   ibuprofen 100 MG tablet Commonly known as:  ADVIL,MOTRIN Take 100 mg by mouth every 6 (six) hours as needed for fever.   Insulin Glargine 300 UNIT/ML Sopn Commonly known as:  TOUJEO SOLOSTAR Inject 50 Units into the skin daily. What changed:  how much to take   levETIRAcetam 500 MG tablet Commonly known as:  KEPPRA Take 1 tablet by mouth 2 (two) times daily.   LYRICA 150 MG capsule Generic drug:  pregabalin Take 150 mg by mouth 2 (two) times daily.   metFORMIN 1000 MG tablet Commonly  known as:  GLUCOPHAGE Take 500 mg by mouth 2 (two) times daily with a meal.   oxyCODONE 5 MG immediate release tablet Commonly known as:  Oxy IR/ROXICODONE 1-2 tabs every 4 hours as needed for severe pain   pantoprazole 40 MG tablet Commonly known as:  PROTONIX Take 40 mg by mouth daily.   promethazine 12.5 MG tablet Commonly known as:  PHENERGAN Take 1 tablet (12.5 mg total) by mouth every 6 (six) hours as needed for nausea or vomiting.   rosuvastatin 10 MG tablet Commonly known as:  CRESTOR Take 10 mg by mouth daily.   senna-docusate 8.6-50 MG tablet Commonly known as:  Senokot-S Take 1 tablet by mouth daily as needed.   sulfamethoxazole-trimethoprim 800-160 MG tablet Commonly known as:  BACTRIM DS,SEPTRA DS Take 1 tablet by mouth 2 (two) times daily.         DISCHARGE INSTRUCTIONS:   DIET:  Cardiac diet and Diabetic diet  DISCHARGE CONDITION:  Stable  ACTIVITY:  Activity as tolerated  OXYGEN:  Home Oxygen: Yes.     Oxygen Delivery: 2 liters/min via Patient connected to nasal cannula oxygen  DISCHARGE LOCATION:  Home with Home Health PT, RN.    If you experience worsening of your admission symptoms, develop shortness of breath, life threatening emergency, suicidal or homicidal thoughts you must seek medical attention immediately by calling 911 or calling your MD immediately  if symptoms less severe.  You Must read complete instructions/literature along with all the possible adverse reactions/side effects for all the Medicines you take and that have been prescribed to you. Take any new Medicines after you have completely understood and accpet all the possible adverse reactions/side effects.   Please note  You were cared for by a hospitalist during your hospital stay. If you have any questions about your discharge medications or the care you received while you were in the hospital after you are discharged, you can call the unit and asked to speak with the  hospitalist on call if the hospitalist that took care of you is not available. Once you are discharged, your primary care physician will handle any further medical issues. Please note that NO REFILLS for any discharge medications will be authorized once you are discharged, as it is imperative that you return to your primary care physician (or establish a relationship with a primary care physician if you do not have one) for your aftercare needs so that they can reassess your need for medications and monitor your lab values.     Today   Feels better today. Re-evaluated by PT and they recommend Home health services.  No chest pain, shortness of breath, N/V.  Wants to go home.   VITAL SIGNS:  Blood pressure (!) 95/57, pulse (!) 108, temperature 97.9 F (36.6 C), temperature source Axillary, resp. rate 18, height _0  (1.651 m), weight 89.8 kg (198 lb), SpO2 97 %.  I/O:   Intake/Output Summary (Last 24 hours) at 12/07/15 1348 Last data filed at 12/06/15 2137  Gross per 24 hour  Intake                3 ml  Output                0 ml  Net                3 ml    PHYSICAL EXAMINATION:   GENERAL:  60 y.o.-year-old patient lying in the bed in no acute distress.  EYES: Pupils equal, round, reactive to light and accommodation. No scleral icterus. Extraocular muscles intact.  HEENT: Head atraumatic, normocephalic. Oropharynx and nasopharynx clear.  NECK:  Supple, no jugular venous distention. No thyroid enlargement, no tenderness.  LUNGS: Normal breath sounds bilaterally, no wheezing, rales, rhonchi. No use of accessory muscles of respiration.  CARDIOVASCULAR: S1, S2 normal. No murmurs, rubs, or gallops.  ABDOMEN: Soft, nontender, nondistended. Bowel sounds present. No organomegaly or mass.  EXTREMITIES: No cyanosis, clubbing or edema b/l.    NEUROLOGIC: Cranial nerves II through XII are intact. No focal Motor or sensory deficits b/l.  Globally weak.  PSYCHIATRIC: The patient is alert and  oriented x 3.  SKIN: No obvious rash, lesion, or ulcer.   DATA REVIEW:   CBC  Recent Labs Lab 12/05/15 0618  WBC 9.4  HGB 9.9*  HCT 29.0*  PLT 239    Chemistries   Recent Labs Lab 12/04/15 0600 12/05/15 0618  12/07/15 0736  NA 139 137  --   --  K 3.5 3.3*  < > 3.9  CL 107 105  --   --   CO2 24 23  --   --   GLUCOSE 150* 205*  --   --   BUN 10 8  --   --   CREATININE 0.56 0.63  --   --   CALCIUM 8.2* 8.4*  --   --   MG  --   --   --  1.7  AST 20  --   --   --   ALT 19  --   --   --   ALKPHOS 160*  --   --   --   BILITOT 0.2*  --   --   --   < > = values in this interval not displayed.  Cardiac Enzymes  Recent Labs Lab 12/02/15 2126  TROPONINI <0.03    Microbiology Results  Results for orders placed or performed during the hospital encounter of 12/02/15  Urine culture     Status: Abnormal   Collection Time: 12/02/15  9:26 PM  Result Value Ref Range Status   Specimen Description URINE, RANDOM  Final   Special Requests NONE  Final   Culture 80,000 COLONIES/mL ESCHERICHIA COLI (A)  Final   Report Status 12/05/2015 FINAL  Final   Organism ID, Bacteria ESCHERICHIA COLI (A)  Final      Susceptibility   Escherichia coli - MIC*    AMPICILLIN <=2 SENSITIVE Sensitive     CEFAZOLIN <=4 SENSITIVE Sensitive     CEFTRIAXONE <=1 SENSITIVE Sensitive     CIPROFLOXACIN <=0.25 SENSITIVE Sensitive     GENTAMICIN <=1 SENSITIVE Sensitive     IMIPENEM <=0.25 SENSITIVE Sensitive     NITROFURANTOIN 32 SENSITIVE Sensitive     TRIMETH/SULFA <=20 SENSITIVE Sensitive     AMPICILLIN/SULBACTAM <=2 SENSITIVE Sensitive     PIP/TAZO <=4 SENSITIVE Sensitive     Extended ESBL NEGATIVE Sensitive     * 80,000 COLONIES/mL ESCHERICHIA COLI  Blood Culture (routine x 2)     Status: None   Collection Time: 12/02/15 10:03 PM  Result Value Ref Range Status   Specimen Description BLOOD RIGHT FATTY CASTS  Final   Special Requests BOTTLES DRAWN AEROBIC AND ANAEROBIC [place]  Final   Culture NO  GROWTH 5 DAYS  Final   Report Status 12/07/2015 FINAL  Final  Blood Culture (routine x 2)     Status: None   Collection Time: 12/02/15 10:04 PM  Result Value Ref Range Status   Specimen Description BLOOD LEFT FATTY CASTS  Final   Special Requests BOTTLES DRAWN AEROBIC AND ANAEROBIC 6CC  Final   Culture NO GROWTH 5 DAYS  Final   Report Status 12/07/2015 FINAL  Final  MRSA PCR Screening     Status: None   Collection Time: 12/03/15  9:16 AM  Result Value Ref Range Status   MRSA by PCR NEGATIVE NEGATIVE Final    Comment:        The GeneXpert MRSA Assay (FDA approved for NASAL specimens only), is one component of a comprehensive MRSA colonization surveillance program. It is not intended to diagnose MRSA infection nor to guide or monitor treatment for MRSA infections.     RADIOLOGY:  No results found.    Management plans discussed with the patient, family and they are in agreement.  CODE STATUS:     Code Status Orders        Start     Ordered   12/03/15  1460  Full code  Continuous     12/03/15 0916    Code Status History    Date Active Date Inactive Code Status Order ID Comments User Context   12/03/2015  9:16 AM 12/03/2015 12:45 PM Full Code [id number]  Lance Coon, MD Inpatient   11/01/2014  1:23 AM 11/02/2014  5:08 PM Full Code [id number]  Harrie Foreman, MD Inpatient   08/26/2014 12:53 PM 09/02/2014  6:53 PM Full Code [id number]  Nestor Lewandowsky, MD Inpatient      TOTAL TIME TAKING CARE OF THIS PATIENT: 40 minutes.    Henreitta Leber M.D on 12/07/2015 at 1:48 PM  Between 7am to 6pm - Pager - [phone]  After 6pm go to [email or web address] - Proofreader  [facility]  Office  [phone]  CC: Primary care physician; Cletis Athens, MD

## 2015-12-07 NOTE — Progress Notes (Signed)
SATURATION QUALIFICATIONS: (This note is used to comply with regulatory documentation for home oxygen)  Patient Saturations on Room Air at Rest = 85%  Patient Saturations on Room Air while Ambulating = 83%  Patient Saturations on 2 Liters of oxygen while Ambulating = 97%  Please briefly explain why patient needs home oxygen:chronic COPD

## 2015-12-07 NOTE — Care Management Important Message (Signed)
Important Message  Patient Details  Name: Marie [name] MRN: [id number] Date of Birth: Oct 18, 1955   Medicare Important Message Given:  Yes    Shelbie Ammons, RN 12/07/2015, 9:29 AM

## 2015-12-07 NOTE — Progress Notes (Signed)
Patient BP Low. No acute distress noted. Asympathic. Dr. Marcille Blanco notified placing new orders.

## 2015-12-07 NOTE — Care Management (Signed)
Discharge to home today per Dr. Tor Netters. Marie [name] states that she already gets her home health and therapy services through [facility]. Couldn't remember what agency. States she will call them when she gets home and let them resume services. [facility] will transport.  Shelbie Ammons RN MSN CCM Care Management [phone]

## 2015-12-08 ENCOUNTER — Inpatient Hospital Stay
Admission: EM | Admit: 2015-12-08 | Discharge: 2015-12-12 | DRG: 064 | Disposition: A | Discharge status: Home / Self Care | Payer: Medicare Other | Attending: Internal Medicine | Admitting: Internal Medicine

## 2015-12-08 ENCOUNTER — Ambulatory Visit: Payer: Medicare Other

## 2015-12-08 ENCOUNTER — Inpatient Hospital Stay: Payer: Medicare Other

## 2015-12-08 ENCOUNTER — Emergency Department: Payer: Medicare Other

## 2015-12-08 ENCOUNTER — Encounter: Payer: Self-pay | Admitting: *Deleted

## 2015-12-08 DIAGNOSIS — E1149 Type 2 diabetes mellitus with other diabetic neurological complication: Secondary | ICD-10-CM | POA: Diagnosis present

## 2015-12-08 DIAGNOSIS — M1990 Unspecified osteoarthritis, unspecified site: Secondary | ICD-10-CM | POA: Diagnosis not present

## 2015-12-08 DIAGNOSIS — C787 Secondary malignant neoplasm of liver and intrahepatic bile duct: Secondary | ICD-10-CM | POA: Diagnosis not present

## 2015-12-08 DIAGNOSIS — F17210 Nicotine dependence, cigarettes, uncomplicated: Secondary | ICD-10-CM | POA: Diagnosis present

## 2015-12-08 DIAGNOSIS — Z880 Allergy status to penicillin: Secondary | ICD-10-CM

## 2015-12-08 DIAGNOSIS — Z833 Family history of diabetes mellitus: Secondary | ICD-10-CM | POA: Diagnosis not present

## 2015-12-08 DIAGNOSIS — R112 Nausea with vomiting, unspecified: Secondary | ICD-10-CM | POA: Diagnosis present

## 2015-12-08 DIAGNOSIS — Z8249 Family history of ischemic heart disease and other diseases of the circulatory system: Secondary | ICD-10-CM

## 2015-12-08 DIAGNOSIS — C7931 Secondary malignant neoplasm of brain: Secondary | ICD-10-CM | POA: Diagnosis not present

## 2015-12-08 DIAGNOSIS — M1612 Unilateral primary osteoarthritis, left hip: Secondary | ICD-10-CM | POA: Diagnosis not present

## 2015-12-08 DIAGNOSIS — Z79899 Other long term (current) drug therapy: Secondary | ICD-10-CM | POA: Diagnosis not present

## 2015-12-08 DIAGNOSIS — E1165 Type 2 diabetes mellitus with hyperglycemia: Secondary | ICD-10-CM | POA: Diagnosis not present

## 2015-12-08 DIAGNOSIS — I509 Heart failure, unspecified: Secondary | ICD-10-CM | POA: Diagnosis not present

## 2015-12-08 DIAGNOSIS — I110 Hypertensive heart disease with heart failure: Secondary | ICD-10-CM | POA: Diagnosis present

## 2015-12-08 DIAGNOSIS — M25559 Pain in unspecified hip: Secondary | ICD-10-CM

## 2015-12-08 DIAGNOSIS — C3411 Malignant neoplasm of upper lobe, right bronchus or lung: Secondary | ICD-10-CM | POA: Diagnosis not present

## 2015-12-08 DIAGNOSIS — G9340 Encephalopathy, unspecified: Secondary | ICD-10-CM | POA: Diagnosis not present

## 2015-12-08 DIAGNOSIS — Z9861 Coronary angioplasty status: Secondary | ICD-10-CM

## 2015-12-08 DIAGNOSIS — I639 Cerebral infarction, unspecified: Secondary | ICD-10-CM | POA: Diagnosis not present

## 2015-12-08 DIAGNOSIS — M25551 Pain in right hip: Secondary | ICD-10-CM

## 2015-12-08 DIAGNOSIS — Z794 Long term (current) use of insulin: Secondary | ICD-10-CM | POA: Diagnosis not present

## 2015-12-08 DIAGNOSIS — J449 Chronic obstructive pulmonary disease, unspecified: Secondary | ICD-10-CM | POA: Diagnosis not present

## 2015-12-08 DIAGNOSIS — E1142 Type 2 diabetes mellitus with diabetic polyneuropathy: Secondary | ICD-10-CM | POA: Diagnosis not present

## 2015-12-08 DIAGNOSIS — C3431 Malignant neoplasm of lower lobe, right bronchus or lung: Secondary | ICD-10-CM | POA: Diagnosis present

## 2015-12-08 DIAGNOSIS — R6889 Other general symptoms and signs: Secondary | ICD-10-CM | POA: Diagnosis not present

## 2015-12-08 DIAGNOSIS — Z7984 Long term (current) use of oral hypoglycemic drugs: Secondary | ICD-10-CM

## 2015-12-08 DIAGNOSIS — Z90710 Acquired absence of both cervix and uterus: Secondary | ICD-10-CM

## 2015-12-08 DIAGNOSIS — Z888 Allergy status to other drugs, medicaments and biological substances status: Secondary | ICD-10-CM

## 2015-12-08 DIAGNOSIS — R4182 Altered mental status, unspecified: Secondary | ICD-10-CM | POA: Diagnosis not present

## 2015-12-08 DIAGNOSIS — G893 Neoplasm related pain (acute) (chronic): Secondary | ICD-10-CM

## 2015-12-08 DIAGNOSIS — I739 Peripheral vascular disease, unspecified: Secondary | ICD-10-CM | POA: Diagnosis not present

## 2015-12-08 DIAGNOSIS — R531 Weakness: Secondary | ICD-10-CM | POA: Diagnosis not present

## 2015-12-08 DIAGNOSIS — I63233 Cerebral infarction due to unspecified occlusion or stenosis of bilateral carotid arteries: Secondary | ICD-10-CM | POA: Diagnosis not present

## 2015-12-08 DIAGNOSIS — C719 Malignant neoplasm of brain, unspecified: Secondary | ICD-10-CM | POA: Diagnosis not present

## 2015-12-08 DIAGNOSIS — K5900 Constipation, unspecified: Secondary | ICD-10-CM | POA: Diagnosis present

## 2015-12-08 DIAGNOSIS — Z9889 Other specified postprocedural states: Secondary | ICD-10-CM

## 2015-12-08 DIAGNOSIS — C3490 Malignant neoplasm of unspecified part of unspecified bronchus or lung: Secondary | ICD-10-CM | POA: Diagnosis not present

## 2015-12-08 DIAGNOSIS — Z515 Encounter for palliative care: Secondary | ICD-10-CM

## 2015-12-08 DIAGNOSIS — Z66 Do not resuscitate: Secondary | ICD-10-CM | POA: Diagnosis not present

## 2015-12-08 LAB — COMPREHENSIVE METABOLIC PANEL
ALK PHOS: 201 U/L — AB (ref 38–126)
ALT: 23 U/L (ref 14–54)
ANION GAP: 11 (ref 5–15)
AST: 19 U/L (ref 15–41)
Albumin: 2.4 g/dL — ABNORMAL LOW (ref 3.5–5.0)
BUN: 10 mg/dL (ref 6–20)
CALCIUM: 9.1 mg/dL (ref 8.9–10.3)
CO2: 23 mmol/L (ref 22–32)
CREATININE: 0.63 mg/dL (ref 0.44–1.00)
Chloride: 105 mmol/L (ref 101–111)
Glucose, Bld: 199 mg/dL — ABNORMAL HIGH (ref 65–99)
Potassium: 3.6 mmol/L (ref 3.5–5.1)
Sodium: 139 mmol/L (ref 135–145)
Total Bilirubin: 0.8 mg/dL (ref 0.3–1.2)
Total Protein: 6.9 g/dL (ref 6.5–8.1)

## 2015-12-08 LAB — CBC WITH DIFFERENTIAL/PLATELET
Basophils Absolute: 0.1 10*3/uL (ref 0–0.1)
Basophils Relative: 1 %
Eosinophils Absolute: 0.8 10*3/uL — ABNORMAL HIGH (ref 0–0.7)
Eosinophils Relative: 6 %
HEMATOCRIT: 32.2 % — AB (ref 35.0–47.0)
HEMOGLOBIN: 10.7 g/dL — AB (ref 12.0–16.0)
LYMPHS ABS: 1.2 10*3/uL (ref 1.0–3.6)
Lymphocytes Relative: 9 %
MCH: 29.3 pg (ref 26.0–34.0)
MCHC: 33.3 g/dL (ref 32.0–36.0)
MCV: 87.9 fL (ref 80.0–100.0)
Monocytes Absolute: 0.8 10*3/uL (ref 0.2–0.9)
Monocytes Relative: 6 %
NEUTROS ABS: 10.6 10*3/uL — AB (ref 1.4–6.5)
NEUTROS PCT: 78 %
Platelets: 321 10*3/uL (ref 150–440)
RBC: 3.66 MIL/uL — ABNORMAL LOW (ref 3.80–5.20)
RDW: 14.4 % (ref 11.5–14.5)
WBC: 13.5 10*3/uL — AB (ref 3.6–11.0)

## 2015-12-08 LAB — MAGNESIUM: Magnesium: 1.6 mg/dL — ABNORMAL LOW (ref 1.7–2.4)

## 2015-12-08 MED ORDER — DEXAMETHASONE 4 MG PO TABS
8.0000 mg | ORAL_TABLET | Freq: Four times a day (QID) | ORAL | Status: DC
  Administered 2015-12-08 – 2015-12-11 (×9): 8 mg via ORAL
  Filled 2015-12-08 (×10): qty 2

## 2015-12-08 MED ORDER — HYDROMORPHONE HCL 1 MG/ML IJ SOLN
0.5000 mg | INTRAMUSCULAR | Status: DC | PRN
  Administered 2015-12-09 – 2015-12-12 (×13): 0.5 mg via INTRAVENOUS
  Filled 2015-12-08 (×13): qty 1

## 2015-12-08 MED ORDER — SODIUM CHLORIDE 0.9 % IV SOLN
Freq: Once | INTRAVENOUS | Status: AC
  Administered 2015-12-08: 20:00:00 via INTRAVENOUS

## 2015-12-08 MED ORDER — MORPHINE SULFATE (PF) 4 MG/ML IV SOLN
4.0000 mg | Freq: Once | INTRAVENOUS | Status: AC
  Administered 2015-12-08: 4 mg via INTRAVENOUS
  Filled 2015-12-08: qty 1

## 2015-12-08 MED ORDER — ONDANSETRON HCL 4 MG/2ML IJ SOLN
4.0000 mg | Freq: Once | INTRAMUSCULAR | Status: AC
  Administered 2015-12-08: 4 mg via INTRAVENOUS
  Filled 2015-12-08: qty 2

## 2015-12-08 NOTE — ED Provider Notes (Signed)
[facility] Emergency Department Provider Note   ____________________________________________   First MD Initiated Contact with Patient 12/08/15 1903     (approximate)  I have reviewed the triage vital signs and the nursing notes.   HISTORY  Chief Complaint Weakness    HPI Marie [name] is a 60 y.o. female page with recent admission for sepsis recent diagnosis of brain cancer complains of several days of a bad headache unchanging really today is nauseated and does not feel like eating or drinking. He says even thinking about trying it makes her nauseated worse. She is not having any fever chills weakness is not vomiting as long she is not eating anything belly is not tender   Past Medical History:  Diagnosis Date  . Arthritis   . Cancer ([place])    Lung CA right wedge removed  . CHF (congestive heart failure) ([place])   . COPD (chronic obstructive pulmonary disease) ([facility])    has oxygen but doesn't use  . Diabetes mellitus type 2 with neurological manifestations ([place])    Peripheral neuropathy and foot ulcers  . Hypertension   . PAD (peripheral artery disease) ([place])   . Peripheral neuropathy ([place])   . Shortness of breath dyspnea     Patient Active Problem List   Diagnosis Date Noted  . Acute encephalopathy 12/02/2015  . AKI (acute kidney injury) ([place]) 12/02/2015  . Severe sepsis ([place]) 12/02/2015  . UTI (lower urinary tract infection) 12/02/2015  . Poorly controlled diabetes mellitus ([place]) 11/18/2015  . Brain metastasis ([place]) 11/04/2015  . Osteomyelitis ([place]) 11/01/2014  . Malignant neoplasm of lower lobe of right lung ([place]) 10/02/2014    Past Surgical History:  Procedure Laterality Date  . ABDOMINAL HYSTERECTOMY    . BACK SURGERY  x 7  . CARPAL TUNNEL RELEASE Bilateral   . CESAREAN SECTION     x3  . CORONARY ANGIOPLASTY     no stents  . FOOT SURGERY Left    diabetic ulcer  . KNEE ARTHROSCOPY Bilateral   . TONSILLECTOMY    . VIDEO  ASSISTED THORACOSCOPY (VATS)/THOROCOTOMY Right 08/26/2014   Procedure: VIDEO ASSISTED THORACOSCOPY (VATS)/THOROCOTOMY;  Surgeon: Nestor Lewandowsky, MD;  Location: [facility] ORS;  Service: General;  Laterality: Right;    Prior to Admission medications   Medication Sig Start Date End Date Taking? Authorizing Provider  ALPRAZolam (XANAX) 0.25 MG tablet Take 1 tablet (0.25 mg total) by mouth 2 (two) times daily. 11/18/15   Cammie Sickle, MD  budesonide-formoterol (SYMBICORT) 160-4.5 MCG/ACT inhaler Inhale 2 puffs into the lungs 2 (two) times daily. Reported on 05/20/2015    Historical Provider, MD  cyclobenzaprine (FLEXERIL) 10 MG tablet Take 10 mg by mouth 3 (three) times daily.     Historical Provider, MD  dexamethasone (DECADRON) 4 MG tablet Take 1 tablet (4 mg total) by mouth daily. 11/12/15   Noreene Filbert, MD  diazepam (VALIUM) 5 MG tablet Take 2 mg by mouth daily.  08/15/14   Historical Provider, MD  diphenhydrAMINE (BENADRYL) 25 MG tablet Take 25 mg by mouth at bedtime.    Historical Provider, MD  DULoxetine (CYMBALTA) 60 MG capsule Take 60 mg by mouth daily.    Historical Provider, MD  gemfibrozil (LOPID) 600 MG tablet Take 600 mg by mouth 2 (two) times daily before a meal.    Historical Provider, MD  glipiZIDE (GLUCOTROL) 5 MG tablet Take 5 mg by mouth 2 (two) times daily before a meal.  06/26/14   Historical Provider,  MD  ibuprofen (ADVIL,MOTRIN) 100 MG tablet Take 100 mg by mouth every 6 (six) hours as needed for fever.    Historical Provider, MD  Insulin Glargine (TOUJEO SOLOSTAR) 300 UNIT/ML SOPN Inject 50 Units into the skin daily. 12/07/15   Henreitta Leber, MD  levETIRAcetam (KEPPRA) 500 MG tablet Take 1 tablet by mouth 2 (two) times daily. 12/01/15   Historical Provider, MD  LYRICA 150 MG capsule Take 150 mg by mouth 2 (two) times daily.  10/29/15   Historical Provider, MD  metFORMIN (GLUCOPHAGE) 1000 MG tablet Take 500 mg by mouth 2 (two) times daily with a meal.  05/19/15   Historical Provider, MD    oxyCODONE (OXY IR/ROXICODONE) 5 MG immediate release tablet 1-2 tabs every 4 hours as needed for severe pain 11/25/15   Cammie Sickle, MD  pantoprazole (PROTONIX) 40 MG tablet Take 40 mg by mouth daily.  10/28/15 10/27/16  Historical Provider, MD  promethazine (PHENERGAN) 12.5 MG tablet Take 1 tablet (12.5 mg total) by mouth every 6 (six) hours as needed for nausea or vomiting. 11/19/15   Cammie Sickle, MD  rosuvastatin (CRESTOR) 10 MG tablet Take 10 mg by mouth daily.    Historical Provider, MD  senna-docusate (SENOKOT-S) 8.6-50 MG tablet Take 1 tablet by mouth daily as needed.  10/28/15 10/27/16  Historical Provider, MD  sulfamethoxazole-trimethoprim (BACTRIM DS,SEPTRA DS) 800-160 MG tablet Take 1 tablet by mouth 2 (two) times daily. 12/07/15   Henreitta Leber, MD    Allergies Ampicillin; Penicillins; Avelox [moxifloxacin hcl in nacl]; Etodolac; and Neurontin [gabapentin]  Family History  Problem Relation Age of Onset  . Hypertension Brother   . Coronary artery disease Mother   . Diabetes Mellitus II Mother   . Coronary artery disease Father   . Diabetes Mellitus II Father   . Diabetes Mellitus II Brother     Social History Social History  Substance Use Topics  . Smoking status: Current Every Day Smoker    Packs/day: 0.50    Years: 46.00    Types: Cigarettes  . Smokeless tobacco: Never Used  . Alcohol use No    Review of Systems Constitutional: No fever/chills Eyes: No visual changes. ENT: No sore throat. Cardiovascular: Denies chest pain. Respiratory: Denies shortness of breath. Gastrointestinal: No abdominal pain. nausea, no vomiting.  No diarrhea.  No constipation. Genitourinary: Negative for dysuria. Musculoskeletal: Negative for back pain. Skin: Negative for rash. Neurological: Negative for focal weakness or numbness.  10-point ROS otherwise negative.  ____________________________________________   PHYSICAL EXAM:  VITAL SIGNS: ED Triage Vitals  Enc  Vitals Group     BP 12/08/15 1904 (!) 151/88     Pulse Rate 12/08/15 1904 (!) 103     Resp 12/08/15 1904 16     Temp 12/08/15 1904 98.4 F (36.9 C)     Temp Source 12/08/15 1904 Oral     SpO2 12/08/15 1904 93 %     Weight 12/08/15 1905 198 lb (89.8 kg)     Height 12/08/15 1905 '5\' 5"'$  (1.651 m)     Head Circumference --      Peak Flow --      Pain Score --      Pain Loc --      Pain Edu? --      Excl. in [place]? --     Constitutional: Alert and oriented. Looks tired but in no acute distress. Eyes: Conjunctivae are normal. PERRL. EOMI. Head: Atraumatic. Nose: No congestion/rhinnorhea. Mouth/Throat: Mucous  membranes are moist.  Oropharynx non-erythematous. Neck: No stridor.  Cardiovascular: Normal rate, regular rhythm. Grossly normal heart sounds.  Good peripheral circulation. Respiratory: Normal respiratory effort.  No retractions. Lungs CTAB. Gastrointestinal: Soft and nontender. No distention. No abdominal bruits. No CVA tenderness. Musculoskeletal: No lower extremity tenderness nor edema.  No joint effusions. Neurologic:  Normal speech and language. No gross focal neurologic deficits are appreciated. No gait instability. Skin:  Skin is warm, dry and intact. No rash noted. Psychiatric: Mood and affect are normal. Speech and behavior are normal.  ____________________________________________   LABS (all labs ordered are listed, but only abnormal results are displayed)  Labs Reviewed  COMPREHENSIVE METABOLIC PANEL - Abnormal; Notable for the following:       Result Value   Glucose, Bld 199 (*)    Albumin 2.4 (*)    Alkaline Phosphatase 201 (*)    All other components within normal limits  CBC WITH DIFFERENTIAL/PLATELET - Abnormal; Notable for the following:    WBC 13.5 (*)    RBC 3.66 (*)    Hemoglobin 10.7 (*)    HCT 32.2 (*)    Neutro Abs 10.6 (*)    Eosinophils Absolute 0.8 (*)    All other components within normal limits  MAGNESIUM - Abnormal; Notable for the  following:    Magnesium 1.6 (*)    All other components within normal limits  URINALYSIS COMPLETEWITH MICROSCOPIC ([facility] ONLY)   ____________________________________________  EKG   ____________________________________________  RADIOLOGY  EXAM: CT HEAD WITHOUT CONTRAST  TECHNIQUE: Contiguous axial images were obtained from the base of the skull through the vertex without intravenous contrast.  COMPARISON:  None.  FINDINGS: Brain: High right parietal vasogenic edema is noted with a probable peripheral 10 mm hyper attenuating lesion (image 26 series 2). Overlying craniotomy defect is evident. 8 mm hypo attenuating lesion is identified in the right thalamus, not described previously.  Wedge-shaped area of hypoattenuation identified in the inferior right cerebellar hemisphere. On imaging today, this has an appearnace of encephalomalacia related infarct, but could certainly represent vasogenic edema from mass lesion. This finding was not described up on the CT report from [place] about 1 month ago, suggesting that it is a new interval finding.  No evidence for midline shift. No hydrocephalus. No CT findings of acute hemorrhage.  Vascular: Atherosclerotic calcification is visualized in the carotid arteries. No dense MCA sign. Major dural sinuses are unremarkable.  Skull: High right parietal craniotomy defect noted.  Sinuses/Orbits: The visualized paranasal sinuses and mastoid air cells are clear. Visualized portions of the globes and intraorbital fat are unremarkable.  Other: N/A  IMPRESSION: 1. Chronic right parietal vasogenic edema with probable 10 mm hyper attenuating lesion towards the vertex. Overlying craniotomy defect suggest previous surgery in this region and vasogenic edema was described in this area on previous outside CT report from [place] dated 10/26/2015. 2. 8 mm hypo attenuating lesion right down  was not described previously. Age indeterminate lacunar infarction could have this appearance. MRI without and with contrast may prove helpful to further evaluate. 3. Peripheral wedge-shaped area of apparent encephalomalacia in the inferior right cerebellar hemisphere from so not described previously. This could represent an area of post ischemic encephalomalacia but given the history of metastatic disease to the brain, vasogenic edema from metastatic deposit cannot be entirely excluded. This could also be better assessed by MRI. 4. No hydrocephalus or midline shift. No evidence for acute hemorrhage.   Electronically Signed  By: Misty Stanley M.D.   On: 12/08/2015 20:07  ____________________________________________   PROCEDURES    Procedures  Critical Care performed: Critical care time 45 minutes including discussing the patient with the oncologist hospitalist talking to the patient herself  ____________________________________________   INITIAL IMPRESSION / [place] / ED COURSE  Pertinent labs & imaging results that were available during my care of the patient were reviewed by me and considered in my medical decision making (see chart for details).  Discussed with Dr. Grayland Ormond. He recommends keeping her here since she gets her radiation therapy here Dr. Arturo Morton can see her in the morning increasing the Decadron to 8 mg or so 4 times a day.  Clinical Course     ____________________________________________   FINAL CLINICAL IMPRESSION(S) / ED DIAGNOSES  Final diagnoses:  Weakness   Actual diagnosis is apparent new brain mass cannot find this on the computer   NEW MEDICATIONS STARTED DURING THIS VISIT:  New Prescriptions   No medications on file     Note:  This document was prepared using Dragon voice recognition software and may include unintentional dictation errors.    Nena Polio, MD 12/08/15 [phone]

## 2015-12-08 NOTE — ED Triage Notes (Signed)
Pt arrived to ED via EMS from home where pt lives with boyfriend and son. Pt dx 6 weeks ago with brain cancer and was admitted to ICU on August 23rd for similar complaint of "not feeling well." Pt discharged from ICU yesterday and reports since yesterday she has had no PO intake and has not taken any daily medications. Pt denies NVD, abd pain or chest pain. Pts only pain is head pain but pt reports that is normal since brain cancer dx. Pt is scheduled for radiation tomorrow. No chemo treatment at this time. Pt disoriented to time but alert and oriented to self, situation and place.

## 2015-12-08 NOTE — ED Provider Notes (Signed)
ED ECG REPORT I, Loney Hering, the attending physician, personally viewed and interpreted this ECG.   Date: 12/08/2015  EKG Time: 2251  Rate: 103  Rhythm: sinus tachycardia  Axis: right axis deviation  Intervals:none  ST&T Change: none    Loney Hering, MD 12/08/15 2324

## 2015-12-08 NOTE — H&P (Signed)
[facility] NAME: Marie [name]    MR#:  [id number]  DATE OF BIRTH:  1956/01/07  DATE OF ADMISSION:  12/08/2015  PRIMARY CARE PHYSICIAN: Cletis [place], MD   REQUESTING/REFERRING PHYSICIAN: Cinda Quest, MD  CHIEF COMPLAINT:   Chief Complaint  Patient presents with  . Weakness    HISTORY OF PRESENT ILLNESS:  Marie [name]  is a 60 y.o. female who presents with Weakness, poor appetite, intractable nausea. Patient was recently discharged from our hospital after stay for sepsis, and when she went home was unable to eat or take her meds. On reevaluation the ED today she was found to have new brain metastases. Oncology was contacted from the ED and recommended admission, they will follow along with the patient with recommendation for further treatment. Hospitalists were called for admission  PAST MEDICAL HISTORY:   Past Medical History:  Diagnosis Date  . Arthritis   . Cancer ([place])    Lung CA right wedge removed  . CHF (congestive heart failure) ([place])   . COPD (chronic obstructive pulmonary disease) (HCC)    has oxygen but doesn't use  . Diabetes mellitus type 2 with neurological manifestations ([place])    Peripheral neuropathy and foot ulcers  . Hypertension   . PAD (peripheral artery disease) ([place])   . Peripheral neuropathy ([place])   . Shortness of breath dyspnea     PAST SURGICAL HISTORY:   Past Surgical History:  Procedure Laterality Date  . ABDOMINAL HYSTERECTOMY    . BACK SURGERY  x 7  . CARPAL TUNNEL RELEASE Bilateral   . CESAREAN SECTION     x3  . CORONARY ANGIOPLASTY     no stents  . FOOT SURGERY Left    diabetic ulcer  . KNEE ARTHROSCOPY Bilateral   . TONSILLECTOMY    . VIDEO ASSISTED THORACOSCOPY (VATS)/THOROCOTOMY Right 08/26/2014   Procedure: VIDEO ASSISTED THORACOSCOPY (VATS)/THOROCOTOMY;  Surgeon: Nestor Lewandowsky, MD;  Location: [facility] ORS;  Service: General;  Laterality: Right;    SOCIAL HISTORY:   Social  History  Substance Use Topics  . Smoking status: Current Every Day Smoker    Packs/day: 0.50    Years: 46.00    Types: Cigarettes  . Smokeless tobacco: Never Used  . Alcohol use No    FAMILY HISTORY:   Family History  Problem Relation Age of Onset  . Hypertension Brother   . Coronary artery disease Mother   . Diabetes Mellitus II Mother   . Coronary artery disease Father   . Diabetes Mellitus II Father   . Diabetes Mellitus II Brother     DRUG ALLERGIES:   Allergies  Allergen Reactions  . Ampicillin Anaphylaxis  . Penicillins Anaphylaxis and Shortness Of Breath    Has patient had a PCN reaction causing immediate rash, facial/tongue/throat swelling, SOB or lightheadedness with hypotension: yes Has patient had a PCN reaction causing severe rash involving mucus membranes or skin necrosis: no Has patient had a PCN reaction that required hospitalization no Has patient had a PCN reaction occurring within the last 10 years: no If all of the above answers are "NO", then may proceed with Cephalosporin use.   . Avelox [Moxifloxacin Hcl In Nacl] Other (See Comments)    Reaction: MUSCLE CRAMPS  . Etodolac Rash  . Neurontin [Gabapentin] Anxiety    MEDICATIONS AT HOME:   Prior to Admission medications   Medication Sig Start Date End Date Taking? Authorizing Provider  ALPRAZolam Duanne Moron) 0.25 MG tablet  Take 1 tablet (0.25 mg total) by mouth 2 (two) times daily. 11/18/15  Yes Cammie Sickle, MD  budesonide-formoterol (SYMBICORT) 160-4.5 MCG/ACT inhaler Inhale 2 puffs into the lungs 2 (two) times daily. Reported on 05/20/2015   Yes Historical Provider, MD  cyclobenzaprine (FLEXERIL) 10 MG tablet Take 10 mg by mouth 3 (three) times daily.    Yes Historical Provider, MD  dexamethasone (DECADRON) 4 MG tablet Take 1 tablet (4 mg total) by mouth daily. 11/12/15  Yes Noreene Filbert, MD  diazepam (VALIUM) 5 MG tablet Take 2 mg by mouth daily.  08/15/14  Yes Historical Provider, MD   diphenhydrAMINE (BENADRYL) 25 MG tablet Take 25 mg by mouth at bedtime.   Yes Historical Provider, MD  DULoxetine (CYMBALTA) 60 MG capsule Take 60 mg by mouth daily.   Yes Historical Provider, MD  gemfibrozil (LOPID) 600 MG tablet Take 600 mg by mouth 2 (two) times daily before a meal.   Yes Historical Provider, MD  glipiZIDE (GLUCOTROL) 5 MG tablet Take 5 mg by mouth 2 (two) times daily before a meal.  06/26/14  Yes Historical Provider, MD  ibuprofen (ADVIL,MOTRIN) 100 MG tablet Take 100 mg by mouth every 6 (six) hours as needed for fever.   Yes Historical Provider, MD  Insulin Glargine (TOUJEO SOLOSTAR) 300 UNIT/ML SOPN Inject 50 Units into the skin daily. 12/07/15  Yes Henreitta Leber, MD  levETIRAcetam (KEPPRA) 500 MG tablet Take 1 tablet by mouth 2 (two) times daily. 12/01/15  Yes Historical Provider, MD  LYRICA 150 MG capsule Take 150 mg by mouth 2 (two) times daily.  10/29/15  Yes Historical Provider, MD  metFORMIN (GLUCOPHAGE) 1000 MG tablet Take 500 mg by mouth 2 (two) times daily with a meal.  05/19/15  Yes Historical Provider, MD  oxyCODONE (OXY IR/ROXICODONE) 5 MG immediate release tablet 1-2 tabs every 4 hours as needed for severe pain 11/25/15  Yes Cammie Sickle, MD  pantoprazole (PROTONIX) 40 MG tablet Take 40 mg by mouth daily.  10/28/15 10/27/16 Yes Historical Provider, MD  promethazine (PHENERGAN) 12.5 MG tablet Take 1 tablet (12.5 mg total) by mouth every 6 (six) hours as needed for nausea or vomiting. 11/19/15  Yes Cammie Sickle, MD  rosuvastatin (CRESTOR) 10 MG tablet Take 10 mg by mouth daily.   Yes Historical Provider, MD  senna-docusate (SENOKOT-S) 8.6-50 MG tablet Take 1 tablet by mouth daily as needed for mild constipation.  10/28/15 10/27/16 Yes Historical Provider, MD  sulfamethoxazole-trimethoprim (BACTRIM DS,SEPTRA DS) 800-160 MG tablet Take 1 tablet by mouth 2 (two) times daily. 12/07/15  Yes Henreitta Leber, MD    REVIEW OF SYSTEMS:  Review of Systems   Constitutional: Positive for malaise/fatigue. Negative for chills, fever and weight loss.  HENT: Negative for ear pain, hearing loss and tinnitus.   Eyes: Negative for blurred vision, double vision, pain and redness.  Respiratory: Negative for cough, hemoptysis and shortness of breath.   Cardiovascular: Negative for chest pain, palpitations, orthopnea and leg swelling.  Gastrointestinal: Positive for nausea. Negative for abdominal pain, constipation, diarrhea and vomiting.  Genitourinary: Negative for dysuria, frequency and hematuria.  Musculoskeletal: Negative for back pain, joint pain and neck pain.  Skin:       No acne, rash, or lesions  Neurological: Positive for weakness. Negative for dizziness, tremors and focal weakness.  Endo/Heme/Allergies: Negative for polydipsia. Does not bruise/bleed easily.  Psychiatric/Behavioral: Negative for depression. The patient is not nervous/anxious and does not have insomnia.  VITAL SIGNS:   Vitals:   12/08/15 1904 12/08/15 1905 12/08/15 2300  BP: (!) 151/88  123/75  Pulse: (!) 103  (!) 101  Resp: 16  (!) 22  Temp: 98.4 F (36.9 C)    TempSrc: Oral    SpO2: 93%  90%  Weight:  89.8 kg (198 lb)   Height:  '5\' 5"'$  (1.651 m)    Wt Readings from Last 3 Encounters:  12/08/15 89.8 kg (198 lb)  12/07/15 89.8 kg (198 lb)  11/24/15 89.8 kg (198 lb)    PHYSICAL EXAMINATION:  Physical Exam  Vitals reviewed. Constitutional: She is oriented to person, place, and time. She appears well-developed and well-nourished. No distress.  HENT:  Head: Normocephalic and atraumatic.  Dry mucous membranes  Eyes: Conjunctivae and EOM are normal. Pupils are equal, round, and reactive to light. No scleral icterus.  Neck: Normal range of motion. Neck supple. No JVD present. No thyromegaly present.  Cardiovascular: Normal rate, regular rhythm and intact distal pulses.  Exam reveals no gallop and no friction rub.   No murmur heard. Respiratory: Effort normal and  breath sounds normal. No respiratory distress. She has no wheezes. She has no rales.  GI: Soft. Bowel sounds are normal. She exhibits no distension. There is no tenderness.  Musculoskeletal: Normal range of motion. She exhibits no edema.  No arthritis, no gout  Lymphadenopathy:    She has no cervical adenopathy.  Neurological: She is alert and oriented to person, place, and time. No cranial nerve deficit.  No dysarthria, no aphasia  Skin: Skin is warm and dry. No rash noted. No erythema.  Psychiatric: She has a normal mood and affect. Her behavior is normal. Judgment and thought content normal.    LABORATORY PANEL:   CBC  Recent Labs Lab 12/08/15 1940  WBC 13.5*  HGB 10.7*  HCT 32.2*  PLT 321   ------------------------------------------------------------------------------------------------------------------  Chemistries   Recent Labs Lab 12/08/15 1940  NA 139  K 3.6  CL 105  CO2 23  GLUCOSE 199*  BUN 10  CREATININE 0.63  CALCIUM 9.1  MG 1.6*  AST 19  ALT 23  ALKPHOS 201*  BILITOT 0.8   ------------------------------------------------------------------------------------------------------------------  Cardiac Enzymes  Recent Labs Lab 12/02/15 2126  TROPONINI <0.03   ------------------------------------------------------------------------------------------------------------------  RADIOLOGY:  Ct Head Wo Contrast  Result Date: 12/08/2015 CLINICAL DATA:  "Not feeling well" in a patient with Brain cancer. Records in epic care everywhere document a previous CT scan performed at [facility] on 10/26/2015 for history of adenocarcinoma and enhancing dural-based lesion in the right parietal lobe. EXAM: CT HEAD WITHOUT CONTRAST TECHNIQUE: Contiguous axial images were obtained from the base of the skull through the vertex without intravenous contrast. COMPARISON:  None. FINDINGS: Brain: High right parietal vasogenic edema is noted with a probable  peripheral 10 mm hyper attenuating lesion (image 26 series 2). Overlying craniotomy defect is evident. 8 mm hypo attenuating lesion is identified in the right thalamus, not described previously. Wedge-shaped area of hypoattenuation identified in the inferior right cerebellar hemisphere. On imaging today, this has an appearnace of encephalomalacia related infarct, but could certainly represent vasogenic edema from mass lesion. This finding was not described up on the CT report from [place] about 1 month ago, suggesting that it is a new interval finding. No evidence for midline shift. No hydrocephalus. No CT findings of acute hemorrhage. Vascular: Atherosclerotic calcification is visualized in the carotid arteries. No dense MCA sign. Major dural sinuses  are unremarkable. Skull: High right parietal craniotomy defect noted. Sinuses/Orbits: The visualized paranasal sinuses and mastoid air cells are clear. Visualized portions of the globes and intraorbital fat are unremarkable. Other: N/A IMPRESSION: 1. Chronic right parietal vasogenic edema with probable 10 mm hyper attenuating lesion towards the vertex. Overlying craniotomy defect suggest previous surgery in this region and vasogenic edema was described in this area on previous outside CT report from [place] dated 10/26/2015. 2. 8 mm hypo attenuating lesion right down was not described previously. Age indeterminate lacunar infarction could have this appearance. MRI without and with contrast may prove helpful to further evaluate. 3. Peripheral wedge-shaped area of apparent encephalomalacia in the inferior right cerebellar hemisphere from so not described previously. This could represent an area of post ischemic encephalomalacia but given the history of metastatic disease to the brain, vasogenic edema from metastatic deposit cannot be entirely excluded. This could also be better assessed by MRI. 4. No hydrocephalus or midline  shift. No evidence for acute hemorrhage. Electronically Signed   By: Misty Stanley M.D.   On: 12/08/2015 20:07    EKG:   Orders placed or performed during the hospital encounter of 12/08/15  . ED EKG  . ED EKG    IMPRESSION AND PLAN:  Principal Problem:   Intractable nausea and vomiting - when necessary antiemetics. IV fluids for hydration. Active Problems:   Brain metastasis [facility]) - patient has new brain metastases on imaging tonight. Oncology consult. Decadron given in the ED, we'll be continued on admission.   Poorly controlled diabetes mellitus (HCC) - sliding scale insulin with corresponding glucose checks and carb modified diet   Malignant neoplasm of lower lobe of right lung ([place]) - source of her metastatic cancer as above  All the records are reviewed and case discussed with ED provider. Management plans discussed with the patient and/or family.  DVT PROPHYLAXIS: SubQ lovenox  GI PROPHYLAXIS: PPI  ADMISSION STATUS: Observation  CODE STATUS: Full Code Status History    Date Active Date Inactive Code Status Order ID Comments User Context   12/03/2015  9:16 AM 12/03/2015 12:45 PM Full Code [id number]  Lance Coon, MD Inpatient   11/01/2014  1:23 AM 11/02/2014  5:08 PM Full Code [id number]  Harrie Foreman, MD Inpatient   08/26/2014 12:53 PM 09/02/2014  6:53 PM Full Code [id number]  Nestor Lewandowsky, MD Inpatient      TOTAL TIME TAKING CARE OF THIS PATIENT: 40 minutes.    [facility] FIELDING 12/08/2015, 11:28 PM  [facility]  Office  [phone]  CC: Primary care physician; Cletis [place], MD

## 2015-12-09 ENCOUNTER — Ambulatory Visit: Payer: Medicare Other

## 2015-12-09 ENCOUNTER — Inpatient Hospital Stay: Payer: Medicare Other

## 2015-12-09 DIAGNOSIS — C7931 Secondary malignant neoplasm of brain: Secondary | ICD-10-CM | POA: Diagnosis not present

## 2015-12-09 DIAGNOSIS — C3490 Malignant neoplasm of unspecified part of unspecified bronchus or lung: Secondary | ICD-10-CM | POA: Diagnosis not present

## 2015-12-09 DIAGNOSIS — R112 Nausea with vomiting, unspecified: Secondary | ICD-10-CM | POA: Diagnosis not present

## 2015-12-09 DIAGNOSIS — E1165 Type 2 diabetes mellitus with hyperglycemia: Secondary | ICD-10-CM | POA: Diagnosis not present

## 2015-12-09 LAB — CBC
HEMATOCRIT: 31.9 % — AB (ref 35.0–47.0)
Hemoglobin: 10.7 g/dL — ABNORMAL LOW (ref 12.0–16.0)
MCH: 30 pg (ref 26.0–34.0)
MCHC: 33.5 g/dL (ref 32.0–36.0)
MCV: 89.7 fL (ref 80.0–100.0)
PLATELETS: 287 10*3/uL (ref 150–440)
RBC: 3.56 MIL/uL — ABNORMAL LOW (ref 3.80–5.20)
RDW: 15 % — AB (ref 11.5–14.5)
WBC: 11.3 10*3/uL — AB (ref 3.6–11.0)

## 2015-12-09 LAB — BASIC METABOLIC PANEL
Anion gap: 10 (ref 5–15)
BUN: 13 mg/dL (ref 6–20)
CO2: 24 mmol/L (ref 22–32)
CREATININE: 0.69 mg/dL (ref 0.44–1.00)
Calcium: 8.5 mg/dL — ABNORMAL LOW (ref 8.9–10.3)
Chloride: 105 mmol/L (ref 101–111)
GFR calc Af Amer: >60 mL/min (ref >60)
GLUCOSE: 258 mg/dL — AB (ref 65–99)
Potassium: 4 mmol/L (ref 3.5–5.1)
SODIUM: 139 mmol/L (ref 135–145)

## 2015-12-09 LAB — URINALYSIS COMPLETE WITH MICROSCOPIC (ARMC ONLY)
Bilirubin Urine: NEGATIVE
Glucose, UA: 50 mg/dL — AB
Hgb urine dipstick: NEGATIVE
LEUKOCYTES UA: NEGATIVE
Nitrite: NEGATIVE
PROTEIN: 100 mg/dL — AB
SPECIFIC GRAVITY, URINE: 1.012 (ref 1.005–1.030)
pH: 6 (ref 5.0–8.0)

## 2015-12-09 LAB — GLUCOSE, CAPILLARY
GLUCOSE-CAPILLARY: 249 mg/dL — AB (ref 65–99)
GLUCOSE-CAPILLARY: 298 mg/dL — AB (ref 65–99)
GLUCOSE-CAPILLARY: 288 mg/dL — AB (ref 65–99)
GLUCOSE-CAPILLARY: 328 mg/dL — AB (ref 65–99)
Glucose-Capillary: 195 mg/dL — ABNORMAL HIGH (ref 65–99)
Glucose-Capillary: 286 mg/dL — ABNORMAL HIGH (ref 65–99)

## 2015-12-09 MED ORDER — ONDANSETRON HCL 4 MG PO TABS
4.0000 mg | ORAL_TABLET | Freq: Four times a day (QID) | ORAL | Status: DC | PRN
  Administered 2015-12-10: 4 mg via ORAL
  Filled 2015-12-09: qty 1

## 2015-12-09 MED ORDER — INSULIN ASPART 100 UNIT/ML ~~LOC~~ SOLN
0.0000 [IU] | Freq: Three times a day (TID) | SUBCUTANEOUS | Status: DC
  Administered 2015-12-09: 8 [IU] via SUBCUTANEOUS
  Administered 2015-12-10: 15 [IU] via SUBCUTANEOUS
  Administered 2015-12-10: 11 [IU] via SUBCUTANEOUS
  Administered 2015-12-10: 08:00:00 15 [IU] via SUBCUTANEOUS
  Administered 2015-12-11: 12:00:00 8 [IU] via SUBCUTANEOUS
  Administered 2015-12-11: 11 [IU] via SUBCUTANEOUS
  Administered 2015-12-11: 15 [IU] via SUBCUTANEOUS
  Administered 2015-12-12: 12:00:00 11 [IU] via SUBCUTANEOUS
  Administered 2015-12-12: 5 [IU] via SUBCUTANEOUS
  Filled 2015-12-09: qty 15
  Filled 2015-12-09: qty 8
  Filled 2015-12-09: qty 11
  Filled 2015-12-09: qty 15
  Filled 2015-12-09: qty 5
  Filled 2015-12-09 (×2): qty 11
  Filled 2015-12-09: qty 8
  Filled 2015-12-09: qty 15

## 2015-12-09 MED ORDER — ONDANSETRON HCL 4 MG/2ML IJ SOLN
4.0000 mg | Freq: Four times a day (QID) | INTRAMUSCULAR | Status: DC | PRN
  Administered 2015-12-10 – 2015-12-12 (×7): 4 mg via INTRAVENOUS
  Filled 2015-12-09 (×7): qty 2

## 2015-12-09 MED ORDER — SODIUM CHLORIDE 0.9% FLUSH
3.0000 mL | Freq: Two times a day (BID) | INTRAVENOUS | Status: DC
  Administered 2015-12-09 – 2015-12-12 (×8): 3 mL via INTRAVENOUS

## 2015-12-09 MED ORDER — ALPRAZOLAM 0.25 MG PO TABS
0.2500 mg | ORAL_TABLET | Freq: Two times a day (BID) | ORAL | Status: DC
  Administered 2015-12-09 – 2015-12-12 (×7): 0.25 mg via ORAL
  Filled 2015-12-09 (×7): qty 1

## 2015-12-09 MED ORDER — SULFAMETHOXAZOLE-TRIMETHOPRIM 800-160 MG PO TABS
1.0000 | ORAL_TABLET | Freq: Two times a day (BID) | ORAL | Status: DC
  Administered 2015-12-09: 08:00:00 1 via ORAL
  Filled 2015-12-09: qty 1

## 2015-12-09 MED ORDER — ENOXAPARIN SODIUM 40 MG/0.4ML ~~LOC~~ SOLN
40.0000 mg | Freq: Every day | SUBCUTANEOUS | Status: DC
  Administered 2015-12-09 – 2015-12-11 (×4): 40 mg via SUBCUTANEOUS
  Filled 2015-12-09 (×4): qty 0.4

## 2015-12-09 MED ORDER — PREGABALIN 75 MG PO CAPS
150.0000 mg | ORAL_CAPSULE | Freq: Two times a day (BID) | ORAL | Status: DC
  Administered 2015-12-09 – 2015-12-12 (×7): 150 mg via ORAL
  Filled 2015-12-09 (×7): qty 2

## 2015-12-09 MED ORDER — DULOXETINE HCL 60 MG PO CPEP
60.0000 mg | ORAL_CAPSULE | Freq: Every day | ORAL | Status: DC
  Administered 2015-12-09 – 2015-12-12 (×4): 60 mg via ORAL
  Filled 2015-12-09 (×4): qty 1

## 2015-12-09 MED ORDER — ACETAMINOPHEN 325 MG PO TABS: 650.0000 mg | ORAL_TABLET | Freq: Four times a day (QID) | ORAL | Status: DC | PRN

## 2015-12-09 MED ORDER — INSULIN ASPART 100 UNIT/ML ~~LOC~~ SOLN
0.0000 [IU] | Freq: Every day | SUBCUTANEOUS | Status: DC
  Administered 2015-12-09: 4 [IU] via SUBCUTANEOUS
  Administered 2015-12-11: 3 [IU] via SUBCUTANEOUS
  Filled 2015-12-09: qty 6
  Filled 2015-12-09: qty 3
  Filled 2015-12-09 (×2): qty 5
  Filled 2015-12-09: qty 4

## 2015-12-09 MED ORDER — ROSUVASTATIN CALCIUM 20 MG PO TABS
10.0000 mg | ORAL_TABLET | Freq: Every day | ORAL | Status: DC
  Administered 2015-12-09 – 2015-12-12 (×4): 10 mg via ORAL
  Filled 2015-12-09 (×4): qty 1

## 2015-12-09 MED ORDER — INSULIN GLARGINE 100 UNIT/ML ~~LOC~~ SOLN
30.0000 [IU] | Freq: Every day | SUBCUTANEOUS | Status: DC
  Administered 2015-12-09 – 2015-12-10 (×2): 30 [IU] via SUBCUTANEOUS
  Filled 2015-12-09 (×4): qty 0.3

## 2015-12-09 MED ORDER — PANTOPRAZOLE SODIUM 40 MG PO TBEC
40.0000 mg | DELAYED_RELEASE_TABLET | Freq: Every day | ORAL | Status: DC
  Administered 2015-12-09 – 2015-12-12 (×4): 40 mg via ORAL
  Filled 2015-12-09 (×4): qty 1

## 2015-12-09 MED ORDER — MOMETASONE FURO-FORMOTEROL FUM 200-5 MCG/ACT IN AERO
2.0000 | INHALATION_SPRAY | Freq: Two times a day (BID) | RESPIRATORY_TRACT | Status: DC
  Administered 2015-12-09 – 2015-12-12 (×7): 2 via RESPIRATORY_TRACT
  Filled 2015-12-09: qty 8.8

## 2015-12-09 MED ORDER — SODIUM CHLORIDE 0.9 % IV SOLN
INTRAVENOUS | Status: DC
  Administered 2015-12-09: 01:00:00 via INTRAVENOUS

## 2015-12-09 MED ORDER — INSULIN ASPART 100 UNIT/ML ~~LOC~~ SOLN
0.0000 [IU] | Freq: Three times a day (TID) | SUBCUTANEOUS | Status: DC
  Administered 2015-12-09 (×2): 5 [IU] via SUBCUTANEOUS
  Filled 2015-12-09: qty 5

## 2015-12-09 MED ORDER — ACETAMINOPHEN 650 MG RE SUPP: 650.0000 mg | Freq: Four times a day (QID) | RECTAL | Status: DC | PRN

## 2015-12-09 MED ORDER — OXYCODONE HCL 5 MG PO TABS
5.0000 mg | ORAL_TABLET | ORAL | Status: DC | PRN
Start: 2015-12-09 — End: 2015-12-10
  Administered 2015-12-09 – 2015-12-10 (×2): 5 mg via ORAL
  Filled 2015-12-09 (×2): qty 1

## 2015-12-09 MED ORDER — LEVETIRACETAM 500 MG PO TABS
500.0000 mg | ORAL_TABLET | Freq: Two times a day (BID) | ORAL | Status: DC
  Administered 2015-12-09 – 2015-12-12 (×7): 500 mg via ORAL
  Filled 2015-12-09 (×7): qty 1

## 2015-12-09 MED ORDER — ENSURE ENLIVE PO LIQD
237.0000 mL | Freq: Two times a day (BID) | ORAL | Status: DC
  Administered 2015-12-09 – 2015-12-12 (×7): 237 mL via ORAL

## 2015-12-09 NOTE — Progress Notes (Signed)
Initial Nutrition Assessment  DOCUMENTATION CODES:   Not applicable  INTERVENTION:  -Recommend liberalizing diet to Carb Modified only at present; if po intake remains poor, recommend liberalizing diet to Regular to optimize nutritional intake with additional insulin coverage to manage blood sugars (pt on decadron which is likely contributing to elevated blood sugars as well) -Recommend Ensure Enlive po BID, each supplement provides 350 kcal and 20 grams of protein  NUTRITION DIAGNOSIS:   Inadequate oral intake related to cancer and cancer related treatments, poor appetite as evidenced by per patient/family report, meal completion < 25%.   GOAL:   Patient will meet greater than or equal to 90% of their needs   MONITOR:   PO intake, Supplement acceptance, Labs, Weight trends  REASON FOR ASSESSMENT:   Malnutrition Screening Tool    ASSESSMENT:    60 yo female admitted with weakness, intractable N/V, new brain mets with hx of malignant lung cancer   Recorded po intake 20% at breakfast this AM; pt had not eaten anything at lunch today. Pt very groggy/sleepy on visit today. Pt arouseable but unable to stay awake very long to participate in exam. Unable to obtain diet or weight history on visit today  Past Medical History:  Diagnosis Date  . Arthritis   . Cancer ([place])    Lung CA right wedge removed  . CHF (congestive heart failure) ([place])   . COPD (chronic obstructive pulmonary disease) (HCC)    has oxygen but doesn't use  . Diabetes mellitus type 2 with neurological manifestations ([place])    Peripheral neuropathy and foot ulcers  . Hypertension   . PAD (peripheral artery disease) ([place])   . Peripheral neuropathy ([place])   . Shortness of breath dyspnea      Diet Order:  Diet Carb Modified Fluid consistency: Thin; Room service appropriate? Yes  Skin:  Wound (see comment) (diabetic foot ulcers, no pressure ulcers)  Last BM:  8/29   Labs:  Glucose Profile:   Recent  Labs  12/09/15 0440 12/09/15 0750 12/09/15 1207  GLUCAP 249* 298* 286*   Lab Results  Component Value Date   HGBA1C 11.4 (H) 12/03/2015   Meds: decadron, ss novolog, lantus  Height:   Ht Readings from Last 1 Encounters:  12/08/15 '5\' 5"'$  (1.651 m)    Weight:   Wt Readings from Last 1 Encounters:  12/08/15 198 lb (89.8 kg)   Filed Weights   12/08/15 1905  Weight: 198 lb (89.8 kg)   Weights/Temps    Wt Readings from Last 10 Encounters:  12/08/15 198 lb (89.8 kg)  12/07/15 198 lb (89.8 kg)  11/24/15 198 lb (89.8 kg)  11/18/15 206 lb (93.4 kg)  11/04/15 199 lb 2 oz (90.3 kg)  05/18/15 199 lb 15.3 oz (90.7 kg)  03/12/15 200 lb (90.7 kg)  11/13/14 191 lb 12.8 oz (87 kg)  11/02/14 188 lb 6.4 oz (85.5 kg)  10/02/14 183 lb (83 kg)       BMI:  Body mass index is 32.95 kg/m.  Estimated Nutritional Needs:   Kcal:  1950-2250 kcals  Protein:  100-115 g  Fluid:  >/= 2 L  EDUCATION NEEDS:   No education needs identified at this time  [place], [place], LDN [phone] Pager  [phone] Weekend/On-Call Pager

## 2015-12-09 NOTE — ED Notes (Signed)
Report given to floor RN. Pt taken to floor via stretcher. Vital signs stable prior to transport.  

## 2015-12-09 NOTE — Progress Notes (Signed)
Inpatient Diabetes Program Recommendations  AACE/ADA: New Consensus Statement on Inpatient Glycemic Control (2015)  Target Ranges:  Prepandial:   less than 140 mg/dL      Peak postprandial:   less than 180 mg/dL (1-2 hours)      Critically ill patients:  140 - 180 mg/dL   Lab Results  Component Value Date   GLUCAP 298 (H) 12/09/2015   HGBA1C 11.4 (H) 12/03/2015    Review of Glycemic Control:  Results for [facility], RUNKEL (MRN [id number]) as of 12/09/2015 09:45  Ref. Range 12/07/2015 07:29 12/07/2015 11:56 12/09/2015 01:19 12/09/2015 04:40 12/09/2015 07:50  Glucose-Capillary Latest Ref Range: 65 - 99 mg/dL 209 (H) 179 (H) 195 (H) 249 (H) 298 (H)    Diabetes history: Type 2 diabetes Outpatient Diabetes medications: Toujeo 50 units daily, Glucotrol 5 mg bid, Metformin 500 mg bid Current orders for Inpatient glycemic control:  Novolog sensitive tid with meals and HS-Decadron 8 mg q 6 hours  Inpatient Diabetes Program Recommendations:    Please restart a portion of patient's home dose of Toujeo.  Consider Lantus 30 units daily (this is Hospital substitution for Toujeo).  Also consider increasing Novolog correction to moderate tid with meals.  Thanks,  Adah Perl, RN, BC-ADM Inpatient Diabetes Coordinator Pager [phone] (8a-5p)

## 2015-12-09 NOTE — Progress Notes (Signed)
[facility] NAME: Marie [name]    MRN#:  [id number]  DATE OF BIRTH:  21-Sep-1955  SUBJECTIVE:  Hospital Day: 0 days Marie [name] is a 60 y.o. female presenting with Weakness .   Overnight events: No acute overnight events Interval Events: Complains of headache "feels like someone hip hit the back of my head"intensity 2/10  REVIEW OF SYSTEMS:  CONSTITUTIONAL: No fever, fatigue or weakness.  EYES: No blurred or double vision.  EARS, NOSE, AND THROAT: No tinnitus or ear pain.  RESPIRATORY: No cough, shortness of breath, wheezing or hemoptysis.  CARDIOVASCULAR: No chest pain, orthopnea, edema.  GASTROINTESTINAL: No nausea, vomiting, diarrhea or abdominal pain.  GENITOURINARY: No dysuria, hematuria.  ENDOCRINE: No polyuria, nocturia,  HEMATOLOGY: No anemia, easy bruising or bleeding SKIN: No rash or lesion. MUSCULOSKELETAL: No joint pain or arthritis.   NEUROLOGIC: No tingling, numbness, weakness.  PSYCHIATRY: No anxiety or depression.   DRUG ALLERGIES:   Allergies  Allergen Reactions  . Ampicillin Anaphylaxis  . Penicillins Anaphylaxis and Shortness Of Breath    Has patient had a PCN reaction causing immediate rash, facial/tongue/throat swelling, SOB or lightheadedness with hypotension: yes Has patient had a PCN reaction causing severe rash involving mucus membranes or skin necrosis: no Has patient had a PCN reaction that required hospitalization no Has patient had a PCN reaction occurring within the last 10 years: no If all of the above answers are "NO", then may proceed with Cephalosporin use.   . Avelox [Moxifloxacin Hcl In Nacl] Other (See Comments)    Reaction: MUSCLE CRAMPS  . Etodolac Rash  . Neurontin [Gabapentin] Anxiety    VITALS:  Blood pressure 130/74, pulse 91, temperature 97.8 F (36.6 C), temperature source Oral, resp. rate 20, height '5\' 5"'$  (1.651 m), weight 89.8 kg (198 lb), SpO2 91  %.  PHYSICAL EXAMINATION:  VITAL SIGNS: Vitals:   12/09/15 0441 12/09/15 0841  BP: 136/84 130/74  Pulse: 94 91  Resp: 16 20  Temp: 97.6 F (36.4 C) 97.8 F (36.6 C)   GENERAL:60 y.o.female currently in no acute distress.  HEAD: Normocephalic, atraumatic.  EYES: Pupils equal, round, reactive to light. Extraocular muscles intact. No scleral icterus.  MOUTH: Moist mucosal membrane. Dentition intact. No abscess noted.  EAR, NOSE, THROAT: Clear without exudates. No external lesions.  NECK: Supple. No thyromegaly. No nodules. No JVD.  PULMONARY: Clear to ascultation, without wheeze rails or rhonci. No use of accessory muscles, Good respiratory effort. good air entry bilaterally CHEST: Nontender to palpation.  CARDIOVASCULAR: S1 and S2. Regular rate and rhythm. No murmurs, rubs, or gallops. No edema. Pedal pulses 2+ bilaterally.  GASTROINTESTINAL: Soft, nontender, nondistended. No masses. Positive bowel sounds. No hepatosplenomegaly.  MUSCULOSKELETAL: No swelling, clubbing, or edema. Range of motion full in all extremities.  NEUROLOGIC: Cranial nerves II through XII are intact. No gross focal neurological deficits. Sensation intact. Reflexes intact.  SKIN: No ulceration, lesions, rashes, or cyanosis. Skin warm and dry. Turgor intact.  PSYCHIATRIC: Mood, affect within normal limits. The patient is awake, alert and oriented x 3. Insight, judgment intact.      LABORATORY PANEL:   CBC  Recent Labs Lab 12/09/15 0500  WBC 11.3*  HGB 10.7*  HCT 31.9*  PLT 287   ------------------------------------------------------------------------------------------------------------------  Chemistries   Recent Labs Lab 12/08/15 1940 12/09/15 0500  NA 139 139  K 3.6 4.0  CL 105 105  CO2 23 24  GLUCOSE 199* 258*  BUN 10  13  CREATININE 0.63 0.69  CALCIUM 9.1 8.5*  MG 1.6*  --   AST 19  --   ALT 23  --   ALKPHOS 201*  --   BILITOT 0.8  --     ------------------------------------------------------------------------------------------------------------------  Cardiac Enzymes  Recent Labs Lab 12/02/15 2126  TROPONINI <0.03   ------------------------------------------------------------------------------------------------------------------  RADIOLOGY:  Ct Head Wo Contrast  Result Date: 12/08/2015 CLINICAL DATA:  "Not feeling well" in a patient with Brain cancer. Records in epic care everywhere document a previous CT scan performed at [facility] on 10/26/2015 for history of adenocarcinoma and enhancing dural-based lesion in the right parietal lobe. EXAM: CT HEAD WITHOUT CONTRAST TECHNIQUE: Contiguous axial images were obtained from the base of the skull through the vertex without intravenous contrast. COMPARISON:  None. FINDINGS: Brain: High right parietal vasogenic edema is noted with a probable peripheral 10 mm hyper attenuating lesion (image 26 series 2). Overlying craniotomy defect is evident. 8 mm hypo attenuating lesion is identified in the right thalamus, not described previously. Wedge-shaped area of hypoattenuation identified in the inferior right cerebellar hemisphere. On imaging today, this has an appearnace of encephalomalacia related infarct, but could certainly represent vasogenic edema from mass lesion. This finding was not described up on the CT report from [place] about 1 month ago, suggesting that it is a new interval finding. No evidence for midline shift. No hydrocephalus. No CT findings of acute hemorrhage. Vascular: Atherosclerotic calcification is visualized in the carotid arteries. No dense MCA sign. Major dural sinuses are unremarkable. Skull: High right parietal craniotomy defect noted. Sinuses/Orbits: The visualized paranasal sinuses and mastoid air cells are clear. Visualized portions of the globes and intraorbital fat are unremarkable. Other: N/A IMPRESSION: 1. Chronic right  parietal vasogenic edema with probable 10 mm hyper attenuating lesion towards the vertex. Overlying craniotomy defect suggest previous surgery in this region and vasogenic edema was described in this area on previous outside CT report from [place] dated 10/26/2015. 2. 8 mm hypo attenuating lesion right down was not described previously. Age indeterminate lacunar infarction could have this appearance. MRI without and with contrast may prove helpful to further evaluate. 3. Peripheral wedge-shaped area of apparent encephalomalacia in the inferior right cerebellar hemisphere from so not described previously. This could represent an area of post ischemic encephalomalacia but given the history of metastatic disease to the brain, vasogenic edema from metastatic deposit cannot be entirely excluded. This could also be better assessed by MRI. 4. No hydrocephalus or midline shift. No evidence for acute hemorrhage. Electronically Signed   By: Misty Stanley M.D.   On: 12/08/2015 20:07    EKG:   Orders placed or performed during the hospital encounter of 12/08/15  . ED EKG  . ED EKG    ASSESSMENT AND PLAN:   Marie [name] is a 60 y.o. female presenting with Weakness . Admitted 12/08/2015 : Day #: 0 days   1. Brain metastases: Oncology consult, continue Decadron, question repeat radiation treatments though unsure of total centigray received in prior brain radiation treatments. 2. Type 2 diabetes: Restart home medications, add Lantus, increase sliding coverage-patient will need higher doses given steroids 3. Metastatic lung cancer: As above   All the records are reviewed and case discussed with Care Management/Social Workerr. Management plans discussed with the patient, family and they are in agreement.  CODE STATUS: full TOTAL TIME TAKING CARE OF THIS PATIENT: 33 minutes.   POSSIBLE D/C IN 1-2DAYS, DEPENDING ON  CLINICAL CONDITION.   Hower,  Karenann Cai.D on 12/09/2015 at 11:59  AM  Between 7am to 6pm - Pager - [phone]  After 6pm: House Pager: - [facility]  Office  [phone]  CC: Primary care physician; Cletis Athens, MD

## 2015-12-09 NOTE — Care Management (Signed)
Admitted to [facility] with the diagnosis of intractable nausea and vomiting under observation status. Bone cancer and new diagnosis of brain cancer. Lives with boyfriend, Derinda Late [phone]). Discharged from this facility 12/07/15. Received radiation at the [facility] in the past, Home oxygen that she uses prn. Unable to assist with medications due to the fact that Ms. Hoelzer has private insurance through [employer]. Gets medications that are cancer related through [facility]. Primary care physician is Dr. Lavera Guise. Poor appetite since being discharged from this facility. States she receives home health services through [facility].  Shelbie Ammons RN MSN CCM Care Management [phone]

## 2015-12-09 NOTE — Plan of Care (Signed)
Problem: Pain Managment: Goal: General experience of comfort will improve Outcome: Progressing Oxycodone 5 mg tablet tablet times 1 (one) dose. Improvement noted.

## 2015-12-09 NOTE — Care Management Obs Status (Signed)
[place] NOTIFICATION   Patient Details  Name: Marie [name] MRN: [id number] Date of Birth: 1956/01/10   Medicare Observation Status Notification Given:  Yes    Shelbie Ammons, RN 12/09/2015, 8:37 AM

## 2015-12-10 ENCOUNTER — Inpatient Hospital Stay: Payer: Medicare Other

## 2015-12-10 ENCOUNTER — Observation Stay: Payer: Medicare Other

## 2015-12-10 ENCOUNTER — Inpatient Hospital Stay: Payer: Medicare Other | Admitting: Internal Medicine

## 2015-12-10 ENCOUNTER — Ambulatory Visit
Admit: 2015-12-10 | Discharge: 2015-12-10 | Disposition: A | Discharge status: Home / Self Care | Payer: Medicare Other | Attending: Radiation Oncology | Admitting: Radiation Oncology

## 2015-12-10 ENCOUNTER — Ambulatory Visit: Payer: Medicare Other

## 2015-12-10 DIAGNOSIS — C7931 Secondary malignant neoplasm of brain: Secondary | ICD-10-CM

## 2015-12-10 DIAGNOSIS — R4182 Altered mental status, unspecified: Secondary | ICD-10-CM

## 2015-12-10 DIAGNOSIS — G893 Neoplasm related pain (acute) (chronic): Secondary | ICD-10-CM

## 2015-12-10 DIAGNOSIS — Z79899 Other long term (current) drug therapy: Secondary | ICD-10-CM

## 2015-12-10 DIAGNOSIS — R5381 Other malaise: Secondary | ICD-10-CM

## 2015-12-10 DIAGNOSIS — C3490 Malignant neoplasm of unspecified part of unspecified bronchus or lung: Secondary | ICD-10-CM | POA: Diagnosis not present

## 2015-12-10 DIAGNOSIS — M1612 Unilateral primary osteoarthritis, left hip: Secondary | ICD-10-CM | POA: Diagnosis not present

## 2015-12-10 DIAGNOSIS — Z66 Do not resuscitate: Secondary | ICD-10-CM | POA: Diagnosis not present

## 2015-12-10 DIAGNOSIS — C3431 Malignant neoplasm of lower lobe, right bronchus or lung: Secondary | ICD-10-CM

## 2015-12-10 DIAGNOSIS — I639 Cerebral infarction, unspecified: Secondary | ICD-10-CM | POA: Diagnosis not present

## 2015-12-10 DIAGNOSIS — G629 Polyneuropathy, unspecified: Secondary | ICD-10-CM

## 2015-12-10 DIAGNOSIS — R630 Anorexia: Secondary | ICD-10-CM

## 2015-12-10 DIAGNOSIS — R112 Nausea with vomiting, unspecified: Secondary | ICD-10-CM | POA: Diagnosis not present

## 2015-12-10 DIAGNOSIS — M549 Dorsalgia, unspecified: Secondary | ICD-10-CM

## 2015-12-10 DIAGNOSIS — Z789 Other specified health status: Secondary | ICD-10-CM

## 2015-12-10 DIAGNOSIS — E1165 Type 2 diabetes mellitus with hyperglycemia: Secondary | ICD-10-CM | POA: Diagnosis not present

## 2015-12-10 DIAGNOSIS — R110 Nausea: Secondary | ICD-10-CM

## 2015-12-10 DIAGNOSIS — Z794 Long term (current) use of insulin: Secondary | ICD-10-CM

## 2015-12-10 DIAGNOSIS — I10 Essential (primary) hypertension: Secondary | ICD-10-CM

## 2015-12-10 DIAGNOSIS — C3411 Malignant neoplasm of upper lobe, right bronchus or lung: Secondary | ICD-10-CM

## 2015-12-10 DIAGNOSIS — Z7952 Long term (current) use of systemic steroids: Secondary | ICD-10-CM

## 2015-12-10 DIAGNOSIS — R51 Headache: Secondary | ICD-10-CM

## 2015-12-10 DIAGNOSIS — M25551 Pain in right hip: Secondary | ICD-10-CM

## 2015-12-10 DIAGNOSIS — R531 Weakness: Secondary | ICD-10-CM

## 2015-12-10 DIAGNOSIS — R262 Difficulty in walking, not elsewhere classified: Secondary | ICD-10-CM

## 2015-12-10 DIAGNOSIS — J449 Chronic obstructive pulmonary disease, unspecified: Secondary | ICD-10-CM

## 2015-12-10 DIAGNOSIS — Z515 Encounter for palliative care: Secondary | ICD-10-CM

## 2015-12-10 DIAGNOSIS — I739 Peripheral vascular disease, unspecified: Secondary | ICD-10-CM

## 2015-12-10 DIAGNOSIS — C787 Secondary malignant neoplasm of liver and intrahepatic bile duct: Secondary | ICD-10-CM | POA: Diagnosis not present

## 2015-12-10 DIAGNOSIS — F17210 Nicotine dependence, cigarettes, uncomplicated: Secondary | ICD-10-CM

## 2015-12-10 DIAGNOSIS — I63233 Cerebral infarction due to unspecified occlusion or stenosis of bilateral carotid arteries: Secondary | ICD-10-CM | POA: Diagnosis not present

## 2015-12-10 DIAGNOSIS — C719 Malignant neoplasm of brain, unspecified: Secondary | ICD-10-CM | POA: Diagnosis not present

## 2015-12-10 LAB — GLUCOSE, CAPILLARY
GLUCOSE-CAPILLARY: 362 mg/dL — AB (ref 65–99)
GLUCOSE-CAPILLARY: 376 mg/dL — AB (ref 65–99)
GLUCOSE-CAPILLARY: 320 mg/dL — AB (ref 65–99)
Glucose-Capillary: 497 mg/dL — ABNORMAL HIGH (ref 65–99)

## 2015-12-10 LAB — GLUCOSE, RANDOM: Glucose, Bld: 448 mg/dL — ABNORMAL HIGH (ref 65–99)

## 2015-12-10 MED ORDER — GADOBENATE DIMEGLUMINE 529 MG/ML IV SOLN
20.0000 mL | Freq: Once | INTRAVENOUS | Status: AC | PRN
  Administered 2015-12-10: 10:00:00 18 mL via INTRAVENOUS

## 2015-12-10 MED ORDER — INSULIN ASPART 100 UNIT/ML ~~LOC~~ SOLN
6.0000 [IU] | Freq: Once | SUBCUTANEOUS | Status: AC
  Administered 2015-12-10: 6 [IU] via SUBCUTANEOUS

## 2015-12-10 MED ORDER — SENNOSIDES-DOCUSATE SODIUM 8.6-50 MG PO TABS
2.0000 | ORAL_TABLET | Freq: Every evening | ORAL | Status: DC | PRN
  Administered 2015-12-10: 17:00:00 2 via ORAL
  Filled 2015-12-10 (×2): qty 2

## 2015-12-10 MED ORDER — ASPIRIN EC 81 MG PO TBEC
81.0000 mg | DELAYED_RELEASE_TABLET | Freq: Every day | ORAL | Status: DC
  Administered 2015-12-10 – 2015-12-12 (×3): 81 mg via ORAL
  Filled 2015-12-10 (×4): qty 1

## 2015-12-10 MED ORDER — OXYCODONE HCL 5 MG PO TABS
10.0000 mg | ORAL_TABLET | ORAL | Status: DC | PRN
Start: 2015-12-10 — End: 2015-12-12
  Administered 2015-12-10 – 2015-12-12 (×8): 10 mg via ORAL
  Filled 2015-12-10 (×8): qty 2

## 2015-12-10 MED ORDER — ONDANSETRON HCL 4 MG/2ML IJ SOLN
4.0000 mg | INTRAMUSCULAR | Status: AC
  Administered 2015-12-10: 13:00:00 4 mg via INTRAVENOUS
  Filled 2015-12-10: qty 2

## 2015-12-10 NOTE — Consult Note (Signed)
Consultation Note Date: 12/10/2015   Patient Name: Marie [name]  DOB: [birth year]  MRN: [id number]  Age / Sex: 60 y.o., female  PCP: Cletis Athens, MD Referring Physician: Hillary Bow, MD  Reason for Consultation: Establishing goals of care and Psychosocial/spiritual support  HPI/Patient Profile: 60 y.o. female   admitted on 12/08/2015  with weakness, poor appetite, intractable nausea and pain.  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 Early subacute appearing - mostly lacunar type - infarcts in the right PICA territory, scattered both PCA territories, and also in the right MCA/PCA territory (in the area of vasogenic edema described in #2). No associated hemorrhage or infarct related mass effect. 2. Right parietal craniotomy and resection cavity. Mildly irregular dural/meningeal thickening at the resection site with regional vasogenic edema and mild mass effect. This appearance is suspicious for metastatic disease recurrence, and comparison with previous brain MRIs is recommended. 3. No other metastatic disease to the brain suspected; widespread smooth dural thickening over both hemispheres is favored to be postoperative in nature.  Continued physical and functional decline over the past several months.  Faces advanced directive decisions and anticipatory care needs.  Complicated psycho-social situation.  Lives at home with SO, who also has  MM, adult special needs 75 yo son.  Gross financial needs/hardships.  ( I did contact Barnabas Lister the SW in the cancer center who has worked with them in the past, he tells me he will contact family today and assist as he can)  Clinical Assessment and Goals of Care:  This NP Wadie Lessen reviewed medical records, received report from team, assessed the patient and then meet at the patient's bedside along with  to discuss diagnosis, prognosis, GOC, EOL wishes disposition and options.  I then spoke to her [place] to review the same.  A  discussion was had today regarding advanced directives.  Concepts specific to code status was had.  The difference between a aggressive medical intervention path  and a palliative comfort care path for this patient at this time was had.  Values and goals of care important to patient and family were attempted to be elicited.  Patient was able to verbalize her belief that she thinks "I'm going to die soon and that's OK, I'm just very worried for my sin"    Concept of Hospice and Palliative Care were discussed  Natural trajectory and expectations at EOL were discussed.  Questions and concerns addressed.   Family encouraged to call with questions or concerns.  PMT will continue to support holistically.    SUMMARY OF RECOMMENDATIONS  Code Status/Advance Care Planning:  DNRdocumented today   Symptom Management:  Patient c/o of uncontrolled back, leg and headache pain  and rates at 8 out of 10  Pain: Increase Oxycodone to 10 mg po every 4 hrs prn  Palliative Prophylaxis:   Bowel Regimen, Delirium Protocol, Frequent Pain Assessment and Oral Care  Additional Recommendations (Limitations, Scope, Preferences):   For now she is open to all offered and available medical interventions to prolong quality life, but if  "none of this is going to help I just want to go home and do what I want". She very much looks to oncology for direction.    Psycho-social/Spiritual:   Desire for further Chaplaincy support:yes  Additional Recommendations: Education on Hospice  Prognosis:   < 6 months, dependant on desire for life prolong interventions   Discharge Planning: To Be Determined      Primary Diagnoses: Present on Admission: . Intractable nausea and vomiting . Malignant neoplasm of lower lobe of right lung ([place]) . Poorly controlled diabetes mellitus ([place]) . Brain metastasis ([place])   I have reviewed the medical record, interviewed the patient and family, and examined the patient. The following aspects are pertinent.  Past Medical History:  Diagnosis Date  . Arthritis   . Cancer ([place])    Lung CA right wedge removed  . CHF (congestive heart failure) ([place])   . COPD (chronic obstructive pulmonary disease) ([facility])    has oxygen but doesn't use  . Diabetes mellitus type 2 with neurological manifestations ([place])    Peripheral neuropathy and foot ulcers  . Hypertension   . PAD (peripheral artery disease) ([place])   . Peripheral neuropathy ([place])   . Shortness of breath dyspnea    Social History   Social History  . Marital status: Widowed    Spouse name: N/A  . Number of children: N/A  . Years of education: N/A   Social History Main Topics  . Smoking status: Current Every Day Smoker    Packs/day: 0.50    Years: 46.00    Types: Cigarettes  . Smokeless tobacco: Never Used  . Alcohol use No  . Drug use: No  . Sexual activity: Not Asked   Other Topics Concern  . None   Social History Narrative  . None   Family History  Problem Relation Age of Onset  . Hypertension Brother   . Coronary artery disease Mother   . Diabetes Mellitus II Mother   . Coronary artery disease Father   . Diabetes Mellitus II Father   . Diabetes Mellitus II Brother    Scheduled Meds: . ALPRAZolam  0.25 mg Oral BID  . dexamethasone  8 mg Oral Q6H  . DULoxetine  60 mg Oral Daily  . enoxaparin (LOVENOX) injection  40 mg Subcutaneous QHS   . feeding supplement (ENSURE ENLIVE)  237 mL Oral BID BM  . insulin aspart  0-15 Units Subcutaneous TID WC  . insulin aspart  0-5 Units Subcutaneous QHS  . insulin glargine  30 Units Subcutaneous QHS  . levETIRAcetam  500 mg Oral BID  . mometasone-formoterol  2 puff Inhalation BID  . pantoprazole  40 mg Oral QAC breakfast  . pregabalin  150 mg Oral BID  . rosuvastatin  10 mg Oral Daily  . sodium chloride flush  3 mL Intravenous Q12H   Continuous Infusions:  PRN Meds:.acetaminophen **OR** acetaminophen, HYDROmorphone (DILAUDID) injection, ondansetron **OR** ondansetron (ZOFRAN) IV, oxyCODONE Medications Prior to Admission:  Prior to Admission medications  Medication Sig Start Date End Date Taking? Authorizing Provider  ALPRAZolam (XANAX) 0.25 MG tablet Take 1 tablet (0.25 mg total) by mouth 2 (two) times daily. 11/18/15  Yes Cammie Sickle, MD  budesonide-formoterol (SYMBICORT) 160-4.5 MCG/ACT inhaler Inhale 2 puffs into the lungs 2 (two) times daily. Reported on 05/20/2015   Yes Historical Provider, MD  cyclobenzaprine (FLEXERIL) 10 MG tablet Take 10 mg by mouth 3 (three) times daily.    Yes Historical Provider, MD  dexamethasone (DECADRON) 4 MG tablet Take 1 tablet (4 mg total) by mouth daily. 11/12/15  Yes Noreene Filbert, MD  diazepam (VALIUM) 5 MG tablet Take 2 mg by mouth daily.  08/15/14  Yes Historical Provider, MD  diphenhydrAMINE (BENADRYL) 25 MG tablet Take 25 mg by mouth at bedtime.   Yes Historical Provider, MD  DULoxetine (CYMBALTA) 60 MG capsule Take 60 mg by mouth daily.   Yes Historical Provider, MD  gemfibrozil (LOPID) 600 MG tablet Take 600 mg by mouth 2 (two) times daily before a meal.   Yes Historical Provider, MD  glipiZIDE (GLUCOTROL) 5 MG tablet Take 5 mg by mouth 2 (two) times daily before a meal.  06/26/14  Yes Historical Provider, MD  ibuprofen (ADVIL,MOTRIN) 100 MG tablet Take 100 mg by mouth every 6 (six) hours as needed for fever.   Yes Historical Provider, MD   Insulin Glargine (TOUJEO SOLOSTAR) 300 UNIT/ML SOPN Inject 50 Units into the skin daily. 12/07/15  Yes Henreitta Leber, MD  levETIRAcetam (KEPPRA) 500 MG tablet Take 1 tablet by mouth 2 (two) times daily. 12/01/15  Yes Historical Provider, MD  LYRICA 150 MG capsule Take 150 mg by mouth 2 (two) times daily.  10/29/15  Yes Historical Provider, MD  metFORMIN (GLUCOPHAGE) 1000 MG tablet Take 500 mg by mouth 2 (two) times daily with a meal.  05/19/15  Yes Historical Provider, MD  oxyCODONE (OXY IR/ROXICODONE) 5 MG immediate release tablet 1-2 tabs every 4 hours as needed for severe pain 11/25/15  Yes Cammie Sickle, MD  pantoprazole (PROTONIX) 40 MG tablet Take 40 mg by mouth daily.  10/28/15 10/27/16 Yes Historical Provider, MD  promethazine (PHENERGAN) 12.5 MG tablet Take 1 tablet (12.5 mg total) by mouth every 6 (six) hours as needed for nausea or vomiting. 11/19/15  Yes Cammie Sickle, MD  rosuvastatin (CRESTOR) 10 MG tablet Take 10 mg by mouth daily.   Yes Historical Provider, MD  senna-docusate (SENOKOT-S) 8.6-50 MG tablet Take 1 tablet by mouth daily as needed for mild constipation.  10/28/15 10/27/16 Yes Historical Provider, MD  sulfamethoxazole-trimethoprim (BACTRIM DS,SEPTRA DS) 800-160 MG tablet Take 1 tablet by mouth 2 (two) times daily. 12/07/15  Yes Henreitta Leber, MD   Allergies  Allergen Reactions  . Ampicillin Anaphylaxis  . Penicillins Anaphylaxis and Shortness Of Breath    Has patient had a PCN reaction causing immediate rash, facial/tongue/throat swelling, SOB or lightheadedness with hypotension: yes Has patient had a PCN reaction causing severe rash involving mucus membranes or skin necrosis: no Has patient had a PCN reaction that required hospitalization no Has patient had a PCN reaction occurring within the last 10 years: no If all of the above answers are "NO", then may proceed with Cephalosporin use.   . Avelox [Moxifloxacin Hcl In Nacl] Other (See Comments)    Reaction:  MUSCLE CRAMPS  . Etodolac Rash  . Neurontin [Gabapentin] Anxiety   Review of Systems  Constitutional: Positive for activity change and fatigue.  Musculoskeletal: Positive for back  pain.  Neurological: Positive for weakness.    Physical Exam  Vital Signs: BP (!) 160/96 (BP Location: Left Arm)   Pulse 92   Temp 97.4 F (36.3 C) (Oral)   Resp (!) 24   Ht '5\' 5"'$  (1.651 m)   Wt 89.8 kg (198 lb)   SpO2 91%   BMI 32.95 kg/m  Pain Assessment: 0-10 POSS *See Group Information*: 1-Acceptable,Awake and alert Pain Score: Asleep   SpO2: SpO2: 91 % O2 Device:SpO2: 91 % O2 Flow Rate: .O2 Flow Rate (L/min): 3 L/min  IO: Intake/output summary:  Intake/Output Summary (Last 24 hours) at 12/10/15 1208 Last data filed at 12/10/15 0900  Gross per 24 hour  Intake           985.25 ml  Output             1400 ml  Net          -414.75 ml    LBM: Last BM Date: 12/08/15 Baseline Weight: Weight: 89.8 kg (198 lb) Most recent weight: Weight: 89.8 kg (198 lb)      Palliative Assessment/Data:  30 % at best   Flowsheet Rows   Flowsheet Row Most Recent Value  Intake Tab  Referral Department  Hospitalist  Unit at Time of Referral  Oncology Unit  Palliative Care Primary Diagnosis  Cancer  Date Notified  12/10/15  Palliative Care Type  New Palliative care  Reason for referral  Clarify Goals of Care  Date of Admission  12/08/15  # of days IP prior to Palliative referral  2  Clinical Assessment  Psychosocial & Spiritual Assessment  Palliative Care Outcomes      Discussed with Dr Darvin Neighbours  Time In: 1130 Time Out: 1245 Time Total: 75 min Greater than 50%  of this time was spent counseling and coordinating care related to the above assessment and plan.  Signed by: Wadie Lessen, NP   Please contact Palliative Medicine Team phone at [phone] for questions and concerns.  For individual provider: See Shea Evans

## 2015-12-10 NOTE — Progress Notes (Signed)
Marie [name] at [place] NAME: Marie [name]    MRN#:  [id number]  DATE OF BIRTH:  1955-04-29  SUBJECTIVE:  Hospital Day: 0 days Marie [name] is a 60 y.o. female presenting with Weakness .   Still has headache, generalized weakness and vomiting  REVIEW OF SYSTEMS:  CONSTITUTIONAL: No fever, fatigue or weakness.  EYES: No blurred or double vision.  EARS, NOSE, AND THROAT: No tinnitus or ear pain.  RESPIRATORY: No cough, shortness of breath, wheezing or hemoptysis.  CARDIOVASCULAR: No chest pain, orthopnea, edema.  GASTROINTESTINAL: Nausea and vomiting GENITOURINARY: No dysuria, hematuria.  ENDOCRINE: No polyuria, nocturia,  HEMATOLOGY: No anemia, easy bruising or bleeding SKIN: No rash or lesion. MUSCULOSKELETAL: No joint pain or arthritis.   NEUROLOGIC: No tingling, numbness, weakness.  PSYCHIATRY: No anxiety or depression.   DRUG ALLERGIES:   Allergies  Allergen Reactions  . Ampicillin Anaphylaxis  . Penicillins Anaphylaxis and Shortness Of Breath    Has patient had a PCN reaction causing immediate rash, facial/tongue/throat swelling, SOB or lightheadedness with hypotension: yes Has patient had a PCN reaction causing severe rash involving mucus membranes or skin necrosis: no Has patient had a PCN reaction that required hospitalization no Has patient had a PCN reaction occurring within the last 10 years: no If all of the above answers are "NO", then may proceed with Cephalosporin use.   . Avelox [Moxifloxacin Hcl In Nacl] Other (See Comments)    Reaction: MUSCLE CRAMPS  . Etodolac Rash  . Neurontin [Gabapentin] Anxiety    VITALS:  Blood pressure (!) 160/96, pulse 92, temperature 97.4 F (36.3 C), temperature source Oral, resp. rate (!) 24, height '5\' 5"'$  (1.651 m), weight 89.8 kg (198 lb), SpO2 91 %.  PHYSICAL EXAMINATION:  VITAL SIGNS: Vitals:   12/09/15 2022 12/10/15 0529  BP: (!) 148/83 (!) 160/96  Pulse: 96 92   Resp: (!) 24   Temp: 99.1 F (37.3 C) 97.4 F (36.3 C)   GENERAL:60 y.o.female currently in no acute distress.  HEAD: Normocephalic, atraumatic.  EYES: Pupils equal, round, reactive to light. Extraocular muscles intact. No scleral icterus.  MOUTH: Moist mucosal membrane. Dentition intact. No abscess noted.  EAR, NOSE, THROAT: Clear without exudates. No external lesions.  NECK: Supple. No thyromegaly. No nodules. No JVD.  PULMONARY: Clear to ascultation, without wheeze rails or rhonci. No use of accessory muscles, Good respiratory effort. good air entry bilaterally CHEST: Nontender to palpation.  CARDIOVASCULAR: S1 and S2. Regular rate and rhythm. No murmurs, rubs, or gallops. No edema. Pedal pulses 2+ bilaterally.  GASTROINTESTINAL: Soft, nontender, nondistended. No masses. Positive bowel sounds. No hepatosplenomegaly.  MUSCULOSKELETAL: No swelling, clubbing, or edema. Range of motion full in all extremities.  NEUROLOGIC: Cranial nerves II through XII are intact. No gross focal neurological deficits. Sensation intact. Reflexes intact.  SKIN: No ulceration, lesions, rashes, or cyanosis. Skin warm and dry. Turgor intact.  PSYCHIATRIC: Mood, affect within normal limits. The patient is awake, alert and oriented x 3. Insight, judgment intact.   LABORATORY PANEL:   CBC  Recent Labs Lab 12/09/15 0500  WBC 11.3*  HGB 10.7*  HCT 31.9*  PLT 287   ------------------------------------------------------------------------------------------------------------------  Chemistries   Recent Labs Lab 12/08/15 1940 12/09/15 0500  NA 139 139  K 3.6 4.0  CL 105 105  CO2 23 24  GLUCOSE 199* 258*  BUN 10 13  CREATININE 0.63 0.69  CALCIUM 9.1 8.5*  MG 1.6*  --   AST 19  --  ALT 23  --   ALKPHOS 201*  --   BILITOT 0.8  --    ------------------------------------------------------------------------------------------------------------------  Cardiac Enzymes No results for input(s):  TROPONINI in the last 168 hours. ------------------------------------------------------------------------------------------------------------------  RADIOLOGY:  Ct Head Wo Contrast  Result Date: 12/08/2015 CLINICAL DATA:  "Not feeling well" in a patient with Brain cancer. Records in epic care everywhere document a previous CT scan performed at [place] on 10/26/2015 for history of adenocarcinoma and enhancing dural-based lesion in the right parietal lobe. EXAM: CT HEAD WITHOUT CONTRAST TECHNIQUE: Contiguous axial images were obtained from the base of the skull through the vertex without intravenous contrast. COMPARISON:  None. FINDINGS: Brain: High right parietal vasogenic edema is noted with a probable peripheral 10 mm hyper attenuating lesion (image 26 series 2). Overlying craniotomy defect is evident. 8 mm hypo attenuating lesion is identified in the right thalamus, not described previously. Wedge-shaped area of hypoattenuation identified in the inferior right cerebellar hemisphere. On imaging today, this has an appearnace of encephalomalacia related infarct, but could certainly represent vasogenic edema from mass lesion. This finding was not described up on the CT report from [place] about 1 month ago, suggesting that it is a new interval finding. No evidence for midline shift. No hydrocephalus. No CT findings of acute hemorrhage. Vascular: Atherosclerotic calcification is visualized in the carotid arteries. No dense MCA sign. Major dural sinuses are unremarkable. Skull: High right parietal craniotomy defect noted. Sinuses/Orbits: The visualized paranasal sinuses and mastoid air cells are clear. Visualized portions of the globes and intraorbital fat are unremarkable. Other: N/A IMPRESSION: 1. Chronic right parietal vasogenic edema with probable 10 mm hyper attenuating lesion towards the vertex. Overlying craniotomy defect suggest previous surgery in this region  and vasogenic edema was described in this area on previous outside CT report from [place] dated 10/26/2015. 2. 8 mm hypo attenuating lesion right down was not described previously. Age indeterminate lacunar infarction could have this appearance. MRI without and with contrast may prove helpful to further evaluate. 3. Peripheral wedge-shaped area of apparent encephalomalacia in the inferior right cerebellar hemisphere from so not described previously. This could represent an area of post ischemic encephalomalacia but given the history of metastatic disease to the brain, vasogenic edema from metastatic deposit cannot be entirely excluded. This could also be better assessed by MRI. 4. No hydrocephalus or midline shift. No evidence for acute hemorrhage. Electronically Signed   By: Misty Stanley M.D.   On: 12/08/2015 20:07   Mr Jeri Cos OZ Contrast  Result Date: 12/10/2015 CLINICAL DATA:  61 year old female with intractable nausea vomiting. Lung cancer with metastatic disease. Previous vertex craniotomy with underlying vasogenic edema on noncontrast head CT 2 days ago. Initial encounter. EXAM: MRI HEAD WITHOUT AND WITH CONTRAST TECHNIQUE: Multiplanar, multiecho pulse sequences of the brain and surrounding structures were obtained without and with intravenous contrast. CONTRAST:  82m MULTIHANCE GADOBENATE DIMEGLUMINE 529 MG/ML IV SOLN COMPARISON:  Head CT without contrast 12/08/2015. Records in epic care everywhere document a [place] CT [facility] [place] [facility] for history of adenocarcinoma and enhancing dural-based lesion in the right parietal lobe. FINDINGS: Sequelae of right vertex craniotomy. Widespread pachymeningeal thickening and enhancement over the superior brain (series 11, image 16). Resection cavity at the superior right parietal lobe with overlying slightly more thick and irregular dural or meningeal enhancement (series 12, image 12 and series 11,  image 23. Subjacent T2 and FLAIR hyperintensity in  the right parietal and posterior right frontal lobes with mild gyral expansion and regional mass effect (series 6, image 20). Superimposed small 5 mm focus of white matter restricted diffusion a mix the area of vasogenic edema as seen on series 100 and series 101, image 41. Furthermore, there is confluent 3 cm area of trace diffusion abnormality in the inferior right cerebellum. Isointense to slightly restricted on ADC. Associated T2 and FLAIR hyperintensity without hemorrhage or mass effect. There is T2 and FLAIR hyperintensity in the medial right thalamus with increased trace diffusion signal but mild if any diffusion restriction. There are small foci ranging from punctate to 5 mm in the left occipital and parietal lobes with possible restricted diffusion. Mild if any associated T2 and FLAIR signal changes in those areas. No acute intracranial hemorrhage identified. No enhancement at any of these areas. Major intracranial vascular flow voids are preserved. Confluent T2 hyperintensity in the pons and scattered small areas of nonspecific white matter T2 and FLAIR hyperintensity in addition to those described above are noted. No chronic cerebral blood products except at the right parietal resection cavity. No ventriculomegaly. Patent basilar cisterns. Negative pituitary, cervicomedullary junction and visualized cervical spine. Visualized bone marrow signal is within normal limits. Small volume postoperative extra-axial collection overlying the resection cavity. Visible internal auditory structures appear normal. Trace right mastoid effusion. Paranasal sinuses are clear. No acute orbit or scalp soft tissue findings. IMPRESSION: 1. Early subacute appearing - mostly lacunar type - infarcts in the right PICA territory, scattered both PCA territories, and also in the right MCA/PCA territory (in the area of vasogenic edema described in #2). No associated hemorrhage or  infarct related mass effect. 2. Right parietal craniotomy and resection cavity. Mildly irregular dural/meningeal thickening at the resection site with regional vasogenic edema and mild mass effect. This appearance is suspicious for metastatic disease recurrence, and comparison with previous brain MRIs is recommended. 3. No other metastatic disease to the brain suspected; widespread smooth dural thickening over both hemispheres is favored to be postoperative in nature. Electronically Signed   By: Genevie Ann M.D.   On: 12/10/2015 10:46   Dg Hip Unilat With Pelvis 2-3 Views Left  Result Date: 12/10/2015 CLINICAL DATA:  60 year old female with acute onset bilateral hip pain with no known injury. Initial encounter. Prior right hip arthroplasty. EXAM: DG HIP (WITH OR WITHOUT PELVIS) 2-3V LEFT COMPARISON:  Right hip series from today reported separately. CT Abdomen and Pelvis 12/02/2015. FINDINGS: Pelvis appears intact. Previous lumbar spine surgery. Right hip arthroplasty changes. Moderate left hip joint space loss with subchondral sclerosis and osteophytosis of the acetabulum. Proximal left femur intact. No acute osseous abnormality identified. IMPRESSION: 1. Moderate left hip osteoarthritis. No acute osseous abnormality identified. 2. Right hip series today reported separately. Electronically Signed   By: Genevie Ann M.D.   On: 12/10/2015 10:55   Dg Hip Unilat With Pelvis 2-3 Views Right  Result Date: 12/10/2015 CLINICAL DATA:  60 year old female with acute onset bilateral hip pain with no known injury. Initial encounter. Prior right hip arthroplasty. EXAM: DG HIP (WITH OR WITHOUT PELVIS) 2-3V RIGHT COMPARISON:  CT Abdomen and Pelvis 12/02/2015. Right hip series 01/24/12. FINDINGS: Lower lumbar spine postoperative changes also noted. Right hip arthroplasty hardware appears stable and intact. Pelvis intact. SI joints within normal limits. Moderate left hip joint space loss with acetabular subchondral sclerosis and  osteophytosis. No acute osseous abnormality identified. Negative visible bowel gas pattern. IMPRESSION: 1. No acute osseous abnormality identified. About the right  hip per pelvis. 2. Right hip arthroplasty hardware appears intact and normally aligned. 3. Left hip reported separately. Electronically Signed   By: Genevie Ann M.D.   On: 12/10/2015 10:54    EKG:   Orders placed or performed during the hospital encounter of 12/08/15  . ED EKG  . ED EKG    ASSESSMENT AND PLAN:   Marie [name] is a 60 y.o. female presenting with Weakness . Admitted 12/08/2015 : Day #: 0 days   * subacute lacunar  infarcts in the right PICA territory,PCA territories, and also in the right MCA/PCA territory MRI results discussed with patient ASA, Statin Check Carotid dopplers Recent Echo showed nothing acute.  * Lung cancer with Brain metastases Decadron Radiation later today Discussed with oncology and palliative care  * Type 2 diabetes: Restart home medications, add Lantus, increase sliding coverage-patient will need higher doses given steroids  * DVT prophylaxis Lovenox   All the records are reviewed and case discussed with Care Management/Social Workerr. Management plans discussed with the patient, family and they are in agreement.  CODE STATUS: full TOTAL TIME TAKING CARE OF THIS PATIENT: 35 minutes.   POSSIBLE D/C IN 1-2DAYS, DEPENDING ON CLINICAL CONDITION.   Hillary Bow R M.D on 12/10/2015 at 1:18 PM  Between 7am to 6pm - Pager - [phone]  After 6pm: House Pager: - [facility]  Office  [phone]  CC: Primary care physician; Cletis Athens, MD

## 2015-12-10 NOTE — Progress Notes (Signed)
Inpatient Diabetes Program Recommendations  AACE/ADA: New Consensus Statement on Inpatient Glycemic Control (2015)  Target Ranges:  Prepandial:   less than 140 mg/dL      Peak postprandial:   less than 180 mg/dL (1-2 hours)      Critically ill patients:  140 - 180 mg/dL   Lab Results  Component Value Date   GLUCAP 362 (H) 12/10/2015   HGBA1C 11.4 (H) 12/03/2015    Results for Marie [name], Marie [name] (MRN [id number]) as of 12/10/2015 10:36  Ref. Range 12/09/2015 07:50 12/09/2015 12:07 12/09/2015 17:09 12/09/2015 21:27 12/10/2015 08:02  Glucose-Capillary Latest Ref Range: 65 - 99 mg/dL 298 (H) 286 (H) 288 (H) 328 (H) 362 (H)    Diabetes history: Type 2 diabetes Outpatient Diabetes medications: Toujeo 50 units daily, Glucotrol 5 mg bid, Metformin 500 mg bid  Current orders for Inpatient glycemic control: Lantus 30 units qhs, Novolog moderate tid, novolog  HS-Decadron 8 mg q 6 hours  Inpatient Diabetes Program Recommendations:  Please consider increasing Novolog correction to resistant correction (0-20 units) tid since patient is on steroids and CBG remain elevated.   Consider increasing Lantus to 40 units qhs.  Gentry Fitz, RN, BA, MHA, CDE Diabetes Coordinator Inpatient Diabetes Program  [phone] (Team Pager) [phone] ([place]) 12/10/2015 10:48 AM

## 2015-12-10 NOTE — Progress Notes (Signed)
[place] CONSULT NOTE  Patient Care Team: Cletis Athens, MD as PCP - General (Unknown Physician Specialty)  CHIEF COMPLAINTS/PURPOSE OF CONSULTATION:  Metastatic lung cancer to the brain  HISTORY OF PRESENTING ILLNESS:  Marie [name] 60 y.o.  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   Patient returns back to the hospital for- increasing pain in her right hip/and also difficulty with ambulation. She also had mental status changes. Patient had a CT scan of the brain on contrast showed edema around the surgical cavity/and also possible new lesions. Patient is currently on steroids; insulin.   She continues to feel poorly. Noticed no significant improvement of her strength in the left lower extremity.   Appetite is poor. Positive for nausea no vomiting. She has been ambulating only with a wheelchair. No seizures. No shortness of breath or chest pain. No cough.  ROS: A complete 10 point review of system is done which is negative except mentioned above in history of present illness  MEDICAL HISTORY:  Past Medical History:  Diagnosis Date  . Arthritis   . Cancer ([place])    Lung CA right wedge removed  . CHF (congestive heart failure) ([place])   . COPD (chronic obstructive pulmonary disease) ([facility])    has oxygen but doesn't use  . Diabetes mellitus type 2 with neurological manifestations ([place])    Peripheral neuropathy and foot ulcers  . Hypertension   . PAD (peripheral artery disease) ([place])   . Peripheral neuropathy ([place])   . Shortness of breath dyspnea     SURGICAL HISTORY: Past Surgical History:  Procedure Laterality Date  . ABDOMINAL HYSTERECTOMY    . BACK SURGERY  x 7  . CARPAL TUNNEL RELEASE Bilateral   . CESAREAN SECTION     x3  . CORONARY ANGIOPLASTY     no stents  . FOOT SURGERY Left    diabetic ulcer  . KNEE  ARTHROSCOPY Bilateral   . TONSILLECTOMY    . VIDEO ASSISTED THORACOSCOPY (VATS)/THOROCOTOMY Right 08/26/2014   Procedure: VIDEO ASSISTED THORACOSCOPY (VATS)/THOROCOTOMY;  Surgeon: Nestor Lewandowsky, MD;  Location: [facility] ORS;  Service: General;  Laterality: Right;    SOCIAL HISTORY: Social History   Social History  . Marital status: Widowed    Spouse name: N/A  . Number of children: N/A  . Years of education: N/A   Occupational History  . Not on file.   Social History Main Topics  . Smoking status: Current Every Day Smoker    Packs/day: 0.50    Years: 46.00    Types: Cigarettes  . Smokeless tobacco: Never Used  . Alcohol use No  . Drug use: No  . Sexual activity: Not on file   Other Topics Concern  . Not on file   Social History Narrative  . No narrative on file    FAMILY HISTORY: Family History  Problem Relation Age of Onset  . Hypertension Brother   . Coronary artery disease Mother   . Diabetes Mellitus II Mother   . Coronary artery disease Father   . Diabetes Mellitus II Father   . Diabetes Mellitus II Brother     ALLERGIES:  is allergic to ampicillin; penicillins; avelox [moxifloxacin hcl in nacl]; etodolac; and neurontin [gabapentin].  MEDICATIONS:  Current Facility-Administered Medications  Medication Dose Route Frequency Provider Last Rate Last Dose  . acetaminophen (TYLENOL) tablet 650 mg  650  mg Oral Q6H PRN Lance Coon, MD       Or  . acetaminophen (TYLENOL) suppository 650 mg  650 mg Rectal Q6H PRN Lance Coon, MD      . ALPRAZolam Duanne Moron) tablet 0.25 mg  0.25 mg Oral BID Lance Coon, MD   0.25 mg at 12/09/15 2037  . dexamethasone (DECADRON) tablet 8 mg  8 mg Oral Q6H Nena Polio, MD   8 mg at 12/09/15 2308  . DULoxetine (CYMBALTA) DR capsule 60 mg  60 mg Oral Daily Lance Coon, MD   60 mg at 12/09/15 5176  . enoxaparin (LOVENOX) injection 40 mg  40 mg Subcutaneous QHS Lance Coon, MD   40 mg at 12/09/15 2038  . feeding supplement (ENSURE ENLIVE)  (ENSURE ENLIVE) liquid 237 mL  237 mL Oral BID BM Lytle Butte, MD   237 mL at 12/09/15 1400  . HYDROmorphone (DILAUDID) injection 0.5 mg  0.5 mg Intravenous Q4H PRN Lance Coon, MD   0.5 mg at 12/10/15 0715  . insulin aspart (novoLOG) injection 0-15 Units  0-15 Units Subcutaneous TID WC Lytle Butte, MD   8 Units at 12/09/15 1756  . insulin aspart (novoLOG) injection 0-5 Units  0-5 Units Subcutaneous QHS Lance Coon, MD   4 Units at 12/09/15 2201  . insulin glargine (LANTUS) injection 30 Units  30 Units Subcutaneous QHS Lytle Butte, MD   30 Units at 12/09/15 2201  . levETIRAcetam (KEPPRA) tablet 500 mg  500 mg Oral BID Lance Coon, MD   500 mg at 12/09/15 2037  . mometasone-formoterol (DULERA) 200-5 MCG/ACT inhaler 2 puff  2 puff Inhalation BID Lance Coon, MD   2 puff at 12/09/15 2038  . ondansetron (ZOFRAN) tablet 4 mg  4 mg Oral Q6H PRN Lance Coon, MD   4 mg at 12/10/15 0131   Or  . ondansetron [facility]) injection 4 mg  4 mg Intravenous Q6H PRN Lance Coon, MD      . oxyCODONE (Oxy IR/ROXICODONE) immediate release tablet 5 mg  5 mg Oral Q4H PRN Lance Coon, MD   5 mg at 12/10/15 1607  . pantoprazole (PROTONIX) EC tablet 40 mg  40 mg Oral QAC breakfast Lance Coon, MD   40 mg at 12/09/15 3710  . pregabalin (LYRICA) capsule 150 mg  150 mg Oral BID Lance Coon, MD   150 mg at 12/09/15 2037  . rosuvastatin (CRESTOR) tablet 10 mg  10 mg Oral Daily Lance Coon, MD   10 mg at 12/09/15 6269  . sodium chloride flush (NS) 0.9 % injection 3 mL  3 mL Intravenous Q12H Lance Coon, MD   3 mL at 12/09/15 2038      .  PHYSICAL EXAMINATION:  Vitals:   12/09/15 2022 12/10/15 0529  BP: (!) 148/83 (!) 160/96  Pulse: 96 92  Resp: (!) 24   Temp: 99.1 F (37.3 C) 97.4 F (36.3 C)   Filed Weights   12/08/15 1905  Weight: 198 lb (89.8 kg)    GENERAL: Well-nourished well-developed; Alert, no distress and comfortable.  Alone. EYES: no pallor or icterus OROPHARYNX: no thrush or  ulceration. NECK: supple, no masses felt LYMPH:  no palpable lymphadenopathy in the cervical, axillary or inguinal regions LUNGS: decreased breath sounds to auscultation at bases and  No wheeze or crackles HEART/CVS: regular rate & rhythm and no murmurs; No lower extremity edema ABDOMEN: abdomen soft, non-tender and normal bowel sounds Musculoskeletal:no cyanosis of digits and no clubbing; difficulty in  lifting the right leg because of pain. PSYCH: alert & oriented x 3 with fluent speech NEURO: no focal motor/sensory deficits SKIN:  no rashes or significant lesions  LABORATORY DATA:  I have reviewed the data as listed Lab Results  Component Value Date   WBC 11.3 (H) 12/09/2015   HGB 10.7 (L) 12/09/2015   HCT 31.9 (L) 12/09/2015   MCV 89.7 12/09/2015   PLT 287 12/09/2015    Recent Labs  12/02/15 2126  12/04/15 0600 12/05/15 0618  12/07/15 0736 12/08/15 1940 12/09/15 0500  NA 135  < > 139 137  --   --  139 139  K 3.8  < > 3.5 3.3*  < > 3.9 3.6 4.0  CL 102  < > 107 105  --   --  105 105  CO2 23  < > 24 23  --   --  23 24  GLUCOSE 180*  < > 150* 205*  --   --  199* 258*  BUN 23*  < > 10 8  --   --  10 13  CREATININE 1.22*  < > 0.56 0.63  --   --  0.63 0.69  CALCIUM 8.4*  < > 8.2* 8.4*  --   --  9.1 8.5*  GFRNONAA 48*  < > >60 >60  --   --  >60 >60  GFRAA 55*  < > >60 >60  --   --  >60 >60  PROT 7.0  --  6.5  --   --   --  6.9  --   ALBUMIN 2.5*  --  2.1*  --   --   --  2.4*  --   AST 22  --  20  --   --   --  19  --   ALT 19  --  19  --   --   --  23  --   ALKPHOS 155*  --  160*  --   --   --  201*  --   BILITOT 0.5  --  0.2*  --   --   --  0.8  --   < > = values in this interval not displayed.  RADIOGRAPHIC STUDIES: I have personally reviewed the radiological images as listed and agreed with the findings in the report. Dg Chest 1 View  Result Date: 12/02/2015 CLINICAL DATA:  Code sepsis.  Lung cancer and altered mental status EXAM: CHEST 1 VIEW COMPARISON:  06/05/2015  FINDINGS: Chronic hazy appearance at the right base where there are lung sutures. Scar in this region on 2016 chest CT. Mild increase in left basilar markings which was also seen previously. No cardiomegaly for technique. Negative aortic and hilar contours. IMPRESSION: Stable compared to prior, including postoperative changes in the right lower lobe. No convincing pneumonia. Electronically Signed   By: Monte Fantasia M.D.   On: 12/02/2015 21:49   Ct Head Wo Contrast  Result Date: 12/08/2015 CLINICAL DATA:  "Not feeling well" in a patient with Brain cancer. Records in epic care everywhere document a previous CT scan performed at [facility] on 10/26/2015 for history of adenocarcinoma and enhancing dural-based lesion in the right parietal lobe. EXAM: CT HEAD WITHOUT CONTRAST TECHNIQUE: Contiguous axial images were obtained from the base of the skull through the vertex without intravenous contrast. COMPARISON:  None. FINDINGS: Brain: High right parietal vasogenic edema is noted with a probable peripheral 10 mm hyper attenuating lesion (image 26 series  2). Overlying craniotomy defect is evident. 8 mm hypo attenuating lesion is identified in the right thalamus, not described previously. Wedge-shaped area of hypoattenuation identified in the inferior right cerebellar hemisphere. On imaging today, this has an appearnace of encephalomalacia related infarct, but could certainly represent vasogenic edema from mass lesion. This finding was not described up on the CT report from [place] about 1 month ago, suggesting that it is a new interval finding. No evidence for midline shift. No hydrocephalus. No CT findings of acute hemorrhage. Vascular: Atherosclerotic calcification is visualized in the carotid arteries. No dense MCA sign. Major dural sinuses are unremarkable. Skull: High right parietal craniotomy defect noted. Sinuses/Orbits: The visualized paranasal sinuses and mastoid air  cells are clear. Visualized portions of the globes and intraorbital fat are unremarkable. Other: N/A IMPRESSION: 1. Chronic right parietal vasogenic edema with probable 10 mm hyper attenuating lesion towards the vertex. Overlying craniotomy defect suggest previous surgery in this region and vasogenic edema was described in this area on previous outside CT report from [facility] dated 10/26/2015. 2. 8 mm hypo attenuating lesion right down was not described previously. Age indeterminate lacunar infarction could have this appearance. MRI without and with contrast may prove helpful to further evaluate. 3. Peripheral wedge-shaped area of apparent encephalomalacia in the inferior right cerebellar hemisphere from so not described previously. This could represent an area of post ischemic encephalomalacia but given the history of metastatic disease to the brain, vasogenic edema from metastatic deposit cannot be entirely excluded. This could also be better assessed by MRI. 4. No hydrocephalus or midline shift. No evidence for acute hemorrhage. Electronically Signed   By: Misty Stanley M.D.   On: 12/08/2015 20:07   Ct Abdomen Pelvis W Contrast  Result Date: 12/03/2015 CLINICAL DATA:  Severe sepsis. Right lower quadrant abdominal pain. Lung cancer. EXAM: CT ABDOMEN AND PELVIS WITH CONTRAST TECHNIQUE: Multidetector CT imaging of the abdomen and pelvis was performed using the standard protocol following bolus administration of intravenous contrast. CONTRAST:  171m ISOVUE-300 IOPAMIDOL (ISOVUE-300) INJECTION 61% COMPARISON:  Head CT 07/09/2014.  Chest CT 09/25/2014 FINDINGS: Lower chest and abdominal wall: Partly seen ill-defined airspace opacity in the right middle lobe. There are new pulmonary nodules in the bibasilar lungs, largest in the right posterior costophrenic sulcus measuring 12 mm. Per report in care everywhere 10/23/2015 these are new/progressed. Septal thickening consistent with edema.  Hepatobiliary: Numerous low-density liver masses that are new compared to chest CT comparison. These measure up to 3 cm in size in multiple segments. Although low-density they are likely solid. Multi focal abscess considered unlikely.Cholecystectomy. Normal common bile duct diameter. Pancreas: Unremarkable. Spleen: Unremarkable. Adrenals/Urinary Tract: Negative adrenals. Patchy heterogeneous hypo enhancement in the kidneys having ill-defined masslike appearance. Pyelonephritis is considered given the clinical circumstances, but metastases are favored. Unremarkable bladder. Stomach/Bowel:  No obstruction. No appendicitis. Reproductive:Hysterectomy. Possible right oophorectomy. Negative left ovary. Vascular/Lymphatic: No acute vascular abnormality. Mild enlargement of lymph nodes in the deep liver drainage. Other: No ascites or pneumoperitoneum. Musculoskeletal: No acute or aggressive finding. L3-4 and L4-5 discectomy with solid bony fusion. IMPRESSION: 1. Partly seen right middle lobe pneumonia. Nodules in the bilateral lungs are primarily concerning for metastatic foci, but hematogenous infection is not excluded as these have rapidly developed compared to 10/23/2015 chest CT report. 2. Pulmonary edema. 3. Abnormal bilateral renal perfusion which could be pyelonephritis or metastases. Correlate with urinalysis and follow-up. 4. Numerous hepatic metastases. Electronically Signed   By: JMonte Fantasia  M.D.   On: 12/03/2015 00:23   Dg Chest Portable 1 View  Result Date: 12/02/2015 CLINICAL DATA:  Central line placement EXAM: PORTABLE CHEST 1 VIEW COMPARISON:  Earlier today FINDINGS: New right IJ central line with tip at the SVC level. No pneumothorax or new mediastinal widening. New bilateral interstitial prominence suggesting edema. Normal heart size and stable mediastinal contours. Postoperative changes in the right lower lobe are becoming obscured. IMPRESSION: 1. New central line without adverse finding. 2. New  pulmonary edema. Electronically Signed   By: Monte Fantasia M.D.   On: 12/02/2015 23:37   Dg Foot Complete Left  Result Date: 11/24/2015 CLINICAL DATA:  Open wound on left foot, initial encounter EXAM: LEFT FOOT - COMPLETE 3+ VIEW COMPARISON:  10/31/2014 FINDINGS: Changes in the left second toe which have progressed somewhat in the interval from the prior exam particularly in the proximal phalanx. Soft tissue wound is again noted. No frank bony destruction to suggest osteomyelitis is seen. Clinical correlation is recommended. IMPRESSION: Some mild progressive loss at the base of the second proximal phalanx. Associated soft tissue wound is noted although no definitive bony destruction is seen. Electronically Signed   By: Inez Catalina M.D.   On: 11/24/2015 16:20    ASSESSMENT & PLAN:   # 60 year old with multiple medical problems- with newly diagnosed adenocarcinoma the lung to the brain currently admitted to the hospital for worsening pain in her hips/mental status changes.  # Metastatic adenocarcinoma lung- liver/brain. Patient currently on whole brain radiation. Reviewed the noncontrast CT scan- question progression. Recommend MRI of the brain with and without contrast for further evaluation.   # Hip pain- recommend hip x-rays bilateral for further evaluation.  # Poorly controlled diabetes/on dexamethasone 4 brain metastases- currently on insulin.  # Discussed with the patient at length that she has incurable cancer/ and with a declining performance status/multiple comorbidities- systemic treatment would be difficult to offer.  # Also recommend palliative care consultation/for goals of care. Discussed with Mary/palliative care service.  Informed Dr.Sudini of the above plan.  Thank you Dr. Darvin Neighbours for allowing me to participate in the care of your pleasant patient. Please do not hesitate to contact me with questions or concerns in the interim.  All questions were answered. The patient knows  to call the clinic with any problems, questions or concerns.    Cammie Sickle, MD 12/10/2015 7:41 AM

## 2015-12-10 NOTE — Progress Notes (Signed)
2100 Finger stick CBG is 497.  Placed order for blood glucose stat which was drawn at 2110.  Received result at 2150 of 448 and spoke with Dr Ara Kussmaul.  Order given for 6 units of Novolog.  Pt has already received Lantus.  Pt perspiring but no other symptoms. Dorna Bloom RN

## 2015-12-11 ENCOUNTER — Ambulatory Visit
Admission: RE | Admit: 2015-12-11 | Discharge: 2015-12-11 | Disposition: A | Discharge status: Home / Self Care | Payer: Medicare Other | Source: Ambulatory Visit | Attending: Radiation Oncology | Admitting: Radiation Oncology

## 2015-12-11 ENCOUNTER — Ambulatory Visit: Payer: Medicare Other

## 2015-12-11 DIAGNOSIS — Z90710 Acquired absence of both cervix and uterus: Secondary | ICD-10-CM | POA: Diagnosis not present

## 2015-12-11 DIAGNOSIS — Z79899 Other long term (current) drug therapy: Secondary | ICD-10-CM | POA: Diagnosis not present

## 2015-12-11 DIAGNOSIS — E1149 Type 2 diabetes mellitus with other diabetic neurological complication: Secondary | ICD-10-CM | POA: Diagnosis present

## 2015-12-11 DIAGNOSIS — E1142 Type 2 diabetes mellitus with diabetic polyneuropathy: Secondary | ICD-10-CM | POA: Diagnosis present

## 2015-12-11 DIAGNOSIS — I639 Cerebral infarction, unspecified: Principal | ICD-10-CM | POA: Diagnosis present

## 2015-12-11 DIAGNOSIS — C787 Secondary malignant neoplasm of liver and intrahepatic bile duct: Secondary | ICD-10-CM | POA: Diagnosis present

## 2015-12-11 DIAGNOSIS — Z833 Family history of diabetes mellitus: Secondary | ICD-10-CM | POA: Diagnosis not present

## 2015-12-11 DIAGNOSIS — K5900 Constipation, unspecified: Secondary | ICD-10-CM

## 2015-12-11 DIAGNOSIS — Z9861 Coronary angioplasty status: Secondary | ICD-10-CM | POA: Diagnosis not present

## 2015-12-11 DIAGNOSIS — M1990 Unspecified osteoarthritis, unspecified site: Secondary | ICD-10-CM | POA: Diagnosis present

## 2015-12-11 DIAGNOSIS — C3490 Malignant neoplasm of unspecified part of unspecified bronchus or lung: Secondary | ICD-10-CM | POA: Diagnosis present

## 2015-12-11 DIAGNOSIS — C3411 Malignant neoplasm of upper lobe, right bronchus or lung: Secondary | ICD-10-CM | POA: Diagnosis not present

## 2015-12-11 DIAGNOSIS — J449 Chronic obstructive pulmonary disease, unspecified: Secondary | ICD-10-CM | POA: Diagnosis present

## 2015-12-11 DIAGNOSIS — I739 Peripheral vascular disease, unspecified: Secondary | ICD-10-CM | POA: Diagnosis present

## 2015-12-11 DIAGNOSIS — I509 Heart failure, unspecified: Secondary | ICD-10-CM | POA: Diagnosis present

## 2015-12-11 DIAGNOSIS — Z9889 Other specified postprocedural states: Secondary | ICD-10-CM | POA: Diagnosis not present

## 2015-12-11 DIAGNOSIS — R4182 Altered mental status, unspecified: Secondary | ICD-10-CM | POA: Diagnosis not present

## 2015-12-11 DIAGNOSIS — Z794 Long term (current) use of insulin: Secondary | ICD-10-CM | POA: Diagnosis not present

## 2015-12-11 DIAGNOSIS — Z880 Allergy status to penicillin: Secondary | ICD-10-CM | POA: Diagnosis not present

## 2015-12-11 DIAGNOSIS — I110 Hypertensive heart disease with heart failure: Secondary | ICD-10-CM | POA: Diagnosis present

## 2015-12-11 DIAGNOSIS — C7931 Secondary malignant neoplasm of brain: Secondary | ICD-10-CM | POA: Diagnosis present

## 2015-12-11 DIAGNOSIS — F17210 Nicotine dependence, cigarettes, uncomplicated: Secondary | ICD-10-CM | POA: Diagnosis present

## 2015-12-11 DIAGNOSIS — G9340 Encephalopathy, unspecified: Secondary | ICD-10-CM | POA: Diagnosis present

## 2015-12-11 DIAGNOSIS — E1165 Type 2 diabetes mellitus with hyperglycemia: Secondary | ICD-10-CM | POA: Diagnosis present

## 2015-12-11 DIAGNOSIS — Z7984 Long term (current) use of oral hypoglycemic drugs: Secondary | ICD-10-CM | POA: Diagnosis not present

## 2015-12-11 DIAGNOSIS — Z888 Allergy status to other drugs, medicaments and biological substances status: Secondary | ICD-10-CM | POA: Diagnosis not present

## 2015-12-11 DIAGNOSIS — Z8249 Family history of ischemic heart disease and other diseases of the circulatory system: Secondary | ICD-10-CM | POA: Diagnosis not present

## 2015-12-11 DIAGNOSIS — R531 Weakness: Secondary | ICD-10-CM | POA: Diagnosis present

## 2015-12-11 LAB — GLUCOSE, CAPILLARY
GLUCOSE-CAPILLARY: 374 mg/dL — AB (ref 65–99)
Glucose-Capillary: 339 mg/dL — ABNORMAL HIGH (ref 65–99)
Glucose-Capillary: 356 mg/dL — ABNORMAL HIGH (ref 65–99)
Glucose-Capillary: 281 mg/dL — ABNORMAL HIGH (ref 65–99)

## 2015-12-11 MED ORDER — POLYETHYLENE GLYCOL 3350 17 G PO PACK
17.0000 g | PACK | Freq: Every day | ORAL | Status: DC | PRN
  Administered 2015-12-11: 17 g via ORAL
  Filled 2015-12-11: qty 1

## 2015-12-11 MED ORDER — SENNOSIDES-DOCUSATE SODIUM 8.6-50 MG PO TABS
2.0000 | ORAL_TABLET | Freq: Two times a day (BID) | ORAL | Status: DC
  Administered 2015-12-11 – 2015-12-12 (×2): 2 via ORAL
  Filled 2015-12-11 (×2): qty 2

## 2015-12-11 MED ORDER — INSULIN ASPART 100 UNIT/ML ~~LOC~~ SOLN
5.0000 [IU] | Freq: Once | SUBCUTANEOUS | Status: AC
  Administered 2015-12-11: 01:00:00 5 [IU] via SUBCUTANEOUS

## 2015-12-11 MED ORDER — DEXAMETHASONE 4 MG PO TABS
4.0000 mg | ORAL_TABLET | Freq: Three times a day (TID) | ORAL | Status: DC
  Administered 2015-12-11 – 2015-12-12 (×3): 4 mg via ORAL
  Filled 2015-12-11 (×3): qty 1

## 2015-12-11 MED ORDER — INSULIN GLARGINE 100 UNIT/ML ~~LOC~~ SOLN
50.0000 [IU] | Freq: Every day | SUBCUTANEOUS | Status: DC
  Administered 2015-12-11: 50 [IU] via SUBCUTANEOUS
  Filled 2015-12-11 (×2): qty 0.5

## 2015-12-11 MED ORDER — METOCLOPRAMIDE HCL 10 MG PO TABS
5.0000 mg | ORAL_TABLET | Freq: Three times a day (TID) | ORAL | Status: DC
  Administered 2015-12-11 – 2015-12-12 (×3): 5 mg via ORAL
  Filled 2015-12-11 (×3): qty 1

## 2015-12-11 NOTE — Evaluation (Signed)
Physical Therapy Evaluation Patient Details Name: Marie [name] MRN: [id number] DOB: [birth year] Today's Date: 12/11/2015   History of Present Illness  Pt is a 60 y.o. female presenting to hospital 12/07/25 with weakness, poor appetite, and intractable nausea.  Pt admitted with new brain mets (from metastatic lung CA) with intractable N/V.  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  Pt currently on whole brain radiation.  Of note, pt with recent diagnosis of brain CA and recent admission for severe sepsis with hypotension (d/c 12/07/15).  PMH includes lung CA with R wedge removal, CHF, COPD, DM, htn, peripheral neuropathy, R THA, R parietal craniotomy and resection of cavity, AKI, UTI, B CTR, back surgery.  Clinical Impression  Prior to admission, pt was ambulating short distances in home with RW.  Pt lives with her boyfriend (who has bone CA) and son (who has autism) in 1 level home with 4 STE no railing.  Currently pt is SBA supine to/from sit, CGA to stand from bed, min assist to stand from toilet, and CGA to ambulate to bathroom and back with RW (pt did require some vc's to stay within RW and stay closer to RW in general).  Pt reports that although her boyfriend has bone CA he has been able to physically assist her as needed and both her son and boyfriend assist her with stairs into/out of home.  Pt reports having 24/7 assist.  Pt would benefit from skilled PT to address noted impairments and functional limitations.  Recommend pt discharge to home with 24/7 assist and HHPT when medically appropriate.  Pt would benefit from [facility] to decreased assist needs with toilet transfers and decrease amount of walking requiring to toilet (CM notified).    Follow Up Recommendations Home health PT;Supervision/Assistance - 24 hour    Equipment Recommendations   (pt already  owns RW)    Recommendations for Other Services       Precautions / Restrictions Precautions Precautions: Fall Precaution  Comments: L diabetic foot ulcer; mets Restrictions Weight Bearing Restrictions: No      Mobility  Bed Mobility Overal bed mobility: Needs Assistance Bed Mobility: Supine to Sit;Sit to Supine     Supine to sit: Supervision;HOB elevated Sit to supine: Supervision;HOB elevated   General bed mobility comments: mild increased effort but able to perform on her own  Transfers Overall transfer level: Needs assistance Equipment used: Rolling walker (2 wheeled) Transfers: Sit to/from [facility] Sit to Stand: Min guard;Min assist (CGA from bed; min assist from toilet) Stand pivot transfers: Min guard       General transfer comment: pt with increased difficulty standing from low toilet in bathroom requiring increased assist  Ambulation/Gait Ambulation/Gait assistance: Min guard Ambulation Distance (Feet):  (20 feet x2 (to bathroom and back)) Assistive device: Rolling walker (2 wheeled)   Gait velocity: decreased   General Gait Details: decreased B step length/foot clearance/heelstrike; pt requiring intermittent vc's to stay closer to RW and stay within RW with turns  Financial trader Rankin (Stroke Patients Only)       Balance Overall balance assessment: Needs assistance Sitting-balance support: No upper extremity supported;Feet supported Sitting balance-Leahy Scale: Good     Standing balance support: Bilateral upper extremity supported (on RW) Standing balance-Leahy Scale: Good (static standing and pulling briefs up/down for toileting)  Pertinent Vitals/Pain Pain Assessment: Faces Faces Pain Scale: Hurts little more (with activity) Pain Location: hips and low back Pain Descriptors / Indicators: Sore Pain Intervention(s): Limited activity within patient's tolerance;Monitored during session;Repositioned  Vitals stable and WFL on 3 L/min O2 via nasal cannula.    Home  Living Family/patient expects to be discharged to:: Private residence Living Arrangements: Spouse/significant other;Children (Pt's 18 y.o. son (who is autistic) and her boyfriend (who has bone cancer)) Available Help at Discharge: Family   Home Access: Stairs to enter Entrance Stairs-Rails: None Entrance Stairs-Number of Steps: 4 Home Layout: One level Home Equipment: Environmental consultant - 4 wheels;Cane - quad;Walker - 2 wheels;Shower seat;Wheelchair - manual (w/c is too big to fit through doorways of home)      Prior Function Level of Independence: Independent with assistive device(s)         Comments: Independent with household ambulation (room to room) with 4ww; boyfriend and son assists pt with stairs     Hand Dominance        Extremity/Trunk Assessment   Upper Extremity Assessment: Generalized weakness (At least 4/5 B shoulder flexion, elbow flexion/extension; good B hand grips)           Lower Extremity Assessment: RLE deficits/detail;LLE deficits/detail RLE Deficits / Details: at least 4/5 DF, knee flexion/extension, and hip flexion LLE Deficits / Details: at least 4/5 DF, knee flexion/extension, and hip flexion     Communication   Communication: No difficulties  Cognition Arousal/Alertness: Awake/alert (Oriented to person, place, situation, and time (although required increased time to figure out date/year on her own)) Behavior During Therapy: WFL for tasks assessed/performed Overall Cognitive Status: Within Functional Limits for tasks assessed                      General Comments General comments (skin integrity, edema, etc.): Pt reports able to walk on ulcers on L foot (bandage noted on foot).  Nursing cleared pt for participation in physical therapy.  Pt agreeable to PT session.    Exercises  Gait      Assessment/Plan    PT Assessment Patient needs continued PT services  PT Diagnosis Difficulty walking;Generalized weakness   PT Problem List Decreased  strength;Decreased activity tolerance;Decreased balance;Decreased mobility;Decreased knowledge of use of DME  PT Treatment Interventions DME instruction;Gait training;Stair training;Functional mobility training;Therapeutic activities;Therapeutic exercise;Balance training;Patient/family education   PT Goals (Current goals can be found in the Care Plan section) Acute Rehab PT Goals Patient Stated Goal: to return home PT Goal Formulation: With patient Time For Goal Achievement: 12/25/15 Potential to Achieve Goals: Fair    Frequency 7X/week   Barriers to discharge    (Pt reports having 24/7 assist for functional mobility and boyfriend is able to physically assist)    Co-evaluation               End of Session Equipment Utilized During Treatment: Gait belt;Oxygen (3 L/min via nasal cannula) Activity Tolerance: Patient limited by fatigue Patient left: in bed;with call bell/phone within reach;with bed alarm set Nurse Communication: Mobility status;Precautions         Time: 1440-1510 PT Time Calculation (min) (ACUTE ONLY): 30 min   Charges:   PT Evaluation $PT Eval Moderate Complexity: 1 Procedure PT Treatments $Therapeutic Activity: 8-22 mins   PT G CodesLeitha Bleak 2016/01/02, 3:43 PM Leitha Bleak, [place]

## 2015-12-11 NOTE — Consult Note (Addendum)
Referring Physician: Sudini    Chief Complaint: Difficulty with gait  HPI: Marie [name] is an 60 y.o. female with a history of brain metastasis who was recently discharged on 8/28.  Had mostly been in the bed during hospitalization but on returning home was unable to ambulate well.  Developed intractable nausea and vomiting and was readmitted.  Patient unclear when gait symptoms started.  Initial NIHSS of 5 performed after admission and diagnosis.    Date last known well: Unable to determine Time last known well: Unable to determine tPA Given: No: Unable to determine LKW  Past Medical History:  Diagnosis Date  . Arthritis   . Cancer ([place])    Lung CA right wedge removed  . CHF (congestive heart failure) ([place])   . COPD (chronic obstructive pulmonary disease) ([facility])    has oxygen but doesn't use  . Diabetes mellitus type 2 with neurological manifestations ([place])    Peripheral neuropathy and foot ulcers  . Hypertension   . PAD (peripheral artery disease) ([place])   . Peripheral neuropathy ([place])   . Shortness of breath dyspnea     Past Surgical History:  Procedure Laterality Date  . ABDOMINAL HYSTERECTOMY    . BACK SURGERY  x 7  . CARPAL TUNNEL RELEASE Bilateral   . CESAREAN SECTION     x3  . CORONARY ANGIOPLASTY     no stents  . FOOT SURGERY Left    diabetic ulcer  . KNEE ARTHROSCOPY Bilateral   . TONSILLECTOMY    . VIDEO ASSISTED THORACOSCOPY (VATS)/THOROCOTOMY Right 08/26/2014   Procedure: VIDEO ASSISTED THORACOSCOPY (VATS)/THOROCOTOMY;  Surgeon: Nestor Lewandowsky, MD;  Location: [facility] ORS;  Service: General;  Laterality: Right;    Family History  Problem Relation Age of Onset  . Hypertension Brother   . Coronary artery disease Mother   . Diabetes Mellitus II Mother   . Coronary artery disease Father   . Diabetes Mellitus II Father   . Diabetes Mellitus II Brother    Social History:  reports that she has been smoking Cigarettes.  She has a 23.00 pack-year smoking history. She  has never used smokeless tobacco. She reports that she does not drink alcohol or use drugs.  Allergies:  Allergies  Allergen Reactions  . Ampicillin Anaphylaxis  . Penicillins Anaphylaxis and Shortness Of Breath    Has patient had a PCN reaction causing immediate rash, facial/tongue/throat swelling, SOB or lightheadedness with hypotension: yes Has patient had a PCN reaction causing severe rash involving mucus membranes or skin necrosis: no Has patient had a PCN reaction that required hospitalization no Has patient had a PCN reaction occurring within the last 10 years: no If all of the above answers are "NO", then may proceed with Cephalosporin use.   . Avelox [Moxifloxacin Hcl In Nacl] Other (See Comments)    Reaction: MUSCLE CRAMPS  . Etodolac Rash  . Neurontin [Gabapentin] Anxiety    Medications:  I have reviewed the patient's current medications. Prior to Admission:  Prescriptions Prior to Admission  Medication Sig Dispense Refill Last Dose  . ALPRAZolam (XANAX) 0.25 MG tablet Take 1 tablet (0.25 mg total) by mouth 2 (two) times daily. 60 tablet 0 12/07/2015 at Unknown time  . budesonide-formoterol (SYMBICORT) 160-4.5 MCG/ACT inhaler Inhale 2 puffs into the lungs 2 (two) times daily. Reported on 05/20/2015   12/07/2015 at Unknown time  . cyclobenzaprine (FLEXERIL) 10 MG tablet Take 10 mg by mouth 3 (three) times daily.    12/07/2015 at Unknown time  .  dexamethasone (DECADRON) 4 MG tablet Take 1 tablet (4 mg total) by mouth daily. 30 tablet 0 12/07/2015 at Unknown time  . diazepam (VALIUM) 5 MG tablet Take 2 mg by mouth daily.    12/07/2015 at Unknown time  . diphenhydrAMINE (BENADRYL) 25 MG tablet Take 25 mg by mouth at bedtime.   12/07/2015 at Unknown time  . DULoxetine (CYMBALTA) 60 MG capsule Take 60 mg by mouth daily.   12/07/2015 at Unknown time  . gemfibrozil (LOPID) 600 MG tablet Take 600 mg by mouth 2 (two) times daily before a meal.   12/07/2015 at Unknown time  . glipiZIDE  (GLUCOTROL) 5 MG tablet Take 5 mg by mouth 2 (two) times daily before a meal.    12/07/2015 at Unknown time  . ibuprofen (ADVIL,MOTRIN) 100 MG tablet Take 100 mg by mouth every 6 (six) hours as needed for fever.   prn at prn  . Insulin Glargine (TOUJEO SOLOSTAR) 300 UNIT/ML SOPN Inject 50 Units into the skin daily.   12/07/2015 at Unknown time  . levETIRAcetam (KEPPRA) 500 MG tablet Take 1 tablet by mouth 2 (two) times daily.   12/07/2015 at Unknown time  . LYRICA 150 MG capsule Take 150 mg by mouth 2 (two) times daily.    12/07/2015 at Unknown time  . metFORMIN (GLUCOPHAGE) 1000 MG tablet Take 500 mg by mouth 2 (two) times daily with a meal.    12/07/2015 at Unknown time  . oxyCODONE (OXY IR/ROXICODONE) 5 MG immediate release tablet 1-2 tabs every 4 hours as needed for severe pain 100 tablet 0 prn at prn  . pantoprazole (PROTONIX) 40 MG tablet Take 40 mg by mouth daily.    12/07/2015 at Unknown time  . promethazine (PHENERGAN) 12.5 MG tablet Take 1 tablet (12.5 mg total) by mouth every 6 (six) hours as needed for nausea or vomiting. 30 tablet 4 prn at prn  . rosuvastatin (CRESTOR) 10 MG tablet Take 10 mg by mouth daily.   12/07/2015 at Unknown time  . senna-docusate (SENOKOT-S) 8.6-50 MG tablet Take 1 tablet by mouth daily as needed for mild constipation.    prn at prn  . sulfamethoxazole-trimethoprim (BACTRIM DS,SEPTRA DS) 800-160 MG tablet Take 1 tablet by mouth 2 (two) times daily. 6 tablet 0 12/07/2015 at Unknown time   Scheduled: . ALPRAZolam  0.25 mg Oral BID  . aspirin EC  81 mg Oral Daily  . dexamethasone  4 mg Oral Q8H  . DULoxetine  60 mg Oral Daily  . enoxaparin (LOVENOX) injection  40 mg Subcutaneous QHS  . feeding supplement (ENSURE ENLIVE)  237 mL Oral BID BM  . insulin aspart  0-15 Units Subcutaneous TID WC  . insulin aspart  0-5 Units Subcutaneous QHS  . insulin glargine  50 Units Subcutaneous QHS  . levETIRAcetam  500 mg Oral BID  . mometasone-formoterol  2 puff Inhalation BID  .  pantoprazole  40 mg Oral QAC breakfast  . pregabalin  150 mg Oral BID  . rosuvastatin  10 mg Oral Daily  . sodium chloride flush  3 mL Intravenous Q12H    ROS: History obtained from the patient  General ROS: negative for - chills, fatigue, fever, night sweats, weight gain or weight loss Psychological ROS: negative for - behavioral disorder, hallucinations, memory difficulties, mood swings or suicidal ideation Ophthalmic ROS: negative for - blurry vision, double vision, eye pain or loss of vision ENT ROS: negative for - epistaxis, nasal discharge, oral lesions, sore throat, tinnitus or  vertigo Allergy and Immunology ROS: negative for - hives or itchy/watery eyes Hematological and Lymphatic ROS: negative for - bleeding problems, bruising or swollen lymph nodes Endocrine ROS: negative for - galactorrhea, hair pattern changes, polydipsia/polyuria or temperature intolerance Respiratory ROS: negative for - cough, hemoptysis, shortness of breath or wheezing Cardiovascular ROS: negative for - chest pain, dyspnea on exertion, edema or irregular heartbeat Gastrointestinal ROS: nausea/vomiting Genito-Urinary ROS: negative for - dysuria, hematuria, incontinence or urinary frequency/urgency Musculoskeletal ROS: negative for - joint swelling or muscular weakness Neurological ROS: as noted in HPI, headaches Dermatological ROS: negative for rash and skin lesion changes  Physical Examination: Blood pressure (!) 159/84, pulse 79, temperature 97.7 F (36.5 C), temperature source Oral, resp. rate 18, height '5\' 5"'$  (1.651 m), weight 89.8 kg (198 lb), SpO2 92 %.  HEENT-  Normocephalic, no lesions, without obvious abnormality.  Normal external eye and conjunctiva.  Normal TM's bilaterally.  Normal auditory canals and external ears. Normal external nose, mucus membranes and septum.  Normal pharynx. Cardiovascular- S1, S2 normal, pulses palpable throughout   Lungs- chest clear, no wheezing, rales, normal  symmetric air entry Abdomen- soft, non-tender; bowel sounds normal; no masses,  no organomegaly Extremities- no edema Lymph-no adenopathy palpable Musculoskeletal-no joint tenderness, deformity or swelling Skin-warm and dry, no hyperpigmentation, vitiligo, or suspicious lesions  Neurological Examination Mental Status: Alert, oriented, thought content appropriate.  Speech fluent without evidence of aphasia.  Able to follow 3 step commands without difficulty. Cranial Nerves: II: Discs flat bilaterally; Visual fields grossly normal, pupils equal, round, reactive to light and accommodation III,IV, VI: ptosis not present, extra-ocular motions intact bilaterally V,VII: smile symmetric, facial light touch sensation decreased on the right VIII: hearing normal bilaterally IX,X: gag reflex present XI: bilateral shoulder shrug XII: midline tongue extension Motor: Right : Upper extremity   5/5    Left:     Upper extremity   5/5  Lower extremity   2/5     Lower extremity   3/5 Tone and bulk:normal tone throughout; no atrophy noted Sensory: Pinprick and light touch decreased on the right upper and lower extremities Deep Tendon Reflexes: 2+ in the upper extremities, 1+ at the knees and absent at the ankles Plantars: Right: mute   Left: mute Cerebellar: Normal finger-to-nose testing bilaterally.  Unable to perform heel-to-shin due to weakness Gait: not tested due to safety concerns     Laboratory Studies:  Basic Metabolic Panel:  Recent Labs Lab 12/05/15 0618 12/06/15 0435 12/07/15 0736 12/08/15 1940 12/09/15 0500 12/10/15 2110  NA 137  --   --  139 139  --   K 3.3* 3.2* 3.9 3.6 4.0  --   CL 105  --   --  105 105  --   CO2 23  --   --  23 24  --   GLUCOSE 205*  --   --  199* 258* 448*  BUN 8  --   --  10 13  --   CREATININE 0.63  --   --  0.63 0.69  --   CALCIUM 8.4*  --   --  9.1 8.5*  --   MG  --   --  1.7 1.6*  --   --     Liver Function Tests:  Recent Labs Lab  12/08/15 1940  AST 19  ALT 23  ALKPHOS 201*  BILITOT 0.8  PROT 6.9  ALBUMIN 2.4*   No results for input(s): LIPASE, AMYLASE in the last 168 hours. No  results for input(s): AMMONIA in the last 168 hours.  CBC:  Recent Labs Lab 12/05/15 0618 12/08/15 1940 12/09/15 0500  WBC 9.4 13.5* 11.3*  NEUTROABS  --  10.6*  --   HGB 9.9* 10.7* 10.7*  HCT 29.0* 32.2* 31.9*  MCV 88.1 87.9 89.7  PLT 239 321 287    Cardiac Enzymes: No results for input(s): CKTOTAL, CKMB, CKMBINDEX, TROPONINI in the last 168 hours.  BNP: Invalid input(s): POCBNP  CBG:  Recent Labs Lab 12/10/15 1145 12/10/15 1641 12/10/15 2100 12/11/15 0002 12/11/15 0737  GLUCAP 376* 320* 497* 374* 339*    Microbiology: Results for orders placed or performed during the hospital encounter of 12/02/15  Urine culture     Status: Abnormal   Collection Time: 12/02/15  9:26 PM  Result Value Ref Range Status   Specimen Description URINE, RANDOM  Final   Special Requests NONE  Final   Culture 80,000 COLONIES/mL ESCHERICHIA COLI (A)  Final   Report Status 12/05/2015 FINAL  Final   Organism ID, Bacteria ESCHERICHIA COLI (A)  Final      Susceptibility   Escherichia coli - MIC*    AMPICILLIN <=2 SENSITIVE Sensitive     CEFAZOLIN <=4 SENSITIVE Sensitive     CEFTRIAXONE <=1 SENSITIVE Sensitive     CIPROFLOXACIN <=0.25 SENSITIVE Sensitive     GENTAMICIN <=1 SENSITIVE Sensitive     IMIPENEM <=0.25 SENSITIVE Sensitive     NITROFURANTOIN 32 SENSITIVE Sensitive     TRIMETH/SULFA <=20 SENSITIVE Sensitive     AMPICILLIN/SULBACTAM <=2 SENSITIVE Sensitive     PIP/TAZO <=4 SENSITIVE Sensitive     Extended ESBL NEGATIVE Sensitive     * 80,000 COLONIES/mL ESCHERICHIA COLI  Blood Culture (routine x 2)     Status: None   Collection Time: 12/02/15 10:03 PM  Result Value Ref Range Status   Specimen Description BLOOD RIGHT FATTY CASTS  Final   Special Requests BOTTLES DRAWN AEROBIC AND ANAEROBIC [place]  Final   Culture NO GROWTH  5 DAYS  Final   Report Status 12/07/2015 FINAL  Final  Blood Culture (routine x 2)     Status: None   Collection Time: 12/02/15 10:04 PM  Result Value Ref Range Status   Specimen Description BLOOD LEFT FATTY CASTS  Final   Special Requests BOTTLES DRAWN AEROBIC AND ANAEROBIC 6CC  Final   Culture NO GROWTH 5 DAYS  Final   Report Status 12/07/2015 FINAL  Final  MRSA PCR Screening     Status: None   Collection Time: 12/03/15  9:16 AM  Result Value Ref Range Status   MRSA by PCR NEGATIVE NEGATIVE Final    Comment:        The GeneXpert MRSA Assay (FDA approved for NASAL specimens only), is one component of a comprehensive MRSA colonization surveillance program. It is not intended to diagnose MRSA infection nor to guide or monitor treatment for MRSA infections.     Coagulation Studies: No results for input(s): LABPROT, INR in the last 72 hours.  Urinalysis:  Recent Labs Lab 12/08/15 1920  COLORURINE YELLOW*  LABSPEC 1.012  PHURINE 6.0  GLUCOSEU 50*  HGBUR NEGATIVE  BILIRUBINUR NEGATIVE  KETONESUR 1+*  PROTEINUR 100*  NITRITE NEGATIVE  LEUKOCYTESUR NEGATIVE    Lipid Panel:    Component Value Date/Time   CHOL 302 (H) 07/06/2011 0539   TRIG 601 (H) 07/06/2011 0539   HDL 32 (L) 07/06/2011 0539   VLDL SEE COMMENT 07/06/2011 0539   LDLCALC SEE COMMENT 07/06/2011  0539    HgbA1C:  Lab Results  Component Value Date   HGBA1C 11.4 (H) 12/03/2015    Urine Drug Screen:     Component Value Date/Time   LABOPIA NEGATIVE 01/13/2014 1529   COCAINSCRNUR NEGATIVE 01/13/2014 1529   LABBENZ NEGATIVE 01/13/2014 1529   AMPHETMU NEGATIVE 01/13/2014 1529   THCU POSITIVE 01/13/2014 1529   LABBARB NEGATIVE 01/13/2014 1529    Alcohol Level: No results for input(s): ETH in the last 168 hours.  Other results: EKG: sinus rhythm at 78 bpm.  Imaging: Mr Jeri Cos YK Contrast  Result Date: 12/10/2015 CLINICAL DATA:  60 year old female with intractable nausea vomiting. Lung cancer  with metastatic disease. Previous vertex craniotomy with underlying vasogenic edema on noncontrast head CT 2 days ago. Initial encounter. EXAM: MRI HEAD WITHOUT AND WITH CONTRAST TECHNIQUE: Multiplanar, multiecho pulse sequences of the brain and surrounding structures were obtained without and with intravenous contrast. CONTRAST:  21m MULTIHANCE GADOBENATE DIMEGLUMINE 529 MG/ML IV SOLN COMPARISON:  Head CT without contrast 12/08/2015. Records in epic care everywhere document a previous CT scan performed at [facility] 10/26/2015 for history of adenocarcinoma and enhancing dural-based lesion in the right parietal lobe. FINDINGS: Sequelae of right vertex craniotomy. Widespread pachymeningeal thickening and enhancement over the superior brain (series 11, image 16). Resection cavity at the superior right parietal lobe with overlying slightly more thick and irregular dural or meningeal enhancement (series 12, image 12 and series 11, image 23. Subjacent T2 and FLAIR hyperintensity in the right parietal and posterior right frontal lobes with mild gyral expansion and regional mass effect (series 6, image 20). Superimposed small 5 mm focus of white matter restricted diffusion a mix the area of vasogenic edema as seen on series 100 and series 101, image 41. Furthermore, there is confluent 3 cm area of trace diffusion abnormality in the inferior right cerebellum. Isointense to slightly restricted on ADC. Associated T2 and FLAIR hyperintensity without hemorrhage or mass effect. There is T2 and FLAIR hyperintensity in the medial right thalamus with increased trace diffusion signal but mild if any diffusion restriction. There are small foci ranging from punctate to 5 mm in the left occipital and parietal lobes with possible restricted diffusion. Mild if any associated T2 and FLAIR signal changes in those areas. No acute intracranial hemorrhage identified. No enhancement at any of these areas. Major  intracranial vascular flow voids are preserved. Confluent T2 hyperintensity in the pons and scattered small areas of nonspecific white matter T2 and FLAIR hyperintensity in addition to those described above are noted. No chronic cerebral blood products except at the right parietal resection cavity. No ventriculomegaly. Patent basilar cisterns. Negative pituitary, cervicomedullary junction and visualized cervical spine. Visualized bone marrow signal is within normal limits. Small volume postoperative extra-axial collection overlying the resection cavity. Visible internal auditory structures appear normal. Trace right mastoid effusion. Paranasal sinuses are clear. No acute orbit or scalp soft tissue findings. IMPRESSION: 1. Early subacute appearing - mostly lacunar type - infarcts in the right PICA territory, scattered both PCA territories, and also in the right MCA/PCA territory (in the area of vasogenic edema described in #2). No associated hemorrhage or infarct related mass effect. 2. Right parietal craniotomy and resection cavity. Mildly irregular dural/meningeal thickening at the resection site with regional vasogenic edema and mild mass effect. This appearance is suspicious for metastatic disease recurrence, and comparison with previous brain MRIs is recommended. 3. No other metastatic disease to the brain suspected; widespread smooth dural thickening over  both hemispheres is favored to be postoperative in nature. Electronically Signed   By: Genevie Ann M.D.   On: 12/10/2015 10:46   US Carotid Bilateral  Result Date: 12/10/2015 CLINICAL DATA:  CVA. EXAM: BILATERAL CAROTID DUPLEX ULTRASOUND TECHNIQUE: Pearline Cables scale imaging, color Doppler and duplex ultrasound were performed of bilateral carotid and vertebral arteries in the neck. COMPARISON:  MRI 12/10/2015 . FINDINGS: Criteria: Quantification of carotid stenosis is based on velocity parameters that correlate the residual internal carotid diameter with NASCET-based  stenosis levels, using the diameter of the distal internal carotid lumen as the denominator for stenosis measurement. The following velocity measurements were obtained: RIGHT ICA:  106/30 sec cm/sec CCA:  956/21 a cm/sec SYSTOLIC ICA/CCA RATIO:  3.08 DIASTOLIC ICA/CCA RATIO:  6.57 ECA:  147 cm/sec LEFT ICA:  74/21 cm/sec CCA:  846/96 cm/sec SYSTOLIC ICA/CCA RATIO:  0.7 DIASTOLIC ICA/CCA RATIO:  1.1 ECA:  147 cm/sec RIGHT CAROTID ARTERY: Mild right carotid bifurcation atherosclerotic vascular disease. No flow limiting stenosis. RIGHT VERTEBRAL ARTERY:  Patent with antegrade flow. LEFT CAROTID ARTERY: Mild left carotid bifurcation atherosclerotic vascular disease. No flow limiting stenosis. LEFT VERTEBRAL ARTERY:  Patent with antegrade flow. IMPRESSION: 1. 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 No flow limiting stenosis. Degree of stenosis less than 50% bilaterally. 2. Vertebral arteries are patent with antegrade flow. Electronically Signed   By: Marcello Moores  Register   On: 12/10/2015 15:28   Dg Hip Unilat With Pelvis 2-3 Views Left  Result Date: 12/10/2015 CLINICAL DATA:  60 year old female with acute onset bilateral hip pain with no known injury. Initial encounter. Prior right hip arthroplasty. EXAM: DG HIP (WITH OR WITHOUT PELVIS) 2-3V LEFT COMPARISON:  Right hip series from today reported separately. CT Abdomen and Pelvis 12/02/2015. FINDINGS: Pelvis appears intact. Previous lumbar spine surgery. Right hip arthroplasty changes. Moderate left hip joint space loss with subchondral sclerosis and osteophytosis of the acetabulum. Proximal left femur intact. No acute osseous abnormality identified. IMPRESSION: 1. Moderate left hip osteoarthritis. No acute osseous abnormality identified. 2. Right hip series today reported separately. Electronically Signed   By: Genevie Ann M.D.   On: 12/10/2015 10:55   Dg Hip Unilat With Pelvis 2-3 Views Right  Result Date: 12/10/2015 CLINICAL DATA:   60 year old female with acute onset bilateral hip pain with no known injury. Initial encounter. Prior right hip arthroplasty. EXAM: DG HIP (WITH OR WITHOUT PELVIS) 2-3V RIGHT COMPARISON:  CT Abdomen and Pelvis 12/02/2015. Right hip series 01/24/12. FINDINGS: Lower lumbar spine postoperative changes also noted. Right hip arthroplasty hardware appears stable and intact. Pelvis intact. SI joints within normal limits. Moderate left hip joint space loss with acetabular subchondral sclerosis and osteophytosis. No acute osseous abnormality identified. Negative visible bowel gas pattern. IMPRESSION: 1. No acute osseous abnormality identified. About the right hip per pelvis. 2. Right hip arthroplasty hardware appears intact and normally aligned. 3. Left hip reported separately. Electronically Signed   By: Genevie Ann M.D.   On: 12/10/2015 10:54    Assessment: 60 y.o. female with history of brain metastasis admitted with intractable nausea and vomiting.  MRI of the brain performed in work up and personally reviewed.  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  Possible evidence of metastatic recurrence noted as well.  Infarcts likely secondary to hypercoagulable state leading to embolus formation.  Carotid dopplers show no evidence of hemodynamically significant stenosis.  Echocardiogram shows no cardiac source of emboli with an EF of 55-60%.  A1c 11.4.  Stroke Risk Factors - diabetes mellitus, hypercoagulable state and hypertension  Plan: 1. Fasting lipid panel 2. PT consult, OT consult, Speech consult 3. Prophylactic therapy-Antiplatelet med: Aspirin - dose 325 mg daily 4. NPO until RN stroke swallow screen 5. Telemetry monitoring 6. Frequent neuro checks 7. Oncology following patient   Alexis Goodell, MD Neurology [phone] 12/11/2015, 10:50 AM

## 2015-12-11 NOTE — Progress Notes (Addendum)
Inpatient Diabetes Program Recommendations  AACE/ADA: New Consensus Statement on Inpatient Glycemic Control (2015)  Target Ranges:  Prepandial:   less than 140 mg/dL      Peak postprandial:   less than 180 mg/dL (1-2 hours)      Critically ill patients:  140 - 180 mg/dL   Lab Results  Component Value Date   GLUCAP 374 (H) 12/11/2015   HGBA1C 11.4 (H) 12/03/2015   Results for [name], Marie NAPOLES (MRN [id number]) as of 12/11/2015 07:05  Ref. Range 12/10/2015 08:02 12/10/2015 11:45 12/10/2015 16:41 12/10/2015 21:00 12/11/2015 00:02  Glucose-Capillary Latest Ref Range: 65 - 99 mg/dL 362 (H) 376 (H) 320 (H) 497 (H) 374 (H)    Diabetes history:Type 2 diabetes Outpatient Diabetes medications: Toujeo 50 units daily, Glucotrol 5 mg bid, Metformin 500 mg bid  Current orders for Inpatient glycemic control: Lantus 30 units qhs, Novolog moderate tid, Novolog  HS-Decadron 8 mg q 6 hours  Inpatient Diabetes Program Recommendations: Please consider increasing Novolog correction to resistant correction (0-20 units) q4h since patient is on steroids and CBG remain elevated.   Consider increasing Lantus to 50 units qhs.  Gentry Fitz, RN, BA, MHA, CDE Diabetes Coordinator Inpatient Diabetes Program  [phone] (Team Pager) [phone] ([place]) 12/11/2015 7:06 AM

## 2015-12-11 NOTE — Progress Notes (Signed)
[place] CONSULT NOTE  Patient Care Team: Cletis Athens, MD as PCP - General (Unknown Physician Specialty)  CC: Continues to feel poorly. However, slightly better since admission the hospital. Headaches slightly improved. Intermittent nausea no vomiting.   Positive for constipation; no chest pain. Continued back pain. Pain.  MEDICAL HISTORY:  Past Medical History:  Diagnosis Date  . Arthritis   . Cancer ([place])    Lung CA right wedge removed  . CHF (congestive heart failure) ([place])   . COPD (chronic obstructive pulmonary disease) ([facility])    has oxygen but doesn't use  . Diabetes mellitus type 2 with neurological manifestations ([facility])    Peripheral neuropathy and foot ulcers  . Hypertension   . PAD (peripheral artery disease) ([place])   . Peripheral neuropathy ([place])   . Shortness of breath dyspnea     SURGICAL HISTORY: Past Surgical History:  Procedure Laterality Date  . ABDOMINAL HYSTERECTOMY    . BACK SURGERY  x 7  . CARPAL TUNNEL RELEASE Bilateral   . CESAREAN SECTION     x3  . CORONARY ANGIOPLASTY     no stents  . FOOT SURGERY Left    diabetic ulcer  . KNEE ARTHROSCOPY Bilateral   . TONSILLECTOMY    . VIDEO ASSISTED THORACOSCOPY (VATS)/THOROCOTOMY Right 08/26/2014   Procedure: VIDEO ASSISTED THORACOSCOPY (VATS)/THOROCOTOMY;  Surgeon: Nestor Lewandowsky, MD;  Location: [facility] ORS;  Service: General;  Laterality: Right;    SOCIAL HISTORY: Social History   Social History  . Marital status: Widowed    Spouse name: N/A  . Number of children: N/A  . Years of education: N/A   Occupational History  . Not on file.   Social History Main Topics  . Smoking status: Current Every Day Smoker    Packs/day: 0.50    Years: 46.00    Types: Cigarettes  . Smokeless tobacco: Never Used  . Alcohol use No  . Drug use: No  . Sexual activity: Not on file   Other Topics Concern  . Not on file   Social History Narrative  . No narrative on file    FAMILY HISTORY: Family  History  Problem Relation Age of Onset  . Hypertension Brother   . Coronary artery disease Mother   . Diabetes Mellitus II Mother   . Coronary artery disease Father   . Diabetes Mellitus II Father   . Diabetes Mellitus II Brother     ALLERGIES:  is allergic to ampicillin; penicillins; avelox [moxifloxacin hcl in nacl]; etodolac; and neurontin [gabapentin].  MEDICATIONS:  Current Facility-Administered Medications  Medication Dose Route Frequency Provider Last Rate Last Dose  . acetaminophen (TYLENOL) tablet 650 mg  650 mg Oral Q6H PRN Lance Coon, MD       Or  . acetaminophen (TYLENOL) suppository 650 mg  650 mg Rectal Q6H PRN Lance Coon, MD      . ALPRAZolam Duanne Moron) tablet 0.25 mg  0.25 mg Oral BID Lance Coon, MD   0.25 mg at 12/11/15 0939  . aspirin EC tablet 81 mg  81 mg Oral Daily Hillary Bow, MD   81 mg at 12/11/15 0940  . dexamethasone (DECADRON) tablet 4 mg  4 mg Oral Q8H Cammie Sickle, MD   4 mg at 12/11/15 1318  . DULoxetine (CYMBALTA) DR capsule 60 mg  60 mg Oral Daily Lance Coon, MD   60 mg at 12/11/15 0939  . enoxaparin (LOVENOX) injection 40 mg  40 mg Subcutaneous QHS Lance Coon, MD  40 mg at 12/10/15 2139  . feeding supplement (ENSURE ENLIVE) (ENSURE ENLIVE) liquid 237 mL  237 mL Oral BID BM Lytle Butte, MD   237 mL at 12/11/15 1400  . HYDROmorphone (DILAUDID) injection 0.5 mg  0.5 mg Intravenous Q4H PRN Lance Coon, MD   0.5 mg at 12/11/15 1318  . insulin aspart (novoLOG) injection 0-15 Units  0-15 Units Subcutaneous TID WC Lytle Butte, MD   15 Units at 12/11/15 1719  . insulin aspart (novoLOG) injection 0-5 Units  0-5 Units Subcutaneous QHS Lance Coon, MD   4 Units at 12/09/15 2201  . insulin glargine (LANTUS) injection 50 Units  50 Units Subcutaneous QHS Srikar Sudini, MD      . levETIRAcetam (KEPPRA) tablet 500 mg  500 mg Oral BID Lance Coon, MD   500 mg at 12/11/15 0939  . metoCLOPramide (REGLAN) tablet 5 mg  5 mg Oral TID AC Srikar Sudini,  MD   5 mg at 12/11/15 1718  . mometasone-formoterol (DULERA) 200-5 MCG/ACT inhaler 2 puff  2 puff Inhalation BID Lance Coon, MD   2 puff at 12/11/15 0940  . ondansetron (ZOFRAN) tablet 4 mg  4 mg Oral Q6H PRN Lance Coon, MD   4 mg at 12/10/15 0131   Or  . ondansetron [facility]) injection 4 mg  4 mg Intravenous Q6H PRN Lance Coon, MD   4 mg at 12/11/15 1545  . oxyCODONE (Oxy IR/ROXICODONE) immediate release tablet 10 mg  10 mg Oral Q4H PRN Knox Royalty, NP   10 mg at 12/11/15 1428  . pantoprazole (PROTONIX) EC tablet 40 mg  40 mg Oral QAC breakfast Lance Coon, MD   40 mg at 12/11/15 0826  . polyethylene glycol (MIRALAX / GLYCOLAX) packet 17 g  17 g Oral Daily PRN Hillary Bow, MD   17 g at 12/11/15 1559  . pregabalin (LYRICA) capsule 150 mg  150 mg Oral BID Lance Coon, MD   150 mg at 12/11/15 0939  . rosuvastatin (CRESTOR) tablet 10 mg  10 mg Oral Daily Lance Coon, MD   10 mg at 12/11/15 0939  . senna-docusate (Senokot-S) tablet 2 tablet  2 tablet Oral BID Srikar Sudini, MD      . sodium chloride flush (NS) 0.9 % injection 3 mL  3 mL Intravenous Q12H Lance Coon, MD   3 mL at 12/11/15 1000      .  PHYSICAL EXAMINATION:  Vitals:   12/11/15 1158 12/11/15 1529  BP: 132/74   Pulse: 75 88  Resp: 16   Temp: 98.2 F (36.8 C)    Filed Weights   12/08/15 1905  Weight: 198 lb (89.8 kg)    GENERAL: Well-nourished well-developed; Alert, no distress and comfortable.  Alone. EYES: no pallor or icterus OROPHARYNX: no thrush or ulceration. NECK: supple, no masses felt LYMPH:  no palpable lymphadenopathy in the cervical, axillary or inguinal regions LUNGS: decreased breath sounds to auscultation at bases and  No wheeze or crackles HEART/CVS: regular rate & rhythm and no murmurs; No lower extremity edema ABDOMEN: abdomen soft, non-tender and normal bowel sounds Musculoskeletal:no cyanosis of digits and no clubbing; difficulty in lifting the right leg because of pain. PSYCH: alert &  oriented x 3 with fluent speech NEURO: no focal motor/sensory deficits SKIN:  no rashes or significant lesions  LABORATORY DATA:  I have reviewed the data as listed Lab Results  Component Value Date   WBC 11.3 (H) 12/09/2015   HGB 10.7 (L) 12/09/2015  HCT 31.9 (L) 12/09/2015   MCV 89.7 12/09/2015   PLT 287 12/09/2015    Recent Labs  12/02/15 2126  12/04/15 0600 12/05/15 0618  12/07/15 0736 12/08/15 1940 12/09/15 0500 12/10/15 2110  NA 135  < > 139 137  --   --  139 139  --   K 3.8  < > 3.5 3.3*  < > 3.9 3.6 4.0  --   CL 102  < > 107 105  --   --  105 105  --   CO2 23  < > 24 23  --   --  23 24  --   GLUCOSE 180*  < > 150* 205*  --   --  199* 258* 448*  BUN 23*  < > 10 8  --   --  10 13  --   CREATININE 1.22*  < > 0.56 0.63  --   --  0.63 0.69  --   CALCIUM 8.4*  < > 8.2* 8.4*  --   --  9.1 8.5*  --   GFRNONAA 48*  < > >60 >60  --   --  >60 >60  --   GFRAA 55*  < > >60 >60  --   --  >60 >60  --   PROT 7.0  --  6.5  --   --   --  6.9  --   --   ALBUMIN 2.5*  --  2.1*  --   --   --  2.4*  --   --   AST 22  --  20  --   --   --  19  --   --   ALT 19  --  19  --   --   --  23  --   --   ALKPHOS 155*  --  160*  --   --   --  201*  --   --   BILITOT 0.5  --  0.2*  --   --   --  0.8  --   --   < > = values in this interval not displayed.  RADIOGRAPHIC STUDIES: I have personally reviewed the radiological images as listed and agreed with the findings in the report. Dg Chest 1 View  Result Date: 12/02/2015 CLINICAL DATA:  Code sepsis.  Lung cancer and altered mental status EXAM: CHEST 1 VIEW COMPARISON:  06/05/2015 FINDINGS: Chronic hazy appearance at the right base where there are lung sutures. Scar in this region on 2016 chest CT. Mild increase in left basilar markings which was also seen previously. No cardiomegaly for technique. Negative aortic and hilar contours. IMPRESSION: Stable compared to prior, including postoperative changes in the right lower lobe. No convincing  pneumonia. Electronically Signed   By: Monte Fantasia M.D.   On: 12/02/2015 21:49   Ct Head Wo Contrast  Result Date: 12/08/2015 CLINICAL DATA:  "Not feeling well" in a patient with Brain cancer. Records in epic care everywhere document a previous CT scan performed at [place] on 10/26/2015 for history of adenocarcinoma and enhancing dural-based lesion in the right parietal lobe. EXAM: CT HEAD WITHOUT CONTRAST TECHNIQUE: Contiguous axial images were obtained from the base of the skull through the vertex without intravenous contrast. COMPARISON:  None. FINDINGS: Brain: High right parietal vasogenic edema is noted with a probable peripheral 10 mm hyper attenuating lesion (image 26 series 2). Overlying craniotomy defect is evident. 8 mm hypo attenuating lesion  is identified in the right thalamus, not described previously. Wedge-shaped area of hypoattenuation identified in the inferior right cerebellar hemisphere. On imaging today, this has an appearnace of encephalomalacia related infarct, but could certainly represent vasogenic edema from mass lesion. This finding was not described up on the CT report from [place] about 1 month ago, suggesting that it is a new interval finding. No evidence for midline shift. No hydrocephalus. No CT findings of acute hemorrhage. Vascular: Atherosclerotic calcification is visualized in the carotid arteries. No dense MCA sign. Major dural sinuses are unremarkable. Skull: High right parietal craniotomy defect noted. Sinuses/Orbits: The visualized paranasal sinuses and mastoid air cells are clear. Visualized portions of the globes and intraorbital fat are unremarkable. Other: N/A IMPRESSION: 1. Chronic right parietal vasogenic edema with probable 10 mm hyper attenuating lesion towards the vertex. Overlying craniotomy defect suggest previous surgery in this region and vasogenic edema was described in this area on previous outside CT report  from [employer] dated 10/26/2015. 2. 8 mm hypo attenuating lesion right down was not described previously. Age indeterminate lacunar infarction could have this appearance. MRI without and with contrast may prove helpful to further evaluate. 3. Peripheral wedge-shaped area of apparent encephalomalacia in the inferior right cerebellar hemisphere from so not described previously. This could represent an area of post ischemic encephalomalacia but given the history of metastatic disease to the brain, vasogenic edema from metastatic deposit cannot be entirely excluded. This could also be better assessed by MRI. 4. No hydrocephalus or midline shift. No evidence for acute hemorrhage. Electronically Signed   By: Misty Stanley M.D.   On: 12/08/2015 20:07   Mr Jeri Cos GE Contrast  Result Date: 12/10/2015 CLINICAL DATA:  60 year old female with intractable nausea vomiting. Lung cancer with metastatic disease. Previous vertex craniotomy with underlying vasogenic edema on noncontrast head CT 2 days ago. Initial encounter. EXAM: MRI HEAD WITHOUT AND WITH CONTRAST TECHNIQUE: Multiplanar, multiecho pulse sequences of the brain and surrounding structures were obtained without and with intravenous contrast. CONTRAST:  2m MULTIHANCE GADOBENATE DIMEGLUMINE 529 MG/ML IV SOLN COMPARISON:  Head CT without contrast 12/08/2015. Records in epic care everywhere document a previous CT scan performed at [facility] 10/26/2015 for history of adenocarcinoma and enhancing dural-based lesion in the right parietal lobe. FINDINGS: Sequelae of right vertex craniotomy. Widespread pachymeningeal thickening and enhancement over the superior brain (series 11, image 16). Resection cavity at the superior right parietal lobe with overlying slightly more thick and irregular dural or meningeal enhancement (series 12, image 12 and series 11, image 23. Subjacent T2 and FLAIR hyperintensity in the right parietal and  posterior right frontal lobes with mild gyral expansion and regional mass effect (series 6, image 20). Superimposed small 5 mm focus of white matter restricted diffusion a mix the area of vasogenic edema as seen on series 100 and series 101, image 41. Furthermore, there is confluent 3 cm area of trace diffusion abnormality in the inferior right cerebellum. Isointense to slightly restricted on ADC. Associated T2 and FLAIR hyperintensity without hemorrhage or mass effect. There is T2 and FLAIR hyperintensity in the medial right thalamus with increased trace diffusion signal but mild if any diffusion restriction. There are small foci ranging from punctate to 5 mm in the left occipital and parietal lobes with possible restricted diffusion. Mild if any associated T2 and FLAIR signal changes in those areas. No acute intracranial hemorrhage identified. No enhancement at any of these areas. Major  intracranial vascular flow voids are preserved. Confluent T2 hyperintensity in the pons and scattered small areas of nonspecific white matter T2 and FLAIR hyperintensity in addition to those described above are noted. No chronic cerebral blood products except at the right parietal resection cavity. No ventriculomegaly. Patent basilar cisterns. Negative pituitary, cervicomedullary junction and visualized cervical spine. Visualized bone marrow signal is within normal limits. Small volume postoperative extra-axial collection overlying the resection cavity. Visible internal auditory structures appear normal. Trace right mastoid effusion. Paranasal sinuses are clear. No acute orbit or scalp soft tissue findings. IMPRESSION: 1. Early subacute appearing - mostly lacunar type - infarcts in the right PICA territory, scattered both PCA territories, and also in the right MCA/PCA territory (in the area of vasogenic edema described in #2). No associated hemorrhage or infarct related mass effect. 2. Right parietal craniotomy and resection  cavity. Mildly irregular dural/meningeal thickening at the resection site with regional vasogenic edema and mild mass effect. This appearance is suspicious for metastatic disease recurrence, and comparison with previous brain MRIs is recommended. 3. No other metastatic disease to the brain suspected; widespread smooth dural thickening over both hemispheres is favored to be postoperative in nature. Electronically Signed   By: Genevie Ann M.D.   On: 12/10/2015 10:46   Ct Abdomen Pelvis W Contrast  Result Date: 12/03/2015 CLINICAL DATA:  Severe sepsis. Right lower quadrant abdominal pain. Lung cancer. EXAM: CT ABDOMEN AND PELVIS WITH CONTRAST TECHNIQUE: Multidetector CT imaging of the abdomen and pelvis was performed using the standard protocol following bolus administration of intravenous contrast. CONTRAST:  160m ISOVUE-300 IOPAMIDOL (ISOVUE-300) INJECTION 61% COMPARISON:  Head CT 07/09/2014.  Chest CT 09/25/2014 FINDINGS: Lower chest and abdominal wall: Partly seen ill-defined airspace opacity in the right middle lobe. There are new pulmonary nodules in the bibasilar lungs, largest in the right posterior costophrenic sulcus measuring 12 mm. Per report in care everywhere 10/23/2015 these are new/progressed. Septal thickening consistent with edema. Hepatobiliary: Numerous low-density liver masses that are new compared to chest CT comparison. These measure up to 3 cm in size in multiple segments. Although low-density they are likely solid. Multi focal abscess considered unlikely.Cholecystectomy. Normal common bile duct diameter. Pancreas: Unremarkable. Spleen: Unremarkable. Adrenals/Urinary Tract: Negative adrenals. Patchy heterogeneous hypo enhancement in the kidneys having ill-defined masslike appearance. Pyelonephritis is considered given the clinical circumstances, but metastases are favored. Unremarkable bladder. Stomach/Bowel:  No obstruction. No appendicitis. Reproductive:Hysterectomy. Possible right  oophorectomy. Negative left ovary. Vascular/Lymphatic: No acute vascular abnormality. Mild enlargement of lymph nodes in the deep liver drainage. Other: No ascites or pneumoperitoneum. Musculoskeletal: No acute or aggressive finding. L3-4 and L4-5 discectomy with solid bony fusion. IMPRESSION: 1. Partly seen right middle lobe pneumonia. Nodules in the bilateral lungs are primarily concerning for metastatic foci, but hematogenous infection is not excluded as these have rapidly developed compared to 10/23/2015 chest CT report. 2. Pulmonary edema. 3. Abnormal bilateral renal perfusion which could be pyelonephritis or metastases. Correlate with urinalysis and follow-up. 4. Numerous hepatic metastases. Electronically Signed   By: JMonte FantasiaM.D.   On: 12/03/2015 00:23   UKoreaCarotid Bilateral  Result Date: 12/10/2015 CLINICAL DATA:  CVA. EXAM: BILATERAL CAROTID DUPLEX ULTRASOUND TECHNIQUE: GPearline Cablesscale imaging, color Doppler and duplex ultrasound were performed of bilateral carotid and vertebral arteries in the neck. COMPARISON:  MRI 12/10/2015 . FINDINGS: Criteria: Quantification of carotid stenosis is based on velocity parameters that correlate the residual internal carotid diameter with NASCET-based stenosis levels, using the diameter of the distal internal carotid  lumen as the denominator for stenosis measurement. The following velocity measurements were obtained: RIGHT ICA:  106/30 sec cm/sec CCA:  277/41 a cm/sec SYSTOLIC ICA/CCA RATIO:  2.87 DIASTOLIC ICA/CCA RATIO:  8.67 ECA:  147 cm/sec LEFT ICA:  74/21 cm/sec CCA:  672/09 cm/sec SYSTOLIC ICA/CCA RATIO:  0.7 DIASTOLIC ICA/CCA RATIO:  1.1 ECA:  147 cm/sec RIGHT CAROTID ARTERY: Mild right carotid bifurcation atherosclerotic vascular disease. No flow limiting stenosis. RIGHT VERTEBRAL ARTERY:  Patent with antegrade flow. LEFT CAROTID ARTERY: Mild left carotid bifurcation atherosclerotic vascular disease. No flow limiting stenosis. LEFT VERTEBRAL ARTERY:   Patent with antegrade flow. IMPRESSION: 1. 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 No flow limiting stenosis. Degree of stenosis less than 50% bilaterally. 2. Vertebral arteries are patent with antegrade flow. Electronically Signed   By: Marcello Moores  Register   On: 12/10/2015 15:28   Dg Chest Portable 1 View  Result Date: 12/02/2015 CLINICAL DATA:  Central line placement EXAM: PORTABLE CHEST 1 VIEW COMPARISON:  Earlier today FINDINGS: New right IJ central line with tip at the SVC level. No pneumothorax or new mediastinal widening. New bilateral interstitial prominence suggesting edema. Normal heart size and stable mediastinal contours. Postoperative changes in the right lower lobe are becoming obscured. IMPRESSION: 1. New central line without adverse finding. 2. New pulmonary edema. Electronically Signed   By: Monte Fantasia M.D.   On: 12/02/2015 23:37   Dg Foot Complete Left  Result Date: 11/24/2015 CLINICAL DATA:  Open wound on left foot, initial encounter EXAM: LEFT FOOT - COMPLETE 3+ VIEW COMPARISON:  10/31/2014 FINDINGS: Changes in the left second toe which have progressed somewhat in the interval from the prior exam particularly in the proximal phalanx. Soft tissue wound is again noted. No frank bony destruction to suggest osteomyelitis is seen. Clinical correlation is recommended. IMPRESSION: Some mild progressive loss at the base of the second proximal phalanx. Associated soft tissue wound is noted although no definitive bony destruction is seen. Electronically Signed   By: Inez Catalina M.D.   On: 11/24/2015 16:20   Dg Hip Unilat With Pelvis 2-3 Views Left  Result Date: 12/10/2015 CLINICAL DATA:  60 year old female with acute onset bilateral hip pain with no known injury. Initial encounter. Prior right hip arthroplasty. EXAM: DG HIP (WITH OR WITHOUT PELVIS) 2-3V LEFT COMPARISON:  Right hip series from today reported separately. CT Abdomen and Pelvis 12/02/2015. FINDINGS:  Pelvis appears intact. Previous lumbar spine surgery. Right hip arthroplasty changes. Moderate left hip joint space loss with subchondral sclerosis and osteophytosis of the acetabulum. Proximal left femur intact. No acute osseous abnormality identified. IMPRESSION: 1. Moderate left hip osteoarthritis. No acute osseous abnormality identified. 2. Right hip series today reported separately. Electronically Signed   By: Genevie Ann M.D.   On: 12/10/2015 10:55   Dg Hip Unilat With Pelvis 2-3 Views Right  Result Date: 12/10/2015 CLINICAL DATA:  60 year old female with acute onset bilateral hip pain with no known injury. Initial encounter. Prior right hip arthroplasty. EXAM: DG HIP (WITH OR WITHOUT PELVIS) 2-3V RIGHT COMPARISON:  CT Abdomen and Pelvis 12/02/2015. Right hip series 01/24/12. FINDINGS: Lower lumbar spine postoperative changes also noted. Right hip arthroplasty hardware appears stable and intact. Pelvis intact. SI joints within normal limits. Moderate left hip joint space loss with acetabular subchondral sclerosis and osteophytosis. No acute osseous abnormality identified. Negative visible bowel gas pattern. IMPRESSION: 1. No acute osseous abnormality identified. About the right hip per pelvis. 2. Right hip arthroplasty hardware appears intact and normally  aligned. 3. Left hip reported separately. Electronically Signed   By: Genevie Ann M.D.   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  # MRI brain shows- acute infarcts without any significant edema or mass effect. Patient is on antiplatelet therapy.  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 No significant worsening of the disease noted on the MRI.  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 We'll taper the dose of steroids   # Poor control blood sugars - recommend tapering steroids   # Appreciated palliative care consultation. Discussed DNR/DNI- patient does not want any resuscitation; patient however wants to try systemic treatment. Overall poor prognosis.  # Patient can be discharged from oncology standpoint;  physical therapy/rehabilitatioN  Discuss with Dr.Sudini.     Cammie Sickle, MD 12/11/2015 7:29 PM

## 2015-12-11 NOTE — Progress Notes (Signed)
[facility] [place] NAME: Marie [name]    MRN#:  [id number]  DATE OF BIRTH:  09/25/1955  SUBJECTIVE:  Hospital Day: 0 days Marie [name] is a 60 y.o. female presenting with Weakness .   generalized weakness and vomiting Had radiation for brain metastases earlier today.  REVIEW OF SYSTEMS:  CONSTITUTIONAL: No fever, fatigue or weakness.  EYES: No blurred or double vision.  EARS, NOSE, AND THROAT: No tinnitus or ear pain.  RESPIRATORY: No cough, shortness of breath, wheezing or hemoptysis.  CARDIOVASCULAR: No chest pain, orthopnea, edema.  GASTROINTESTINAL: Nausea and vomiting GENITOURINARY: No dysuria, hematuria.  ENDOCRINE: No polyuria, nocturia,  HEMATOLOGY: No anemia, easy bruising or bleeding SKIN: No rash or lesion. MUSCULOSKELETAL: No joint pain or arthritis.   NEUROLOGIC: No tingling, numbness, weakness.  PSYCHIATRY: No anxiety or depression.   DRUG ALLERGIES:   Allergies  Allergen Reactions  . Ampicillin Anaphylaxis  . Penicillins Anaphylaxis and Shortness Of Breath    Has patient had a PCN reaction causing immediate rash, facial/tongue/throat swelling, SOB or lightheadedness with hypotension: yes Has patient had a PCN reaction causing severe rash involving mucus membranes or skin necrosis: no Has patient had a PCN reaction that required hospitalization no Has patient had a PCN reaction occurring within the last 10 years: no If all of the above answers are "NO", then may proceed with Cephalosporin use.   . Avelox [Moxifloxacin Hcl In Nacl] Other (See Comments)    Reaction: MUSCLE CRAMPS  . Etodolac Rash  . Neurontin [Gabapentin] Anxiety    VITALS:  Blood pressure 132/74, pulse 75, temperature 98.2 F (36.8 C), temperature source Oral, resp. rate 16, height '5\' 5"'$  (1.651 m), weight 89.8 kg (198 lb), SpO2 96 %.  PHYSICAL EXAMINATION:  VITAL SIGNS: Vitals:   12/11/15 0957 12/11/15 1158  BP: (!) 159/84  132/74  Pulse:  75  Resp:  16  Temp:  98.2 F (36.8 C)   GENERAL:60 y.o.female currently in no acute distress.  HEAD: Normocephalic, atraumatic.  EYES: Pupils equal, round, reactive to light. Extraocular muscles intact. No scleral icterus.  MOUTH: Moist mucosal membrane. Dentition intact. No abscess noted.  EAR, NOSE, THROAT: Clear without exudates. No external lesions.  NECK: Supple. No thyromegaly. No nodules. No JVD.  PULMONARY: Clear to ascultation, without wheeze rails or rhonci. No use of accessory muscles, Good respiratory effort. good air entry bilaterally CHEST: Nontender to palpation.  CARDIOVASCULAR: S1 and S2. Regular rate and rhythm. No murmurs, rubs, or gallops. No edema. Pedal pulses 2+ bilaterally.  GASTROINTESTINAL: Soft, nontender, nondistended. No masses. Positive bowel sounds. No hepatosplenomegaly.  MUSCULOSKELETAL: No swelling, clubbing, or edema. Range of motion full in all extremities.  NEUROLOGIC: Cranial nerves II through XII are intact. No gross focal neurological deficits. Sensation intact. Reflexes intact.  SKIN: No ulceration, lesions, rashes, or cyanosis. Skin warm and dry. Turgor intact.  PSYCHIATRIC: Mood, affect within normal limits. The patient is awake, alert and oriented x 3. Insight, judgment intact.   LABORATORY PANEL:   CBC  Recent Labs Lab 12/09/15 0500  WBC 11.3*  HGB 10.7*  HCT 31.9*  PLT 287   ------------------------------------------------------------------------------------------------------------------  Chemistries   Recent Labs Lab 12/08/15 1940 12/09/15 0500 12/10/15 2110  NA 139 139  --   K 3.6 4.0  --   CL 105 105  --   CO2 23 24  --   GLUCOSE 199* 258* 448*  BUN 10 13  --   CREATININE  0.63 0.69  --   CALCIUM 9.1 8.5*  --   MG 1.6*  --   --   AST 19  --   --   ALT 23  --   --   ALKPHOS 201*  --   --   BILITOT 0.8  --   --     ------------------------------------------------------------------------------------------------------------------  Cardiac Enzymes No results for input(s): TROPONINI in the last 168 hours. ------------------------------------------------------------------------------------------------------------------  RADIOLOGY:  Mr Jeri Cos Wo Contrast  Result Date: 12/10/2015 CLINICAL DATA:  60 year old female with intractable nausea vomiting. Lung cancer with metastatic disease. Previous vertex craniotomy with underlying vasogenic edema on noncontrast head CT 2 days ago. Initial encounter. EXAM: MRI HEAD WITHOUT AND WITH CONTRAST TECHNIQUE: Multiplanar, multiecho pulse sequences of the brain and surrounding structures were obtained without and with intravenous contrast. CONTRAST:  38m MULTIHANCE GADOBENATE DIMEGLUMINE 529 MG/ML IV SOLN COMPARISON:  Head CT without contrast 12/08/2015. Records in epic care everywhere document a previous CT scan performed at [facility] 10/26/2015 for history of adenocarcinoma and enhancing dural-based lesion in the right parietal lobe. FINDINGS: Sequelae of right vertex craniotomy. Widespread pachymeningeal thickening and enhancement over the superior brain (series 11, image 16). Resection cavity at the superior right parietal lobe with overlying slightly more thick and irregular dural or meningeal enhancement (series 12, image 12 and series 11, image 23. Subjacent T2 and FLAIR hyperintensity in the right parietal and posterior right frontal lobes with mild gyral expansion and regional mass effect (series 6, image 20). Superimposed small 5 mm focus of white matter restricted diffusion a mix the area of vasogenic edema as seen on series 100 and series 101, image 41. Furthermore, there is confluent 3 cm area of trace diffusion abnormality in the inferior right cerebellum. Isointense to slightly restricted on ADC. Associated T2 and FLAIR hyperintensity without  hemorrhage or mass effect. There is T2 and FLAIR hyperintensity in the medial right thalamus with increased trace diffusion signal but mild if any diffusion restriction. There are small foci ranging from punctate to 5 mm in the left occipital and parietal lobes with possible restricted diffusion. Mild if any associated T2 and FLAIR signal changes in those areas. No acute intracranial hemorrhage identified. No enhancement at any of these areas. Major intracranial vascular flow voids are preserved. Confluent T2 hyperintensity in the pons and scattered small areas of nonspecific white matter T2 and FLAIR hyperintensity in addition to those described above are noted. No chronic cerebral blood products except at the right parietal resection cavity. No ventriculomegaly. Patent basilar cisterns. Negative pituitary, cervicomedullary junction and visualized cervical spine. Visualized bone marrow signal is within normal limits. Small volume postoperative extra-axial collection overlying the resection cavity. Visible internal auditory structures appear normal. Trace right mastoid effusion. Paranasal sinuses are clear. No acute orbit or scalp soft tissue findings. IMPRESSION: 1. Early subacute appearing - mostly lacunar type - infarcts in the right PICA territory, scattered both PCA territories, and also in the right MCA/PCA territory (in the area of vasogenic edema described in #2). No associated hemorrhage or infarct related mass effect. 2. Right parietal craniotomy and resection cavity. Mildly irregular dural/meningeal thickening at the resection site with regional vasogenic edema and mild mass effect. This appearance is suspicious for metastatic disease recurrence, and comparison with previous brain MRIs is recommended. 3. No other metastatic disease to the brain suspected; widespread smooth dural thickening over both hemispheres is favored to be postoperative in nature. Electronically Signed   By: HLemmie Evens  [place] Crane M.D.   On:  12/10/2015 10:46   US Carotid Bilateral  Result Date: 12/10/2015 CLINICAL DATA:  CVA. EXAM: BILATERAL CAROTID DUPLEX ULTRASOUND TECHNIQUE: Pearline Cables scale imaging, color Doppler and duplex ultrasound were performed of bilateral carotid and vertebral arteries in the neck. COMPARISON:  MRI 12/10/2015 . FINDINGS: Criteria: Quantification of carotid stenosis is based on velocity parameters that correlate the residual internal carotid diameter with NASCET-based stenosis levels, using the diameter of the distal internal carotid lumen as the denominator for stenosis measurement. The following velocity measurements were obtained: RIGHT ICA:  106/30 sec cm/sec CCA:  308/65 a cm/sec SYSTOLIC ICA/CCA RATIO:  7.84 DIASTOLIC ICA/CCA RATIO:  6.96 ECA:  147 cm/sec LEFT ICA:  74/21 cm/sec CCA:  295/28 cm/sec SYSTOLIC ICA/CCA RATIO:  0.7 DIASTOLIC ICA/CCA RATIO:  1.1 ECA:  147 cm/sec RIGHT CAROTID ARTERY: Mild right carotid bifurcation atherosclerotic vascular disease. No flow limiting stenosis. RIGHT VERTEBRAL ARTERY:  Patent with antegrade flow. LEFT CAROTID ARTERY: Mild left carotid bifurcation atherosclerotic vascular disease. No flow limiting stenosis. LEFT VERTEBRAL ARTERY:  Patent with antegrade flow. IMPRESSION: 1. 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 No flow limiting stenosis. Degree of stenosis less than 50% bilaterally. 2. Vertebral arteries are patent with antegrade flow. Electronically Signed   By: Marcello Moores  Register   On: 12/10/2015 15:28   Dg Hip Unilat With Pelvis 2-3 Views Left  Result Date: 12/10/2015 CLINICAL DATA:  60 year old female with acute onset bilateral hip pain with no known injury. Initial encounter. Prior right hip arthroplasty. EXAM: DG HIP (WITH OR WITHOUT PELVIS) 2-3V LEFT COMPARISON:  Right hip series from today reported separately. CT Abdomen and Pelvis 12/02/2015. FINDINGS: Pelvis appears intact. Previous lumbar spine surgery. Right hip arthroplasty changes.  Moderate left hip joint space loss with subchondral sclerosis and osteophytosis of the acetabulum. Proximal left femur intact. No acute osseous abnormality identified. IMPRESSION: 1. Moderate left hip osteoarthritis. No acute osseous abnormality identified. 2. Right hip series today reported separately. Electronically Signed   By: Genevie Ann M.D.   On: 12/10/2015 10:55   Dg Hip Unilat With Pelvis 2-3 Views Right  Result Date: 12/10/2015 CLINICAL DATA:  59 year old female with acute onset bilateral hip pain with no known injury. Initial encounter. Prior right hip arthroplasty. EXAM: DG HIP (WITH OR WITHOUT PELVIS) 2-3V RIGHT COMPARISON:  CT Abdomen and Pelvis 12/02/2015. Right hip series 01/24/12. FINDINGS: Lower lumbar spine postoperative changes also noted. Right hip arthroplasty hardware appears stable and intact. Pelvis intact. SI joints within normal limits. Moderate left hip joint space loss with acetabular subchondral sclerosis and osteophytosis. No acute osseous abnormality identified. Negative visible bowel gas pattern. IMPRESSION: 1. No acute osseous abnormality identified. About the right hip per pelvis. 2. Right hip arthroplasty hardware appears intact and normally aligned. 3. Left hip reported separately. Electronically Signed   By: Genevie Ann M.D.   On: 12/10/2015 10:54    EKG:   Orders placed or performed during the hospital encounter of 12/08/15  . ED EKG  . ED EKG    ASSESSMENT AND PLAN:   Marie [name] is a 59 y.o. female presenting with Weakness . Admitted 12/08/2015   * Subacute lacunar  infarcts in the right PICA territory,PCA territories, and also in the right MCA/PCA territory ASA, Statin Carotid dopplers-Nothing acute Recent Echo showed no thrombus  * Lung cancer with Brain metastases Decadron, dose reduced today Radiation later today Discussed with oncology  * Chronic vomiting Continue Zofran when necessary. Add scheduled Reglan.  *  Type 2 diabetes: Restart home  medications, increase dose of Lantus, increase sliding coverage-patient   * DVT prophylaxis Lovenox   All the records are reviewed and case discussed with Care Management/Social Workerr. Management plans discussed with the patient, family and they are in agreement.  CODE STATUS: full TOTAL TIME TAKING CARE OF THIS PATIENT: 35 minutes.   POSSIBLE D/C IN 1-2DAYS, DEPENDING ON CLINICAL CONDITION.   Hillary Bow R M.D on 12/11/2015 at 2:48 PM  Between 7am to 6pm - Pager - [phone]  After 6pm: House Pager: - [facility]  Office  [phone]  CC: Primary care physician; Cletis Athens, MD

## 2015-12-12 LAB — LIPID PANEL
CHOL/HDL RATIO: 2.8 ratio
Cholesterol: 110 mg/dL (ref 0–200)
HDL: 40 mg/dL — AB (ref >40)
LDL Cholesterol: 16 mg/dL (ref 0–99)
TRIGLYCERIDES: 271 mg/dL — AB (ref <150)
VLDL: 54 mg/dL — ABNORMAL HIGH (ref 0–40)

## 2015-12-12 MED ORDER — OXYCODONE HCL 5 MG PO TABS: 5.0000 mg | ORAL_TABLET | ORAL | 0 refills | Status: DC | PRN

## 2015-12-12 MED ORDER — DEXAMETHASONE 4 MG PO TABS: 4.0000 mg | ORAL_TABLET | Freq: Two times a day (BID) | ORAL | Status: DC

## 2015-12-12 MED ORDER — PROMETHAZINE HCL 12.5 MG PO TABS: 12.5000 mg | ORAL_TABLET | Freq: Four times a day (QID) | ORAL | 0 refills | Status: DC | PRN

## 2015-12-12 MED ORDER — ASPIRIN 81 MG PO TBEC: 81.0000 mg | DELAYED_RELEASE_TABLET | Freq: Every day | ORAL | 0 refills | Status: DC

## 2015-12-12 MED ORDER — DEXAMETHASONE 4 MG PO TABS: 4.0000 mg | ORAL_TABLET | Freq: Every day | ORAL | 0 refills | Status: AC

## 2015-12-12 NOTE — Care Management Note (Signed)
Case Management Note  Patient Details  Name: Marie [name] MRN: [id number] Date of Birth: 09-24-1955  Subjective/Objective:      Discharged home with [place] order faxed to [facility]               Action/Plan:   Expected Discharge Date:                  Expected Discharge Plan:     In-House Referral:     Discharge planning Services     Post Acute Care Choice:    Choice offered to:     DME Arranged:    DME Agency:     HH Arranged:    [place] Agency:     Status of Service:     If discussed at [employer] of Stay Meetings, dates discussed:    Additional Comments:  Demitrius Crass A, RN 12/12/2015, 4:23 PM

## 2015-12-12 NOTE — Progress Notes (Signed)
Received MD order to discharge patient to home, discharge instructions home meds, follow up appointments and prescriptions were reviewed with patient and patient verbalized understanding, discharged in wheelchair with boyfriend to home

## 2015-12-12 NOTE — Care Management Note (Addendum)
Case Management Note  Patient Details  Name: Marie [name] MRN: [id number] Date of Birth: [birth year]  Subjective/Objective:      Referral faxed to [facility] for PT, RN, Aid. Marie [name] is open to [facility].  Call to son Marie [name] but phone not working. Message requesting a call back on phone of friend Marie [name] [phone].            Action/Plan:   Expected Discharge Date:                  Expected Discharge Plan:     In-House Referral:     Discharge planning Services     Post Acute Care Choice:    Choice offered to:     DME Arranged:    DME Agency:     HH Arranged:    HH Agency:     Status of Service:     If discussed at [employer] of Stay Meetings, dates discussed:    Additional Comments:  Shayda Kalka A, RN 12/12/2015, 9:46 AM

## 2015-12-12 NOTE — Discharge Instructions (Signed)
Resume activity and diet as before

## 2015-12-13 NOTE — Care Management Note (Signed)
Case Management Note  Patient Details  Name: Marie [name] MRN: [id number] Date of Birth: 1955/06/20  Subjective/Objective:    Per [facility], Ms [name] is not a patient of theirs because they are out of network for her insurance. Called Ardeen Fillers at [facility] and Ms [name] is an open client of [facility]. Ardeen Fillers will resume home health care with [facility] for Ms [name].                 Action/Plan:   Expected Discharge Date:                  Expected Discharge Plan:     In-House Referral:     Discharge planning Services     Post Acute Care Choice:    Choice offered to:     DME Arranged:    DME Agency:     HH Arranged:    HH Agency:     Status of Service:     If discussed at [employer] of Stay Meetings, dates discussed:    Additional Comments:  Haydyn Girvan A, RN 12/13/2015, 3:26 PM

## 2015-12-14 ENCOUNTER — Ambulatory Visit: Payer: Medicare Other

## 2015-12-14 LAB — GLUCOSE, CAPILLARY
GLUCOSE-CAPILLARY: 173 mg/dL — AB (ref 65–99)
Glucose-Capillary: 284 mg/dL — ABNORMAL HIGH (ref 65–99)
Glucose-Capillary: 227 mg/dL — ABNORMAL HIGH (ref 65–99)
Glucose-Capillary: 317 mg/dL — ABNORMAL HIGH (ref 65–99)

## 2015-12-15 ENCOUNTER — Inpatient Hospital Stay
Admission: EM | Admit: 2015-12-15 | Discharge: 2015-12-16 | DRG: 640 | Disposition: A | Discharge status: Hospice | Payer: Medicare Other | Attending: Internal Medicine | Admitting: Internal Medicine

## 2015-12-15 ENCOUNTER — Emergency Department: Payer: Medicare Other

## 2015-12-15 ENCOUNTER — Telehealth: Payer: Self-pay | Admitting: *Deleted

## 2015-12-15 ENCOUNTER — Other Ambulatory Visit: Payer: Self-pay | Admitting: Internal Medicine

## 2015-12-15 ENCOUNTER — Ambulatory Visit: Payer: Medicare Other

## 2015-12-15 ENCOUNTER — Encounter: Payer: Self-pay | Admitting: Emergency Medicine

## 2015-12-15 ENCOUNTER — Telehealth: Payer: Self-pay | Admitting: Internal Medicine

## 2015-12-15 ENCOUNTER — Inpatient Hospital Stay: Payer: Medicare Other

## 2015-12-15 DIAGNOSIS — R4182 Altered mental status, unspecified: Secondary | ICD-10-CM | POA: Diagnosis present

## 2015-12-15 DIAGNOSIS — R918 Other nonspecific abnormal finding of lung field: Secondary | ICD-10-CM | POA: Diagnosis not present

## 2015-12-15 DIAGNOSIS — R6889 Other general symptoms and signs: Secondary | ICD-10-CM | POA: Diagnosis not present

## 2015-12-15 DIAGNOSIS — E1151 Type 2 diabetes mellitus with diabetic peripheral angiopathy without gangrene: Secondary | ICD-10-CM | POA: Diagnosis not present

## 2015-12-15 DIAGNOSIS — J9811 Atelectasis: Secondary | ICD-10-CM | POA: Diagnosis present

## 2015-12-15 DIAGNOSIS — E1142 Type 2 diabetes mellitus with diabetic polyneuropathy: Secondary | ICD-10-CM | POA: Diagnosis present

## 2015-12-15 DIAGNOSIS — Z79899 Other long term (current) drug therapy: Secondary | ICD-10-CM

## 2015-12-15 DIAGNOSIS — R531 Weakness: Secondary | ICD-10-CM | POA: Diagnosis not present

## 2015-12-15 DIAGNOSIS — G9340 Encephalopathy, unspecified: Secondary | ICD-10-CM | POA: Diagnosis not present

## 2015-12-15 DIAGNOSIS — I639 Cerebral infarction, unspecified: Secondary | ICD-10-CM | POA: Diagnosis not present

## 2015-12-15 DIAGNOSIS — G9341 Metabolic encephalopathy: Secondary | ICD-10-CM | POA: Diagnosis present

## 2015-12-15 DIAGNOSIS — R627 Adult failure to thrive: Secondary | ICD-10-CM | POA: Diagnosis present

## 2015-12-15 DIAGNOSIS — C3490 Malignant neoplasm of unspecified part of unspecified bronchus or lung: Secondary | ICD-10-CM | POA: Diagnosis not present

## 2015-12-15 DIAGNOSIS — L97529 Non-pressure chronic ulcer of other part of left foot with unspecified severity: Secondary | ICD-10-CM | POA: Diagnosis present

## 2015-12-15 DIAGNOSIS — J449 Chronic obstructive pulmonary disease, unspecified: Secondary | ICD-10-CM | POA: Diagnosis not present

## 2015-12-15 DIAGNOSIS — C7931 Secondary malignant neoplasm of brain: Secondary | ICD-10-CM | POA: Diagnosis present

## 2015-12-15 DIAGNOSIS — Z515 Encounter for palliative care: Secondary | ICD-10-CM | POA: Diagnosis present

## 2015-12-15 DIAGNOSIS — F17210 Nicotine dependence, cigarettes, uncomplicated: Secondary | ICD-10-CM | POA: Diagnosis present

## 2015-12-15 DIAGNOSIS — R51 Headache: Secondary | ICD-10-CM | POA: Diagnosis not present

## 2015-12-15 DIAGNOSIS — Z789 Other specified health status: Secondary | ICD-10-CM | POA: Diagnosis not present

## 2015-12-15 DIAGNOSIS — C787 Secondary malignant neoplasm of liver and intrahepatic bile duct: Secondary | ICD-10-CM | POA: Diagnosis present

## 2015-12-15 DIAGNOSIS — E11621 Type 2 diabetes mellitus with foot ulcer: Secondary | ICD-10-CM | POA: Diagnosis not present

## 2015-12-15 DIAGNOSIS — C7951 Secondary malignant neoplasm of bone: Secondary | ICD-10-CM | POA: Diagnosis present

## 2015-12-15 DIAGNOSIS — Z888 Allergy status to other drugs, medicaments and biological substances status: Secondary | ICD-10-CM

## 2015-12-15 DIAGNOSIS — Z794 Long term (current) use of insulin: Secondary | ICD-10-CM

## 2015-12-15 DIAGNOSIS — T380X5A Adverse effect of glucocorticoids and synthetic analogues, initial encounter: Secondary | ICD-10-CM | POA: Diagnosis not present

## 2015-12-15 DIAGNOSIS — Z7952 Long term (current) use of systemic steroids: Secondary | ICD-10-CM

## 2015-12-15 DIAGNOSIS — E860 Dehydration: Principal | ICD-10-CM | POA: Diagnosis present

## 2015-12-15 DIAGNOSIS — R0902 Hypoxemia: Secondary | ICD-10-CM

## 2015-12-15 DIAGNOSIS — R001 Bradycardia, unspecified: Secondary | ICD-10-CM | POA: Diagnosis present

## 2015-12-15 DIAGNOSIS — C3411 Malignant neoplasm of upper lobe, right bronchus or lung: Secondary | ICD-10-CM | POA: Diagnosis not present

## 2015-12-15 DIAGNOSIS — D72829 Elevated white blood cell count, unspecified: Secondary | ICD-10-CM | POA: Diagnosis present

## 2015-12-15 DIAGNOSIS — Z8249 Family history of ischemic heart disease and other diseases of the circulatory system: Secondary | ICD-10-CM

## 2015-12-15 DIAGNOSIS — Z923 Personal history of irradiation: Secondary | ICD-10-CM

## 2015-12-15 DIAGNOSIS — I509 Heart failure, unspecified: Secondary | ICD-10-CM | POA: Diagnosis present

## 2015-12-15 DIAGNOSIS — Z66 Do not resuscitate: Secondary | ICD-10-CM | POA: Diagnosis present

## 2015-12-15 DIAGNOSIS — Z7951 Long term (current) use of inhaled steroids: Secondary | ICD-10-CM

## 2015-12-15 DIAGNOSIS — R109 Unspecified abdominal pain: Secondary | ICD-10-CM | POA: Diagnosis not present

## 2015-12-15 DIAGNOSIS — G893 Neoplasm related pain (acute) (chronic): Secondary | ICD-10-CM | POA: Diagnosis not present

## 2015-12-15 DIAGNOSIS — I110 Hypertensive heart disease with heart failure: Secondary | ICD-10-CM | POA: Diagnosis not present

## 2015-12-15 DIAGNOSIS — Z7982 Long term (current) use of aspirin: Secondary | ICD-10-CM

## 2015-12-15 DIAGNOSIS — G936 Cerebral edema: Secondary | ICD-10-CM | POA: Diagnosis present

## 2015-12-15 DIAGNOSIS — Z833 Family history of diabetes mellitus: Secondary | ICD-10-CM

## 2015-12-15 DIAGNOSIS — N179 Acute kidney failure, unspecified: Secondary | ICD-10-CM | POA: Diagnosis not present

## 2015-12-15 DIAGNOSIS — Z9989 Dependence on other enabling machines and devices: Secondary | ICD-10-CM | POA: Diagnosis not present

## 2015-12-15 DIAGNOSIS — Z9861 Coronary angioplasty status: Secondary | ICD-10-CM

## 2015-12-15 DIAGNOSIS — Z880 Allergy status to penicillin: Secondary | ICD-10-CM

## 2015-12-15 LAB — CBC WITH DIFFERENTIAL/PLATELET
BASOS ABS: 0.1 10*3/uL (ref 0–0.1)
Basophils Relative: 0 %
Eosinophils Absolute: 0.4 10*3/uL (ref 0–0.7)
Eosinophils Relative: 2 %
HEMATOCRIT: 37.7 % (ref 35.0–47.0)
Hemoglobin: 12.6 g/dL (ref 12.0–16.0)
LYMPHS PCT: 4 %
Lymphs Abs: 0.9 10*3/uL — ABNORMAL LOW (ref 1.0–3.6)
MCH: 29 pg (ref 26.0–34.0)
MCHC: 33.5 g/dL (ref 32.0–36.0)
MCV: 86.7 fL (ref 80.0–100.0)
Monocytes Absolute: 0.4 10*3/uL (ref 0.2–0.9)
Monocytes Relative: 2 %
NEUTROS ABS: 20 10*3/uL — AB (ref 1.4–6.5)
NEUTROS PCT: 92 %
Platelets: 213 10*3/uL (ref 150–440)
RBC: 4.35 MIL/uL (ref 3.80–5.20)
RDW: 15.4 % — ABNORMAL HIGH (ref 11.5–14.5)
WBC: 21.8 10*3/uL — AB (ref 3.6–11.0)

## 2015-12-15 LAB — COMPREHENSIVE METABOLIC PANEL
ALT: 55 U/L — AB (ref 14–54)
AST: 54 U/L — AB (ref 15–41)
Albumin: 2.2 g/dL — ABNORMAL LOW (ref 3.5–5.0)
Alkaline Phosphatase: 386 U/L — ABNORMAL HIGH (ref 38–126)
Anion gap: 14 (ref 5–15)
BILIRUBIN TOTAL: 0.5 mg/dL (ref 0.3–1.2)
BUN: 55 mg/dL — AB (ref 6–20)
CHLORIDE: 96 mmol/L — AB (ref 101–111)
CO2: 23 mmol/L (ref 22–32)
CREATININE: 1.44 mg/dL — AB (ref 0.44–1.00)
Calcium: 8.2 mg/dL — ABNORMAL LOW (ref 8.9–10.3)
GFR calc Af Amer: 45 mL/min — ABNORMAL LOW (ref >60)
GFR, EST NON AFRICAN AMERICAN: 39 mL/min — AB (ref >60)
GLUCOSE: 299 mg/dL — AB (ref 65–99)
Potassium: 4.8 mmol/L (ref 3.5–5.1)
Sodium: 133 mmol/L — ABNORMAL LOW (ref 135–145)
TOTAL PROTEIN: 7.1 g/dL (ref 6.5–8.1)

## 2015-12-15 LAB — URINALYSIS COMPLETE WITH MICROSCOPIC (ARMC ONLY)
Bilirubin Urine: NEGATIVE
Glucose, UA: NEGATIVE mg/dL
Hgb urine dipstick: NEGATIVE
Ketones, ur: NEGATIVE mg/dL
LEUKOCYTES UA: NEGATIVE
Nitrite: NEGATIVE
PH: 5 (ref 5.0–8.0)
PROTEIN: 30 mg/dL — AB
SPECIFIC GRAVITY, URINE: 1.014 (ref 1.005–1.030)

## 2015-12-15 LAB — LIPASE, BLOOD: LIPASE: 13 U/L (ref 11–51)

## 2015-12-15 LAB — LACTIC ACID, PLASMA: Lactic Acid, Venous: 1.5 mmol/L (ref 0.5–1.9)

## 2015-12-15 LAB — AMMONIA: AMMONIA: 24 umol/L (ref 9–35)

## 2015-12-15 LAB — GLUCOSE, CAPILLARY: GLUCOSE-CAPILLARY: 270 mg/dL — AB (ref 65–99)

## 2015-12-15 MED ORDER — ROSUVASTATIN CALCIUM 20 MG PO TABS
10.0000 mg | ORAL_TABLET | Freq: Every day | ORAL | Status: DC
  Administered 2015-12-15: 23:00:00 10 mg via ORAL
  Filled 2015-12-15 (×2): qty 1

## 2015-12-15 MED ORDER — DULOXETINE HCL 60 MG PO CPEP
60.0000 mg | ORAL_CAPSULE | Freq: Every day | ORAL | Status: DC
  Administered 2015-12-16: 60 mg via ORAL
  Filled 2015-12-15: qty 1

## 2015-12-15 MED ORDER — MORPHINE SULFATE (PF) 4 MG/ML IV SOLN
INTRAVENOUS | Status: AC
  Administered 2015-12-15: 4 mg via INTRAVENOUS
  Filled 2015-12-15: qty 1

## 2015-12-15 MED ORDER — MORPHINE SULFATE (PF) 4 MG/ML IV SOLN
4.0000 mg | Freq: Once | INTRAVENOUS | Status: AC
  Administered 2015-12-15: 4 mg via INTRAVENOUS

## 2015-12-15 MED ORDER — METFORMIN HCL 500 MG PO TABS
500.0000 mg | ORAL_TABLET | Freq: Two times a day (BID) | ORAL | Status: DC
  Administered 2015-12-16 (×2): 500 mg via ORAL
  Filled 2015-12-15 (×2): qty 1

## 2015-12-15 MED ORDER — INSULIN ASPART 100 UNIT/ML ~~LOC~~ SOLN
0.0000 [IU] | Freq: Three times a day (TID) | SUBCUTANEOUS | Status: DC
  Administered 2015-12-16: 1 [IU] via SUBCUTANEOUS
  Filled 2015-12-15: qty 1

## 2015-12-15 MED ORDER — ACETAMINOPHEN 325 MG PO TABS: 650.0000 mg | ORAL_TABLET | Freq: Four times a day (QID) | ORAL | Status: DC | PRN

## 2015-12-15 MED ORDER — DIPHENHYDRAMINE HCL 25 MG PO CAPS
25.0000 mg | ORAL_CAPSULE | Freq: Every day | ORAL | Status: DC
  Administered 2015-12-15: 23:00:00 25 mg via ORAL
  Filled 2015-12-15: qty 1

## 2015-12-15 MED ORDER — ACETAMINOPHEN 650 MG RE SUPP: 650.0000 mg | Freq: Four times a day (QID) | RECTAL | Status: DC | PRN

## 2015-12-15 MED ORDER — ASPIRIN EC 81 MG PO TBEC
81.0000 mg | DELAYED_RELEASE_TABLET | Freq: Every day | ORAL | Status: DC
  Administered 2015-12-16: 81 mg via ORAL
  Filled 2015-12-15 (×2): qty 1

## 2015-12-15 MED ORDER — ALPRAZOLAM 0.25 MG PO TABS
0.2500 mg | ORAL_TABLET | Freq: Two times a day (BID) | ORAL | Status: DC
  Administered 2015-12-15: 23:00:00 0.25 mg via ORAL
  Filled 2015-12-15 (×2): qty 1

## 2015-12-15 MED ORDER — ONDANSETRON HCL 4 MG/2ML IJ SOLN
4.0000 mg | Freq: Four times a day (QID) | INTRAMUSCULAR | Status: DC | PRN
  Administered 2015-12-16: 4 mg via INTRAVENOUS
  Filled 2015-12-15: qty 2

## 2015-12-15 MED ORDER — LEVETIRACETAM 500 MG PO TABS
500.0000 mg | ORAL_TABLET | Freq: Two times a day (BID) | ORAL | Status: DC
  Administered 2015-12-15 – 2015-12-16 (×2): 500 mg via ORAL
  Filled 2015-12-15 (×2): qty 1

## 2015-12-15 MED ORDER — GEMFIBROZIL 600 MG PO TABS: 600.0000 mg | ORAL_TABLET | Freq: Two times a day (BID) | ORAL | Status: DC

## 2015-12-15 MED ORDER — SODIUM CHLORIDE 0.9 % IV SOLN
1000.0000 mL | Freq: Once | INTRAVENOUS | Status: AC
  Administered 2015-12-15: 1000 mL via INTRAVENOUS

## 2015-12-15 MED ORDER — ONDANSETRON HCL 4 MG/2ML IJ SOLN
4.0000 mg | Freq: Once | INTRAMUSCULAR | Status: AC
  Administered 2015-12-15: 4 mg via INTRAVENOUS

## 2015-12-15 MED ORDER — INSULIN GLARGINE 100 UNIT/ML ~~LOC~~ SOLN
20.0000 [IU] | Freq: Every day | SUBCUTANEOUS | Status: DC
  Administered 2015-12-15: 20 [IU] via SUBCUTANEOUS
  Filled 2015-12-15 (×2): qty 0.2

## 2015-12-15 MED ORDER — ENOXAPARIN SODIUM 40 MG/0.4ML ~~LOC~~ SOLN
40.0000 mg | SUBCUTANEOUS | Status: DC
  Administered 2015-12-15: 23:00:00 40 mg via SUBCUTANEOUS
  Filled 2015-12-15: qty 0.4

## 2015-12-15 MED ORDER — PANTOPRAZOLE SODIUM 40 MG PO TBEC
40.0000 mg | DELAYED_RELEASE_TABLET | Freq: Every day | ORAL | Status: DC
  Filled 2015-12-15: qty 1

## 2015-12-15 MED ORDER — DEXAMETHASONE 4 MG PO TABS
4.0000 mg | ORAL_TABLET | Freq: Every day | ORAL | Status: DC
  Administered 2015-12-16: 4 mg via ORAL
  Filled 2015-12-15: qty 1

## 2015-12-15 MED ORDER — ONDANSETRON HCL 4 MG/2ML IJ SOLN
INTRAMUSCULAR | Status: AC
  Administered 2015-12-15: 4 mg via INTRAVENOUS
  Filled 2015-12-15: qty 2

## 2015-12-15 MED ORDER — ONDANSETRON HCL 4 MG PO TABS
4.0000 mg | ORAL_TABLET | Freq: Four times a day (QID) | ORAL | Status: DC | PRN
  Administered 2015-12-15: 23:00:00 4 mg via ORAL
  Filled 2015-12-15: qty 1

## 2015-12-15 MED ORDER — PREGABALIN 75 MG PO CAPS
150.0000 mg | ORAL_CAPSULE | Freq: Two times a day (BID) | ORAL | Status: DC
  Administered 2015-12-15 – 2015-12-16 (×2): 150 mg via ORAL
  Filled 2015-12-15 (×2): qty 2

## 2015-12-15 MED ORDER — CYCLOBENZAPRINE HCL 10 MG PO TABS: 10.0000 mg | ORAL_TABLET | Freq: Three times a day (TID) | ORAL | Status: DC | PRN

## 2015-12-15 MED ORDER — INSULIN ASPART 100 UNIT/ML ~~LOC~~ SOLN
0.0000 [IU] | Freq: Every day | SUBCUTANEOUS | Status: DC
  Administered 2015-12-15: 23:00:00 3 [IU] via SUBCUTANEOUS
  Filled 2015-12-15: qty 3

## 2015-12-15 MED ORDER — MOMETASONE FURO-FORMOTEROL FUM 200-5 MCG/ACT IN AERO
2.0000 | INHALATION_SPRAY | Freq: Two times a day (BID) | RESPIRATORY_TRACT | Status: DC
  Administered 2015-12-15 – 2015-12-16 (×2): 2 via RESPIRATORY_TRACT
  Filled 2015-12-15: qty 8.8

## 2015-12-15 MED ORDER — SODIUM CHLORIDE 0.9 % IV SOLN
INTRAVENOUS | Status: DC
  Administered 2015-12-15: 22:00:00 via INTRAVENOUS

## 2015-12-15 MED ORDER — SENNOSIDES-DOCUSATE SODIUM 8.6-50 MG PO TABS: 1.0000 | ORAL_TABLET | Freq: Every day | ORAL | Status: DC | PRN

## 2015-12-15 NOTE — Telephone Encounter (Signed)
Spoke to patient's significant other- Radio producer- patient feeling weak all over complains of chronic pain patient has been confused. He feels that she is progressively getting weaker; he is unable to take care of her. Recommend taking 2 ER for further evaluation.   Given the significant decline in the performance status/multiple visits to the hospital- hospice/palliative care would be reasonable. Discussed with Mr.Myatt. He is interested in hospice.

## 2015-12-15 NOTE — ED Triage Notes (Signed)
C/O lower abdominal pain x 1 day.  On EMS arrival, RA Sats in the low 80's and patient diaphoretic.  Patient placed on 100% NRB and given 300 cc NS bolus for blood pressure 100/50   Patient recently diagnosed with brain CA and is receiving radiation therapy.  Also,l recently hospitalized for c/o chest pain, d/c from hospital Saturday.

## 2015-12-15 NOTE — H&P (Signed)
[facility] NAME: Marie [name]    MR#:  [id number]  DATE OF BIRTH:  [birth year]  DATE OF ADMISSION:  12/15/2015  PRIMARY CARE PHYSICIAN: Cletis Athens, MD   REQUESTING/REFERRING PHYSICIAN: Dr. Nance Pear  CHIEF COMPLAINT:   Chief Complaint  Patient presents with  . Abdominal Pain    HISTORY OF PRESENT ILLNESS:  Marie [name]  is a 60 y.o. female with a known history of COPD, CHF, diabetes mellitus, peripheral neuropathy, peripheral arterial disease, new diagnosis of and no carcinoma of lung, with metastases to brain and bones presents to hospital secondary to worsening weakness and confusion. Patient was recently here in the hospital last week and was diagnosed with brain metastases and also acute infarcts. Received whole brain radiation and was treated for UTI and sepsis. She is on narcotics for worsening pain in her hip. She was discharged home, however significant other is unable to take care of the patient at home. She continues to be confused, extremely weak and only in a wheelchair at this time. No seizure-like activity noted. Patient is very confused at this time and is unable to provide any history. She is arousable and follows some simple commands. She remains on 2 L oxygen currently. Labs indicate elevated white count. But she is also on Decadron daily. She has been complaining of nausea and vomiting and abdominal cramping today. Oncology office was called and they have recommended that patient can come to the emergency room.  PAST MEDICAL HISTORY:   Past Medical History:  Diagnosis Date  . Arthritis   . Cancer ([place])    Lung CA right wedge removed  . CHF (congestive heart failure) ([place])   . COPD (chronic obstructive pulmonary disease) ([facility])    has oxygen but doesn't use  . Diabetes mellitus type 2 with neurological manifestations ([place])    Peripheral neuropathy and foot ulcers  . Hypertension   . PAD (peripheral  artery disease) ([place])   . Peripheral neuropathy ([place])   . Shortness of breath dyspnea     PAST SURGICAL HISTORY:   Past Surgical History:  Procedure Laterality Date  . ABDOMINAL HYSTERECTOMY    . BACK SURGERY  x 7  . CARPAL TUNNEL RELEASE Bilateral   . CESAREAN SECTION     x3  . CORONARY ANGIOPLASTY     no stents  . FOOT SURGERY Left    diabetic ulcer  . KNEE ARTHROSCOPY Bilateral   . TONSILLECTOMY    . VIDEO ASSISTED THORACOSCOPY (VATS)/THOROCOTOMY Right 08/26/2014   Procedure: VIDEO ASSISTED THORACOSCOPY (VATS)/THOROCOTOMY;  Surgeon: Nestor Lewandowsky, MD;  Location: [facility] ORS;  Service: General;  Laterality: Right;    SOCIAL HISTORY:   Social History  Substance Use Topics  . Smoking status: Current Every Day Smoker    Packs/day: 0.50    Years: 46.00    Types: Cigarettes  . Smokeless tobacco: Never Used  . Alcohol use No    FAMILY HISTORY:   Family History  Problem Relation Age of Onset  . Hypertension Brother   . Coronary artery disease Mother   . Diabetes Mellitus II Mother   . Coronary artery disease Father   . Diabetes Mellitus II Father   . Diabetes Mellitus II Brother     DRUG ALLERGIES:   Allergies  Allergen Reactions  . Ampicillin Anaphylaxis  . Penicillins Anaphylaxis and Shortness Of Breath    Has patient had a PCN reaction causing immediate rash, facial/tongue/throat swelling, SOB  or lightheadedness with hypotension: yes Has patient had a PCN reaction causing severe rash involving mucus membranes or skin necrosis: no Has patient had a PCN reaction that required hospitalization no Has patient had a PCN reaction occurring within the last 10 years: no If all of the above answers are "NO", then may proceed with Cephalosporin use.   . Avelox [Moxifloxacin Hcl In Nacl] Other (See Comments)    Reaction: MUSCLE CRAMPS  . Etodolac Rash  . Neurontin [Gabapentin] Anxiety    REVIEW OF SYSTEMS:   Review of Systems  Unable to perform ROS: Mental status  change    MEDICATIONS AT HOME:   Prior to Admission medications   Medication Sig Start Date End Date Taking? Authorizing Provider  ALPRAZolam (XANAX) 0.25 MG tablet Take 1 tablet (0.25 mg total) by mouth 2 (two) times daily. 11/18/15  Yes Cammie Sickle, MD  aspirin EC 81 MG EC tablet Take 1 tablet (81 mg total) by mouth daily. 12/12/15  Yes Srikar Sudini, MD  budesonide-formoterol (SYMBICORT) 160-4.5 MCG/ACT inhaler Inhale 2 puffs into the lungs 2 (two) times daily. Reported on 05/20/2015    Historical Provider, MD  cyclobenzaprine (FLEXERIL) 10 MG tablet Take 10 mg by mouth 3 (three) times daily.     Historical Provider, MD  dexamethasone (DECADRON) 4 MG tablet Take 1 tablet (4 mg total) by mouth daily. 12/12/15   Srikar Sudini, MD  diazepam (VALIUM) 5 MG tablet Take 2 mg by mouth daily.  08/15/14   Historical Provider, MD  diphenhydrAMINE (BENADRYL) 25 MG tablet Take 25 mg by mouth at bedtime.    Historical Provider, MD  DULoxetine (CYMBALTA) 60 MG capsule Take 60 mg by mouth daily.    Historical Provider, MD  gemfibrozil (LOPID) 600 MG tablet Take 600 mg by mouth 2 (two) times daily before a meal.    Historical Provider, MD  glipiZIDE (GLUCOTROL) 5 MG tablet Take 5 mg by mouth 2 (two) times daily before a meal.  06/26/14   Historical Provider, MD  ibuprofen (ADVIL,MOTRIN) 100 MG tablet Take 100 mg by mouth every 6 (six) hours as needed for fever.    Historical Provider, MD  Insulin Glargine (TOUJEO SOLOSTAR) 300 UNIT/ML SOPN Inject 50 Units into the skin daily. 12/07/15   Henreitta Leber, MD  levETIRAcetam (KEPPRA) 500 MG tablet Take 1 tablet by mouth 2 (two) times daily. 12/01/15   Historical Provider, MD  LYRICA 150 MG capsule Take 150 mg by mouth 2 (two) times daily.  10/29/15   Historical Provider, MD  metFORMIN (GLUCOPHAGE) 1000 MG tablet Take 500 mg by mouth 2 (two) times daily with a meal.  05/19/15   Historical Provider, MD  oxyCODONE (OXY IR/ROXICODONE) 5 MG immediate release tablet Take 1  tablet (5 mg total) by mouth every 4 (four) hours as needed for breakthrough pain. 1-2 tabs every 4 hours as needed for severe pain 12/12/15   Hillary Bow, MD  pantoprazole (PROTONIX) 40 MG tablet Take 40 mg by mouth daily.  10/28/15 10/27/16  Historical Provider, MD  promethazine (PHENERGAN) 12.5 MG tablet Take 1 tablet (12.5 mg total) by mouth every 6 (six) hours as needed for nausea or vomiting. 12/12/15   Srikar Sudini, MD  rosuvastatin (CRESTOR) 10 MG tablet Take 10 mg by mouth daily.    Historical Provider, MD  senna-docusate (SENOKOT-S) 8.6-50 MG tablet Take 1 tablet by mouth daily as needed for mild constipation.  10/28/15 10/27/16  Historical Provider, MD  sulfamethoxazole-trimethoprim (BACTRIM DS,SEPTRA DS)  800-160 MG tablet Take 1 tablet by mouth 2 (two) times daily. 12/07/15   Henreitta Leber, MD      VITAL SIGNS:  Blood pressure 97/64, pulse 64, temperature 97.6 F (36.4 C), temperature source Oral, resp. rate 16, height '5\' 2"'$  (1.575 m), weight 85.3 kg (188 lb), SpO2 (!) 88 %.  PHYSICAL EXAMINATION:   Physical Exam  GENERAL:  60 y.o.-year-old patient lying in the bed with no acute distress. Tossing and turning in bed. EYES: Pupils equal, round, reactive to light and accommodation. No scleral icterus. Extraocular muscles intact.  HEENT: Head atraumatic, normocephalic. Oropharynx and nasopharynx clear.  NECK:  Supple, no jugular venous distention. No thyroid enlargement, no tenderness.  LUNGS: Normal breath sounds bilaterally, no wheezing, rales,rhonchi or crepitation. No use of accessory muscles of respiration. Decreased bibasilar breath sounds. CARDIOVASCULAR: S1, S2 normal. No murmurs, rubs, or gallops.  ABDOMEN: Soft, diffuse tenderness without any guarding or rigidity, nondistended. Bowel sounds present. No organomegaly or mass.  EXTREMITIES: No pedal edema, cyanosis, or clubbing.  NEUROLOGIC: Arousable and following some simple commands. No focal weakness noted. Unable to do a  complete neurological exam given her mental status.  PSYCHIATRIC: The patient is alert and not oriented.  SKIN: No obvious rash, lesion, or ulcer.   LABORATORY PANEL:   CBC  Recent Labs Lab 12/15/15 1509  WBC 21.8*  HGB 12.6  HCT 37.7  PLT 213   ------------------------------------------------------------------------------------------------------------------  Chemistries   Recent Labs Lab 12/15/15 1509  NA 133*  K 4.8  CL 96*  CO2 23  GLUCOSE 299*  BUN 55*  CREATININE 1.44*  CALCIUM 8.2*  AST 54*  ALT 55*  ALKPHOS 386*  BILITOT 0.5   ------------------------------------------------------------------------------------------------------------------  Cardiac Enzymes No results for input(s): TROPONINI in the last 168 hours. ------------------------------------------------------------------------------------------------------------------  RADIOLOGY:  Dg Chest 2 View  Result Date: 12/15/2015 CLINICAL DATA:  60 year old female presenting with hypoxia. History of brain cancer and lung cancer. EXAM: CHEST  2 VIEW COMPARISON:  Chest radiograph dated 12/02/2015 and chest CT dated 09/25/2014 cyst in FINDINGS: There has been interval removal of the previously seen right IJ central line. Interval resolution of the previously seen pulmonary edema. Bibasilar hazy densities, likely atelectatic changes. Right infrahilar opacity similar to prior study and likely corresponding to the postsurgical scarring. There is no pleural effusion or pneumothorax. The cardiac silhouette is within normal limits. No acute osseous pathology. IMPRESSION: Interval removal of the right IJ central line and resolution of the previously seen pulmonary edema. Bibasilar subsegmental atelectatic changes. Stable right infrahilar opacity corresponds to the right lower lobe postsurgical changes. Electronically Signed   By: Anner Crete M.D.   On: 12/15/2015 18:37    EKG:   Orders placed or performed during the  hospital encounter of 12/08/15  . ED EKG  . ED EKG    IMPRESSION AND PLAN:   Sarah Baez  is a 60 y.o. female with a known history of COPD, CHF, diabetes mellitus, peripheral neuropathy, peripheral arterial disease, new diagnosis of and no carcinoma of lung, with metastases to brain and bones presents to hospital secondary to worsening weakness and confusion.  #1 encephalopathy-could be metabolic. -Known history of brain metastases, repeat CT head today. -Check ammonia level. -Hold pain medications. - neuro checks  -Wbc's elevated, could be from Decadron. Check urine analysis to rule out infection. Chest x-ray with atelectasis. -If worsening intra-abdominal count and patient continues to complain of abdominal pain, then we'll order a CT of her abdomen.  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 hold Bactrim.  #6 DVT prophylaxis-on Lovenox  Poor prognosis, palliative care consulted.   All the records are reviewed and case discussed with ED provider. Management plans discussed with the patient, family and they are in agreement.  CODE STATUS: Full code  TOTAL TIME TAKING CARE OF THIS PATIENT: 50 minutes.    Gladstone Lighter M.D on 12/15/2015 at 8:09 PM  Between 7am to 6pm - Pager - [phone]  After 6pm go to [email or web address] - password [email or web address] [facility]  Office  [phone]  CC: Primary care physician; Cletis Athens, MD

## 2015-12-15 NOTE — Telephone Encounter (Signed)
She was discharged from hospital and he needs assistance with her, home health referral, social worker, She is falling and he is unable to pick her up. Please advise. She has no fu appt scheduled with you

## 2015-12-15 NOTE — ED Provider Notes (Signed)
[facility] Emergency Department Provider Note   ____________________________________________    I have reviewed the triage vital signs and the nursing notes.   HISTORY  Chief Complaint Abdominal Pain     HPI Marie [name] is a 60 y.o. female who presents with n/v and abdominal cramping. Recent admissions for similar. She has a hx of brain mets and follows with dr. Rogue Bussing. She denies fevers/chills. She describes intractable n/v as her primary complaint. This was improved at last discharge.    Past Medical History:  Diagnosis Date  . Arthritis   . Cancer ([place])    Lung CA right wedge removed  . CHF (congestive heart failure) ([place])   . COPD (chronic obstructive pulmonary disease) ([facility])    has oxygen but doesn't use  . Diabetes mellitus type 2 with neurological manifestations ([place])    Peripheral neuropathy and foot ulcers  . Hypertension   . PAD (peripheral artery disease) ([place])   . Peripheral neuropathy ([place])   . Shortness of breath dyspnea     Patient Active Problem List   Diagnosis Date Noted  . Acute CVA (cerebrovascular accident) ([place]) 12/11/2015  . DNR (do not resuscitate) 12/10/2015  . Cancer associated pain 12/10/2015  . Palliative care by specialist 12/10/2015  . Weakness   . Intractable nausea and vomiting 12/08/2015  . Acute encephalopathy 12/02/2015  . AKI (acute kidney injury) ([place]) 12/02/2015  . Severe sepsis ([place]) 12/02/2015  . UTI (lower urinary tract infection) 12/02/2015  . Poorly controlled diabetes mellitus ([place]) 11/18/2015  . Brain metastases ([place]) 11/04/2015  . Osteomyelitis ([place]) 11/01/2014  . Malignant neoplasm of lower lobe of right lung ([place]) 10/02/2014    Past Surgical History:  Procedure Laterality Date  . ABDOMINAL HYSTERECTOMY    . BACK SURGERY  x 7  . CARPAL TUNNEL RELEASE Bilateral   . CESAREAN SECTION     x3  . CORONARY ANGIOPLASTY     no stents  . FOOT SURGERY Left    diabetic ulcer  .  KNEE ARTHROSCOPY Bilateral   . TONSILLECTOMY    . VIDEO ASSISTED THORACOSCOPY (VATS)/THOROCOTOMY Right 08/26/2014   Procedure: VIDEO ASSISTED THORACOSCOPY (VATS)/THOROCOTOMY;  Surgeon: Nestor Lewandowsky, MD;  Location: [facility] ORS;  Service: General;  Laterality: Right;    Prior to Admission medications   Medication Sig Start Date End Date Taking? Authorizing Provider  ALPRAZolam (XANAX) 0.25 MG tablet Take 1 tablet (0.25 mg total) by mouth 2 (two) times daily. 11/18/15   Cammie Sickle, MD  aspirin EC 81 MG EC tablet Take 1 tablet (81 mg total) by mouth daily. 12/12/15   Srikar Sudini, MD  budesonide-formoterol (SYMBICORT) 160-4.5 MCG/ACT inhaler Inhale 2 puffs into the lungs 2 (two) times daily. Reported on 05/20/2015    Historical Provider, MD  cyclobenzaprine (FLEXERIL) 10 MG tablet Take 10 mg by mouth 3 (three) times daily.     Historical Provider, MD  dexamethasone (DECADRON) 4 MG tablet Take 1 tablet (4 mg total) by mouth daily. 12/12/15   Srikar Sudini, MD  diazepam (VALIUM) 5 MG tablet Take 2 mg by mouth daily.  08/15/14   Historical Provider, MD  diphenhydrAMINE (BENADRYL) 25 MG tablet Take 25 mg by mouth at bedtime.    Historical Provider, MD  DULoxetine (CYMBALTA) 60 MG capsule Take 60 mg by mouth daily.    Historical Provider, MD  gemfibrozil (LOPID) 600 MG tablet Take 600 mg by mouth 2 (two) times daily before a meal.  Historical Provider, MD  glipiZIDE (GLUCOTROL) 5 MG tablet Take 5 mg by mouth 2 (two) times daily before a meal.  06/26/14   Historical Provider, MD  ibuprofen (ADVIL,MOTRIN) 100 MG tablet Take 100 mg by mouth every 6 (six) hours as needed for fever.    Historical Provider, MD  Insulin Glargine (TOUJEO SOLOSTAR) 300 UNIT/ML SOPN Inject 50 Units into the skin daily. 12/07/15   Henreitta Leber, MD  levETIRAcetam (KEPPRA) 500 MG tablet Take 1 tablet by mouth 2 (two) times daily. 12/01/15   Historical Provider, MD  LYRICA 150 MG capsule Take 150 mg by mouth 2 (two) times daily.   10/29/15   Historical Provider, MD  metFORMIN (GLUCOPHAGE) 1000 MG tablet Take 500 mg by mouth 2 (two) times daily with a meal.  05/19/15   Historical Provider, MD  oxyCODONE (OXY IR/ROXICODONE) 5 MG immediate release tablet Take 1 tablet (5 mg total) by mouth every 4 (four) hours as needed for breakthrough pain. 1-2 tabs every 4 hours as needed for severe pain 12/12/15   Hillary Bow, MD  pantoprazole (PROTONIX) 40 MG tablet Take 40 mg by mouth daily.  10/28/15 10/27/16  Historical Provider, MD  promethazine (PHENERGAN) 12.5 MG tablet Take 1 tablet (12.5 mg total) by mouth every 6 (six) hours as needed for nausea or vomiting. 12/12/15   Srikar Sudini, MD  rosuvastatin (CRESTOR) 10 MG tablet Take 10 mg by mouth daily.    Historical Provider, MD  senna-docusate (SENOKOT-S) 8.6-50 MG tablet Take 1 tablet by mouth daily as needed for mild constipation.  10/28/15 10/27/16  Historical Provider, MD  sulfamethoxazole-trimethoprim (BACTRIM DS,SEPTRA DS) 800-160 MG tablet Take 1 tablet by mouth 2 (two) times daily. 12/07/15   Henreitta Leber, MD     Allergies Ampicillin; Penicillins; Avelox [moxifloxacin hcl in nacl]; Etodolac; and Neurontin [gabapentin]  Family History  Problem Relation Age of Onset  . Hypertension Brother   . Coronary artery disease Mother   . Diabetes Mellitus II Mother   . Coronary artery disease Father   . Diabetes Mellitus II Father   . Diabetes Mellitus II Brother     Social History Social History  Substance Use Topics  . Smoking status: Current Every Day Smoker    Packs/day: 0.50    Years: 46.00    Types: Cigarettes  . Smokeless tobacco: Never Used  . Alcohol use No    Review of Systems  Constitutional: No fever/chills Eyes: No acute  visual changes.  ENT: mild sore throat from vomiting Cardiovascular: Denies chest pain. Respiratory: Denies shortness of breath. Gastrointestinal: as above Genitourinary: Negative for dysuria. Musculoskeletal: Negative for neck   pain. Skin: Negative for rash. Neurological: Negative for new focal weakness  10-point ROS otherwise negative.  ____________________________________________   PHYSICAL EXAM:  VITAL SIGNS: ED Triage Vitals  Enc Vitals Group     BP 12/15/15 1413 95/65     Pulse Rate 12/15/15 1413 66     Resp 12/15/15 1413 18     Temp 12/15/15 1413 97.6 F (36.4 C)     Temp Source 12/15/15 1413 Oral     SpO2 12/15/15 1413 93 %     Weight 12/15/15 1414 188 lb (85.3 kg)     Height 12/15/15 1414 '5\' 2"'$  (1.575 m)     Head Circumference --      Peak Flow --      Pain Score 12/15/15 1414 9     Pain Loc --  Pain Edu? --      Excl. in [place]? --     Constitutional: Alert and oriented. Chronically ill appearing Eyes: Conjunctivae are normal.  Head: Atraumatic. Nose: No congestion/rhinnorhea. Mouth/Throat: Mucous membranes are dry Neck:  Painless ROM, no meningismus Cardiovascular: Normal rate, regular rhythm. Grossly normal heart sounds.  Good peripheral circulation. Respiratory: Normal respiratory effort.  No retractions. Lungs CTAB. Gastrointestinal:Mild ttp over epigastrium. No distention.  No CVA tenderness. Genitourinary: deferred Musculoskeletal: No lower extremity tenderness nor edema.  Warm and well perfused Neurologic:  Normal speech and language. No gross focal neurologic deficits are appreciated.  Skin:  Skin is warm, dry and intact. No rash noted. Psychiatric: Mood and affect are normal. Speech and behavior are normal.  ____________________________________________   LABS (all labs ordered are listed, but only abnormal results are displayed)  Labs Reviewed  CBC WITH DIFFERENTIAL/PLATELET - Abnormal; Notable for the following:       Result Value   WBC 21.8 (*)    RDW 15.4 (*)    Neutro Abs 20.0 (*)    Lymphs Abs 0.9 (*)    All other components within normal limits  COMPREHENSIVE METABOLIC PANEL  LIPASE, BLOOD  URINALYSIS COMPLETEWITH MICROSCOPIC ([facility] ONLY)  LACTIC ACID,  PLASMA  LACTIC ACID, PLASMA   ____________________________________________  EKG  none ____________________________________________  RADIOLOGY  none ____________________________________________   PROCEDURES  Procedure(s) performed: No    Critical Care performed: No ____________________________________________   INITIAL IMPRESSION / ASSESSMENT AND PLAN / ED COURSE  Pertinent labs & imaging results that were available during my care of the patient were reviewed by me and considered in my medical decision making (see chart for details).  Patient with hx of brain mets p/w nausea/vomiting. She has presented in a similar fashion in the past with sepsis. However, today her vitals are unremarkable. We will send labs and treat with IV fluids/zofran. I suspect patient will need to be admitted for this.   I asked Dr. Archie Balboa to follow up on lab results and admit if appropriate.   Clinical Course   ____________________________________________   FINAL CLINICAL IMPRESSION(S) / ED DIAGNOSES  Intractable nausea and vomiting    NEW MEDICATIONS STARTED DURING THIS VISIT:  New Prescriptions   No medications on file     Note:  This document was prepared using Dragon voice recognition software and may include unintentional dictation errors.    Lavonia Drafts, MD 12/15/15 [phone]

## 2015-12-15 NOTE — Plan of Care (Signed)
Problem: Education: Goal: Knowledge of [facility] Education information/materials will improve Outcome: Progressing  Pt likes to be called Marie [name]  Past Medical History:  Diagnosis Date  . Arthritis   . Cancer ([name])    Lung CA right wedge removed  . CHF (congestive heart failure) ([name])   . COPD (chronic obstructive pulmonary disease) ([facility])    has oxygen but doesn't use  . Diabetes mellitus type 2 with neurological manifestations ([name])    Peripheral neuropathy and foot ulcers  . Hypertension   . PAD (peripheral artery disease) ([name])   . Peripheral neuropathy ([name])   . Shortness of breath dyspnea     Pt is well controlled with home medications

## 2015-12-15 NOTE — ED Notes (Signed)
Placed on Room Air by Dr. Archie Balboa. O2 Sats 85-86%.  Dr. Archie Balboa notified and patient returned to 2L/ [place].  Continue to monitor.

## 2015-12-16 ENCOUNTER — Ambulatory Visit: Payer: Medicare Other

## 2015-12-16 DIAGNOSIS — Z79899 Other long term (current) drug therapy: Secondary | ICD-10-CM

## 2015-12-16 DIAGNOSIS — C7931 Secondary malignant neoplasm of brain: Secondary | ICD-10-CM

## 2015-12-16 DIAGNOSIS — G629 Polyneuropathy, unspecified: Secondary | ICD-10-CM

## 2015-12-16 DIAGNOSIS — C787 Secondary malignant neoplasm of liver and intrahepatic bile duct: Secondary | ICD-10-CM

## 2015-12-16 DIAGNOSIS — I739 Peripheral vascular disease, unspecified: Secondary | ICD-10-CM

## 2015-12-16 DIAGNOSIS — Z8673 Personal history of transient ischemic attack (TIA), and cerebral infarction without residual deficits: Secondary | ICD-10-CM

## 2015-12-16 DIAGNOSIS — G9340 Encephalopathy, unspecified: Secondary | ICD-10-CM

## 2015-12-16 DIAGNOSIS — R0902 Hypoxemia: Secondary | ICD-10-CM

## 2015-12-16 DIAGNOSIS — G893 Neoplasm related pain (acute) (chronic): Secondary | ICD-10-CM

## 2015-12-16 DIAGNOSIS — Z789 Other specified health status: Secondary | ICD-10-CM

## 2015-12-16 DIAGNOSIS — M1990 Unspecified osteoarthritis, unspecified site: Secondary | ICD-10-CM

## 2015-12-16 DIAGNOSIS — E1165 Type 2 diabetes mellitus with hyperglycemia: Secondary | ICD-10-CM

## 2015-12-16 DIAGNOSIS — I509 Heart failure, unspecified: Secondary | ICD-10-CM

## 2015-12-16 DIAGNOSIS — J449 Chronic obstructive pulmonary disease, unspecified: Secondary | ICD-10-CM

## 2015-12-16 DIAGNOSIS — R531 Weakness: Secondary | ICD-10-CM

## 2015-12-16 DIAGNOSIS — R51 Headache: Secondary | ICD-10-CM

## 2015-12-16 DIAGNOSIS — C3411 Malignant neoplasm of upper lobe, right bronchus or lung: Secondary | ICD-10-CM

## 2015-12-16 DIAGNOSIS — Z7982 Long term (current) use of aspirin: Secondary | ICD-10-CM

## 2015-12-16 DIAGNOSIS — R634 Abnormal weight loss: Secondary | ICD-10-CM

## 2015-12-16 DIAGNOSIS — R4182 Altered mental status, unspecified: Secondary | ICD-10-CM

## 2015-12-16 DIAGNOSIS — R05 Cough: Secondary | ICD-10-CM

## 2015-12-16 DIAGNOSIS — Z7984 Long term (current) use of oral hypoglycemic drugs: Secondary | ICD-10-CM

## 2015-12-16 DIAGNOSIS — F17210 Nicotine dependence, cigarettes, uncomplicated: Secondary | ICD-10-CM

## 2015-12-16 DIAGNOSIS — R5381 Other malaise: Secondary | ICD-10-CM

## 2015-12-16 DIAGNOSIS — R630 Anorexia: Secondary | ICD-10-CM

## 2015-12-16 DIAGNOSIS — Z794 Long term (current) use of insulin: Secondary | ICD-10-CM

## 2015-12-16 LAB — CBC
HCT: 35.5 % (ref 35.0–47.0)
HEMOGLOBIN: 11.7 g/dL — AB (ref 12.0–16.0)
MCH: 28.7 pg (ref 26.0–34.0)
MCHC: 33 g/dL (ref 32.0–36.0)
MCV: 87.1 fL (ref 80.0–100.0)
Platelets: 202 10*3/uL (ref 150–440)
RBC: 4.08 MIL/uL (ref 3.80–5.20)
RDW: 15.4 % — AB (ref 11.5–14.5)
WBC: 22.2 10*3/uL — ABNORMAL HIGH (ref 3.6–11.0)

## 2015-12-16 LAB — COMPREHENSIVE METABOLIC PANEL
ALBUMIN: 2 g/dL — AB (ref 3.5–5.0)
ALK PHOS: 360 U/L — AB (ref 38–126)
ALT: 56 U/L — ABNORMAL HIGH (ref 14–54)
ANION GAP: 13 (ref 5–15)
AST: 49 U/L — ABNORMAL HIGH (ref 15–41)
BILIRUBIN TOTAL: 0.2 mg/dL — AB (ref 0.3–1.2)
BUN: 52 mg/dL — AB (ref 6–20)
CALCIUM: 7.9 mg/dL — AB (ref 8.9–10.3)
CO2: 23 mmol/L (ref 22–32)
Chloride: 98 mmol/L — ABNORMAL LOW (ref 101–111)
Creatinine, Ser: 1.22 mg/dL — ABNORMAL HIGH (ref 0.44–1.00)
GFR calc Af Amer: 55 mL/min — ABNORMAL LOW (ref >60)
GFR calc non Af Amer: 48 mL/min — ABNORMAL LOW (ref >60)
GLUCOSE: 164 mg/dL — AB (ref 65–99)
Potassium: 3.7 mmol/L (ref 3.5–5.1)
Sodium: 134 mmol/L — ABNORMAL LOW (ref 135–145)
TOTAL PROTEIN: 6.6 g/dL (ref 6.5–8.1)

## 2015-12-16 LAB — GLUCOSE, CAPILLARY
GLUCOSE-CAPILLARY: 148 mg/dL — AB (ref 65–99)
Glucose-Capillary: 201 mg/dL — ABNORMAL HIGH (ref 65–99)

## 2015-12-16 MED ORDER — MORPHINE SULFATE (PF) 2 MG/ML IV SOLN
2.0000 mg | INTRAVENOUS | Status: DC | PRN
  Administered 2015-12-16: 15:00:00 2 mg via INTRAVENOUS
  Filled 2015-12-16: qty 1

## 2015-12-16 MED ORDER — ACETAMINOPHEN 325 MG PO TABS: 650.0000 mg | ORAL_TABLET | Freq: Four times a day (QID) | ORAL | 0 refills | Status: AC | PRN

## 2015-12-16 MED ORDER — CHLORHEXIDINE GLUCONATE 0.12 % MT SOLN
15.0000 mL | Freq: Two times a day (BID) | OROMUCOSAL | Status: DC
  Administered 2015-12-16: 15 mL via OROMUCOSAL
  Filled 2015-12-16: qty 15

## 2015-12-16 MED ORDER — MORPHINE SULFATE (PF) 2 MG/ML IV SOLN
2.0000 mg | INTRAVENOUS | Status: DC | PRN
  Administered 2015-12-16: 2 mg via INTRAVENOUS
  Filled 2015-12-16: qty 1

## 2015-12-16 MED ORDER — MORPHINE SULFATE (CONCENTRATE) 10 MG/0.5ML PO SOLN: 10.0000 mg | ORAL | Status: DC | PRN

## 2015-12-16 MED ORDER — MORPHINE SULFATE (CONCENTRATE) 10 MG/0.5ML PO SOLN: 5.0000 mg | ORAL | 0 refills | Status: AC | PRN

## 2015-12-16 MED ORDER — ALPRAZOLAM 0.5 MG PO TABS
0.5000 mg | ORAL_TABLET | Freq: Three times a day (TID) | ORAL | Status: DC
Start: 2015-12-16 — End: 2015-12-16
  Administered 2015-12-16 (×2): 0.5 mg via ORAL
  Filled 2015-12-16 (×2): qty 1

## 2015-12-16 NOTE — Care Management Important Message (Signed)
Important Message  Patient Details  Name: Marie [name] MRN: [id number] Date of Birth: 04/19/1955   Medicare Important Message Given:  Yes    Shelbie Ammons, RN 12/16/2015, 8:32 AM

## 2015-12-16 NOTE — Progress Notes (Signed)
[facility] [place] NAME: Marie [name]    MR#:  [id number]  DATE OF BIRTH:  06-Apr-1956  SUBJECTIVE: Admitted for abdominal pain, progressive weakness. 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 I discussed with patient, patient's oncologist. Patient is declining functionality, she is not appropriate for hospice home placement. And she is agreeable for this. And Dr. Rogue Bussing spoke to patient's significant other, he is also agreeable for this.   CHIEF COMPLAINT:   Chief Complaint  Patient presents with  . Abdominal Pain    REVIEW OF SYSTEMS:    Review of Systems  Constitutional: Positive for malaise/fatigue and weight loss. Negative for chills and fever.  HENT: Negative for hearing loss.   Eyes: Negative for blurred vision, double vision and photophobia.  Respiratory: Negative for cough, hemoptysis and shortness of breath.   Cardiovascular: Negative for palpitations, orthopnea and leg swelling.  Gastrointestinal: Positive for abdominal pain and nausea. Negative for diarrhea and vomiting.  Genitourinary: Negative for dysuria and urgency.  Musculoskeletal: Negative for myalgias and neck pain.  Skin: Negative for rash.  Neurological: Positive for weakness. Negative for dizziness, focal weakness, seizures and headaches.  Psychiatric/Behavioral: Negative for memory loss. The patient does not have insomnia.     Nutrition:  Tolerating Diet: Tolerating PT:      DRUG ALLERGIES:   Allergies  Allergen Reactions  . Ampicillin Anaphylaxis  . Penicillins Anaphylaxis and Shortness Of Breath    Has patient had a PCN reaction causing immediate rash, facial/tongue/throat swelling, SOB or lightheadedness with hypotension: yes Has patient had a PCN reaction causing severe rash involving mucus membranes or skin necrosis: no Has patient had a PCN reaction that required hospitalization no Has patient had  a PCN reaction occurring within the last 10 years: no If all of the above answers are "NO", then may proceed with Cephalosporin use.   . Avelox [Moxifloxacin Hcl In Nacl] Other (See Comments)    Reaction: MUSCLE CRAMPS  . Etodolac Rash  . Neurontin [Gabapentin] Anxiety    VITALS:  Blood pressure 101/60, pulse 73, temperature 98.5 F (36.9 C), temperature source Oral, resp. rate 18, height '5\' 2"'$  (1.575 m), weight 85.3 kg (188 lb), SpO2 90 %.  PHYSICAL EXAMINATION:   Physical Exam  GENERAL:  60 y.o.-year-old patient lying in the bed with no acute distress.  EYES: Pupils equal, round, reactive to light and accommodation. No scleral icterus. Extraocular muscles intact.  HEENT: Head atraumatic, normocephalic. Oropharynx and nasopharynx clear.  NECK:  Supple, no jugular venous distention. No thyroid enlargement, no tenderness.  LUNGS: Normal breath sounds bilaterally, no wheezing, rales,rhonchi or crepitation. No use of accessory muscles of respiration.  CARDIOVASCULAR: S1, S2 normal. No murmurs, rubs, or gallops.  ABDOMEN: Soft, nontender, nondistended. Bowel sounds present. No organomegaly or mass.  EXTREMITIES: No pedal edema, cyanosis, or clubbing.  NEUROLOGIC: Cranial nerves II through XII are intact. Muscle strength 5/5 in all extremities. Sensation intact. Gait not checked.  PSYCHIATRIC: The patient is alert and oriented x 3.  SKIN: No obvious rash, lesion, or ulcer.    LABORATORY PANEL:   CBC  Recent Labs Lab 12/16/15 0403  WBC 22.2*  HGB 11.7*  HCT 35.5  PLT 202   ------------------------------------------------------------------------------------------------------------------  Chemistries   Recent Labs Lab 12/16/15 0403  NA 134*  K 3.7  CL 98*  CO2 23  GLUCOSE 164*  BUN 52*  CREATININE 1.22*  CALCIUM 7.9*  AST  49*  ALT 56*  ALKPHOS 360*  BILITOT 0.2*    ------------------------------------------------------------------------------------------------------------------  Cardiac Enzymes No results for input(s): TROPONINI in the last 168 hours. ------------------------------------------------------------------------------------------------------------------  RADIOLOGY:  Dg Chest 2 View  Result Date: 12/15/2015 CLINICAL DATA:  60 year old female presenting with hypoxia. History of brain cancer and lung cancer. EXAM: CHEST  2 VIEW COMPARISON:  Chest radiograph dated 12/02/2015 and chest CT dated 09/25/2014 cyst in FINDINGS: There has been interval removal of the previously seen right IJ central line. Interval resolution of the previously seen pulmonary edema. Bibasilar hazy densities, likely atelectatic changes. Right infrahilar opacity similar to prior study and likely corresponding to the postsurgical scarring. There is no pleural effusion or pneumothorax. The cardiac silhouette is within normal limits. No acute osseous pathology. IMPRESSION: Interval removal of the right IJ central line and resolution of the previously seen pulmonary edema. Bibasilar subsegmental atelectatic changes. Stable right infrahilar opacity corresponds to the right lower lobe postsurgical changes. Electronically Signed   By: Anner Crete M.D.   On: 12/15/2015 18:37   Ct Head Wo Contrast  Result Date: 12/15/2015 CLINICAL DATA:  60 year old female with known history of lung cancer with brain metastasis. Patient presenting with altered mental status. EXAM: CT HEAD WITHOUT CONTRAST TECHNIQUE: Contiguous axial images were obtained from the base of the skull through the vertex without intravenous contrast. COMPARISON:  Head CT dated 12/08/2015 an MRI dated 12/10/2015. FINDINGS: The ventricles and sulci are appropriate in size for patient's age. Mild periventricular and deep white matter chronic microvascular ischemic changes noted. There is a stable area of vasogenic edema in the  right parietal convexity white matter suspicious for underlying metastatic disease or recurrence. A wedge-shaped area of hypodensity in the right cerebellar hemisphere appear similar to the CT dated 12/08/2015 and corresponds to the infarcts seen on the MRI dated 12/10/2015. There is a 1.4 x 1.4 cm hypo attenuating lesion in the superior aspect of the left cerebellar hemisphere which appears new from prior CT and MRI. Although this may represent vasogenic edema related to underlying metastatic disease, an acute ischemia is more favored given sudden interval development since the most recent study of 12/10/2015. There is a small stable lacunar infarct in the left centrum semiovale. There is no acute intracranial hemorrhage. No midline shift identified. The visualized paranasal sinuses and mastoid air cells are clear. Right parietal craniotomy again noted. IMPRESSION: No acute intracranial hemorrhage. Stable appearing right parietal convexity white matter vasogenic edema as seen on the prior studies concerning for underlying metastatic disease or recurrence. Interval development of the focal hypodensity in the superior aspect of the left cerebellar hemisphere along the tentorium which although may be related to edema and metastatic disease, is favored to represent a focal area of acute infarct given sudden interval development since the most recent study. Clinical correlation is recommended. MRI without and with contrast may provide better evaluation. Right cerebellar hemisphere wedge-shaped hypodensity similar to prior study, likely an infarct. These results were called by telephone at the time of interpretation on 12/15/2015 at 9:51 pm to Dr. Gladstone Lighter , who verbally acknowledged these results. Electronically Signed   By: Anner Crete M.D.   On: 12/15/2015 21:51     ASSESSMENT AND PLAN:   Active Problems:   Encephalopathy  Metabolic encephalopathy secondary to dehydration improved. Patient alert  awake oriented. #2 generalized weakness, failure to thrive, patient has advanced lung cancer with metastasis to bone, brain. Request palliative care consult, possible hospice home placement because of advanced  malignancy. 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 Prognosis is poor, high risk for cardiac arrest. All the records are reviewed and case discussed with Care Management/Social Workerr. Management plans discussed with the patient, family and they are in agreement.  CODE STATUS: DO NOT RESUSCITATE  TOTAL TIME TAKING CARE OF THIS PATIENT: 35 minutes.   POSSIBLE D/C IN 1-2DAYS, DEPENDING ON CLINICAL CONDITION.   Epifanio Lesches M.D on 12/16/2015 at 10:00 AM  Between 7am to 6pm - Pager - [phone]  After 6pm go to [email or web address] - password [email or web address]  Office  [phone]  CC: Primary care physician; Cletis Athens, MD

## 2015-12-16 NOTE — Progress Notes (Signed)
Spoke with Marliss Czar, [employer] rep at [phone], to notify of non-emergent EMS transport.  Auth notification reference given as [id number].   Service date range good from 12/16/15 - 03/15/16.   Geographical gap exception requested to determine if services can be considered at an in-network level.

## 2015-12-16 NOTE — Progress Notes (Signed)
[facility] CONSULT NOTE  Patient Care Team: Cletis Athens, MD as PCP - General (Unknown Physician Specialty)  CHIEF COMPLAINTS/PURPOSE OF CONSULTATION:  Metastatic lung cancer  HISTORY OF PRESENTING ILLNESS:  Marie [name] 60 y.o.  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  As per the significant other- patient had been having significant mental status changes/generalized weakness- unable to take care of herself; an ambulance was called again. CT scan of the brain on contrast showed metastatic disease to the brain- and also new strokes. Oncology has been consult for further recommendations/ plan of care. Poor appetite. Weight loss.  Significant headache. Weakness in the left leg more than the right. Shortness of breath no hemoptysis. Chronic cough. Overall feeling very poorly.  ROS: A complete 10 point review of system is done which is negative except mentioned above in history of present illness  MEDICAL HISTORY:  Past Medical History:  Diagnosis Date  . Arthritis   . Cancer ([place])    Lung CA right wedge removed  . CHF (congestive heart failure) ([place])   . COPD (chronic obstructive pulmonary disease) ([facility])    has oxygen but doesn't use  . Diabetes mellitus type 2 with neurological manifestations ([place])    Peripheral neuropathy and foot ulcers  . Hypertension   . PAD (peripheral artery disease) ([place])   . Peripheral neuropathy ([place])   . Shortness of breath dyspnea     SURGICAL HISTORY: Past Surgical History:  Procedure Laterality Date  . ABDOMINAL HYSTERECTOMY    . BACK SURGERY  x 7  . CARPAL TUNNEL RELEASE Bilateral   . CESAREAN SECTION     x3  . CORONARY ANGIOPLASTY     no stents  . FOOT SURGERY Left    diabetic ulcer  . KNEE ARTHROSCOPY Bilateral   . TONSILLECTOMY    . VIDEO  ASSISTED THORACOSCOPY (VATS)/THOROCOTOMY Right 08/26/2014   Procedure: VIDEO ASSISTED THORACOSCOPY (VATS)/THOROCOTOMY;  Surgeon: Nestor Lewandowsky, MD;  Location: [facility] ORS;  Service: General;  Laterality: Right;    SOCIAL HISTORY: Social History   Social History  . Marital status: Widowed    Spouse name: N/A  . Number of children: N/A  . Years of education: N/A   Occupational History  . Not on file.   Social History Main Topics  . Smoking status: Current Every Day Smoker    Packs/day: 0.50    Years: 46.00    Types: Cigarettes  . Smokeless tobacco: Never Used  . Alcohol use No  . Drug use: No  . Sexual activity: Not on file   Other Topics Concern  . Not on file   Social History Narrative   Lives at home with boyfriend.    FAMILY HISTORY: Family History  Problem Relation Age of Onset  . Hypertension Brother   . Coronary artery disease Mother   . Diabetes Mellitus II Mother   . Coronary artery disease Father   . Diabetes Mellitus II Father   . Diabetes Mellitus II Brother     ALLERGIES:  is allergic to ampicillin; penicillins; avelox [moxifloxacin hcl in nacl]; etodolac; and neurontin [gabapentin].  MEDICATIONS:  Current Facility-Administered Medications  Medication Dose Route Frequency Provider Last Rate Last Dose  . 0.9 %  sodium chloride infusion   Intravenous Continuous Gladstone Lighter, MD 75 mL/hr at 12/15/15 2148    . acetaminophen (TYLENOL)  tablet 650 mg  650 mg Oral Q6H PRN Gladstone Lighter, MD       Or  . acetaminophen (TYLENOL) suppository 650 mg  650 mg Rectal Q6H PRN Gladstone Lighter, MD      . ALPRAZolam Duanne Moron) tablet 0.25 mg  0.25 mg Oral BID Gladstone Lighter, MD   0.25 mg at 12/15/15 2246  . aspirin EC tablet 81 mg  81 mg Oral Daily Gladstone Lighter, MD      . chlorhexidine (PERIDEX) 0.12 % solution 15 mL  15 mL Mouth/Throat BID Gladstone Lighter, MD      . cyclobenzaprine (FLEXERIL) tablet 10 mg  10 mg Oral TID PRN Gladstone Lighter, MD      .  dexamethasone (DECADRON) tablet 4 mg  4 mg Oral Daily Gladstone Lighter, MD      . diphenhydrAMINE (BENADRYL) capsule 25 mg  25 mg Oral QHS Gladstone Lighter, MD   25 mg at 12/15/15 2245  . DULoxetine (CYMBALTA) DR capsule 60 mg  60 mg Oral Daily Gladstone Lighter, MD      . enoxaparin (LOVENOX) injection 40 mg  40 mg Subcutaneous Q24H Gladstone Lighter, MD   40 mg at 12/15/15 2247  . gemfibrozil (LOPID) tablet 600 mg  600 mg Oral BID AC Gladstone Lighter, MD      . insulin aspart (novoLOG) injection 0-5 Units  0-5 Units Subcutaneous QHS Gladstone Lighter, MD   3 Units at 12/15/15 2243  . insulin aspart (novoLOG) injection 0-9 Units  0-9 Units Subcutaneous TID WC Gladstone Lighter, MD      . insulin glargine (LANTUS) injection 20 Units  20 Units Subcutaneous QHS Gladstone Lighter, MD   20 Units at 12/15/15 2256  . levETIRAcetam (KEPPRA) tablet 500 mg  500 mg Oral BID Gladstone Lighter, MD   500 mg at 12/15/15 2245  . metFORMIN (GLUCOPHAGE) tablet 500 mg  500 mg Oral BID WC Gladstone Lighter, MD      . mometasone-formoterol [facility]) 200-5 MCG/ACT inhaler 2 puff  2 puff Inhalation BID Gladstone Lighter, MD   2 puff at 12/15/15 2251  . ondansetron (ZOFRAN) tablet 4 mg  4 mg Oral Q6H PRN Gladstone Lighter, MD   4 mg at 12/15/15 2246   Or  . ondansetron (ZOFRAN) injection 4 mg  4 mg Intravenous Q6H PRN Gladstone Lighter, MD      . pantoprazole (PROTONIX) EC tablet 40 mg  40 mg Oral Daily Gladstone Lighter, MD      . pregabalin (LYRICA) capsule 150 mg  150 mg Oral BID Gladstone Lighter, MD   150 mg at 12/15/15 2246  . rosuvastatin (CRESTOR) tablet 10 mg  10 mg Oral Daily Gladstone Lighter, MD   10 mg at 12/15/15 2244  . senna-docusate (Senokot-S) tablet 1 tablet  1 tablet Oral Daily PRN Gladstone Lighter, MD          .  PHYSICAL EXAMINATION:  Vitals:   12/15/15 2141 12/16/15 0426  BP: 93/62 105/61  Pulse: 63 65  Resp: 16 16  Temp: 97.5 F (36.4 C) 97.9 F (36.6 C)   Filed Weights    12/15/15 1414  Weight: 188 lb (85.3 kg)    GENERAL: Well-nourished well-developed; Alert, no distress and comfortable.   Alone. Positive for temporal wasting. EYES: no pallor or icterus OROPHARYNX: no thrush or ulceration. NECK: supple, no masses felt LYMPH:  no palpable lymphadenopathy in the cervical, axillary or inguinal regions LUNGS: decreased breath sounds to auscultation at bases and  No wheeze or  crackles HEART/CVS: regular rate & rhythm and no murmurs; No lower extremity edema ABDOMEN: abdomen soft, non-tender and normal bowel sounds Musculoskeletal:no cyanosis of digits and no clubbing  PSYCH: alert & oriented x 3 with fluent speech NEURO: Weakness left leg more than the right. SKIN:  no rashes or significant lesions  LABORATORY DATA:  I have reviewed the data as listed Lab Results  Component Value Date   WBC 22.2 (H) 12/16/2015   HGB 11.7 (L) 12/16/2015   HCT 35.5 12/16/2015   MCV 87.1 12/16/2015   PLT 202 12/16/2015    Recent Labs  12/08/15 1940 12/09/15 0500 12/10/15 2110 12/15/15 1509 12/16/15 0403  NA 139 139  --  133* 134*  K 3.6 4.0  --  4.8 3.7  CL 105 105  --  96* 98*  CO2 23 24  --  23 23  GLUCOSE 199* 258* 448* 299* 164*  BUN 10 13  --  55* 52*  CREATININE 0.63 0.69  --  1.44* 1.22*  CALCIUM 9.1 8.5*  --  8.2* 7.9*  GFRNONAA >60 >60  --  39* 48*  GFRAA >60 >60  --  45* 55*  PROT 6.9  --   --  7.1 6.6  ALBUMIN 2.4*  --   --  2.2* 2.0*  AST 19  --   --  54* 49*  ALT 23  --   --  55* 56*  ALKPHOS 201*  --   --  386* 360*  BILITOT 0.8  --   --  0.5 0.2*    RADIOGRAPHIC STUDIES: I have personally reviewed the radiological images as listed and agreed with the findings in the report. Dg Chest 1 View  Result Date: 12/02/2015 CLINICAL DATA:  Code sepsis.  Lung cancer and altered mental status EXAM: CHEST 1 VIEW COMPARISON:  06/05/2015 FINDINGS: Chronic hazy appearance at the right base where there are lung sutures. Scar in this region on 2016 chest  CT. Mild increase in left basilar markings which was also seen previously. No cardiomegaly for technique. Negative aortic and hilar contours. IMPRESSION: Stable compared to prior, including postoperative changes in the right lower lobe. No convincing pneumonia. Electronically Signed   By: Monte Fantasia M.D.   On: 12/02/2015 21:49   Dg Chest 2 View  Result Date: 12/15/2015 CLINICAL DATA:  60 year old female presenting with hypoxia. History of brain cancer and lung cancer. EXAM: CHEST  2 VIEW COMPARISON:  Chest radiograph dated 12/02/2015 and chest CT dated 09/25/2014 cyst in FINDINGS: There has been interval removal of the previously seen right IJ central line. Interval resolution of the previously seen pulmonary edema. Bibasilar hazy densities, likely atelectatic changes. Right infrahilar opacity similar to prior study and likely corresponding to the postsurgical scarring. There is no pleural effusion or pneumothorax. The cardiac silhouette is within normal limits. No acute osseous pathology. IMPRESSION: Interval removal of the right IJ central line and resolution of the previously seen pulmonary edema. Bibasilar subsegmental atelectatic changes. Stable right infrahilar opacity corresponds to the right lower lobe postsurgical changes. Electronically Signed   By: Anner Crete M.D.   On: 12/15/2015 18:37   Ct Head Wo Contrast  Result Date: 12/15/2015 CLINICAL DATA:  60 year old female with known history of lung cancer with brain metastasis. Patient presenting with altered mental status. EXAM: CT HEAD WITHOUT CONTRAST TECHNIQUE: Contiguous axial images were obtained from the base of the skull through the vertex without intravenous contrast. COMPARISON:  Head CT dated 12/08/2015 an MRI dated  12/10/2015. FINDINGS: The ventricles and sulci are appropriate in size for patient's age. Mild periventricular and deep white matter chronic microvascular ischemic changes noted. There is a stable area of vasogenic edema  in the right parietal convexity white matter suspicious for underlying metastatic disease or recurrence. A wedge-shaped area of hypodensity in the right cerebellar hemisphere appear similar to the CT dated 12/08/2015 and corresponds to the infarcts seen on the MRI dated 12/10/2015. There is a 1.4 x 1.4 cm hypo attenuating lesion in the superior aspect of the left cerebellar hemisphere which appears new from prior CT and MRI. Although this may represent vasogenic edema related to underlying metastatic disease, an acute ischemia is more favored given sudden interval development since the most recent study of 12/10/2015. There is a small stable lacunar infarct in the left centrum semiovale. There is no acute intracranial hemorrhage. No midline shift identified. The visualized paranasal sinuses and mastoid air cells are clear. Right parietal craniotomy again noted. IMPRESSION: No acute intracranial hemorrhage. Stable appearing right parietal convexity white matter vasogenic edema as seen on the prior studies concerning for underlying metastatic disease or recurrence. Interval development of the focal hypodensity in the superior aspect of the left cerebellar hemisphere along the tentorium which although may be related to edema and metastatic disease, is favored to represent a focal area of acute infarct given sudden interval development since the most recent study. Clinical correlation is recommended. MRI without and with contrast may provide better evaluation. Right cerebellar hemisphere wedge-shaped hypodensity similar to prior study, likely an infarct. These results were called by telephone at the time of interpretation on 12/15/2015 at 9:51 pm to Dr. Gladstone Lighter , who verbally acknowledged these results. Electronically Signed   By: Anner Crete M.D.   On: 12/15/2015 21:51   Ct Head Wo Contrast  Result Date: 12/08/2015 CLINICAL DATA:  "Not feeling well" in a patient with Brain cancer. Records in epic care  everywhere document a previous CT scan performed at [place] on 10/26/2015 for history of adenocarcinoma and enhancing dural-based lesion in the right parietal lobe. EXAM: CT HEAD WITHOUT CONTRAST TECHNIQUE: Contiguous axial images were obtained from the base of the skull through the vertex without intravenous contrast. COMPARISON:  None. FINDINGS: Brain: High right parietal vasogenic edema is noted with a probable peripheral 10 mm hyper attenuating lesion (image 26 series 2). Overlying craniotomy defect is evident. 8 mm hypo attenuating lesion is identified in the right thalamus, not described previously. Wedge-shaped area of hypoattenuation identified in the inferior right cerebellar hemisphere. On imaging today, this has an appearnace of encephalomalacia related infarct, but could certainly represent vasogenic edema from mass lesion. This finding was not described up on the CT report from [place] about 1 month ago, suggesting that it is a new interval finding. No evidence for midline shift. No hydrocephalus. No CT findings of acute hemorrhage. Vascular: Atherosclerotic calcification is visualized in the carotid arteries. No dense MCA sign. Major dural sinuses are unremarkable. Skull: High right parietal craniotomy defect noted. Sinuses/Orbits: The visualized paranasal sinuses and mastoid air cells are clear. Visualized portions of the globes and intraorbital fat are unremarkable. Other: N/A IMPRESSION: 1. Chronic right parietal vasogenic edema with probable 10 mm hyper attenuating lesion towards the vertex. Overlying craniotomy defect suggest previous surgery in this region and vasogenic edema was described in this area on previous outside CT report from [place] dated 10/26/2015. 2. 8 mm hypo attenuating lesion right down  was not described previously. Age indeterminate lacunar infarction could have this appearance. MRI without and with contrast may  prove helpful to further evaluate. 3. Peripheral wedge-shaped area of apparent encephalomalacia in the inferior right cerebellar hemisphere from so not described previously. This could represent an area of post ischemic encephalomalacia but given the history of metastatic disease to the brain, vasogenic edema from metastatic deposit cannot be entirely excluded. This could also be better assessed by MRI. 4. No hydrocephalus or midline shift. No evidence for acute hemorrhage. Electronically Signed   By: Misty Stanley M.D.   On: 12/08/2015 20:07   Mr Jeri Cos VZ Contrast  Result Date: 12/10/2015 CLINICAL DATA:  60 year old female with intractable nausea vomiting. Lung cancer with metastatic disease. Previous vertex craniotomy with underlying vasogenic edema on noncontrast head CT 2 days ago. Initial encounter. EXAM: MRI HEAD WITHOUT AND WITH CONTRAST TECHNIQUE: Multiplanar, multiecho pulse sequences of the brain and surrounding structures were obtained without and with intravenous contrast. CONTRAST:  66m MULTIHANCE GADOBENATE DIMEGLUMINE 529 MG/ML IV SOLN COMPARISON:  Head CT without contrast 12/08/2015. Records in epic care everywhere document [place] CT [place] [place] for history of adenocarcinoma and enhancing dural-based lesion in the right parietal lobe. FINDINGS: Sequelae of right vertex craniotomy. Widespread pachymeningeal thickening and enhancement over the superior brain (series 11, image 16). Resection cavity at the superior right parietal lobe with overlying slightly more thick and irregular dural or meningeal enhancement (series 12, image 12 and series 11, image 23. Subjacent T2 and FLAIR hyperintensity in the right parietal and posterior right frontal lobes with mild gyral expansion and regional mass effect (series 6, image 20). Superimposed small 5 mm focus of white matter restricted diffusion a mix the area of vasogenic edema as seen on series 100  and series 101, image 41. Furthermore, there is confluent 3 cm area of trace diffusion abnormality in the inferior right cerebellum. Isointense to slightly restricted on ADC. Associated T2 and FLAIR hyperintensity without hemorrhage or mass effect. There is T2 and FLAIR hyperintensity in the medial right thalamus with increased trace diffusion signal but mild if any diffusion restriction. There are small foci ranging from punctate to 5 mm in the left occipital and parietal lobes with possible restricted diffusion. Mild if any associated T2 and FLAIR signal changes in those areas. No acute intracranial hemorrhage identified. No enhancement at any of these areas. Major intracranial vascular flow voids are preserved. Confluent T2 hyperintensity in the pons and scattered small areas of nonspecific white matter T2 and FLAIR hyperintensity in addition to those described above are noted. No chronic cerebral blood products except at the right parietal resection cavity. No ventriculomegaly. Patent basilar cisterns. Negative pituitary, cervicomedullary junction and visualized cervical spine. Visualized bone marrow signal is within normal limits. Small volume postoperative extra-axial collection overlying the resection cavity. Visible internal auditory structures appear normal. Trace right mastoid effusion. Paranasal sinuses are clear. No acute orbit or scalp soft tissue findings. IMPRESSION: 1. Early subacute appearing - mostly lacunar type - infarcts in the right PICA territory, scattered both PCA territories, and also in the right MCA/PCA territory (in the area of vasogenic edema described in #2). No associated hemorrhage or infarct related mass effect. 2. Right parietal craniotomy and resection cavity. Mildly irregular dural/meningeal thickening at the resection site with regional vasogenic edema and mild mass effect. This appearance is suspicious for metastatic disease recurrence, and comparison with previous brain MRIs  is recommended. 3. No  other metastatic disease to the brain suspected; widespread smooth dural thickening over both hemispheres is favored to be postoperative in nature. Electronically Signed   By: Genevie Ann M.D.   On: 12/10/2015 10:46   Ct Abdomen Pelvis W Contrast  Result Date: 12/03/2015 CLINICAL DATA:  Severe sepsis. Right lower quadrant abdominal pain. Lung cancer. EXAM: CT ABDOMEN AND PELVIS WITH CONTRAST TECHNIQUE: Multidetector CT imaging of the abdomen and pelvis was performed using the standard protocol following bolus administration of intravenous contrast. CONTRAST:  164m ISOVUE-300 IOPAMIDOL (ISOVUE-300) INJECTION 61% COMPARISON:  Head CT 07/09/2014.  Chest CT 09/25/2014 FINDINGS: Lower chest and abdominal wall: Partly seen ill-defined airspace opacity in the right middle lobe. There are new pulmonary nodules in the bibasilar lungs, largest in the right posterior costophrenic sulcus measuring 12 mm. Per report in care everywhere 10/23/2015 these are new/progressed. Septal thickening consistent with edema. Hepatobiliary: Numerous low-density liver masses that are new compared to chest CT comparison. These measure up to 3 cm in size in multiple segments. Although low-density they are likely solid. Multi focal abscess considered unlikely.Cholecystectomy. Normal common bile duct diameter. Pancreas: Unremarkable. Spleen: Unremarkable. Adrenals/Urinary Tract: Negative adrenals. Patchy heterogeneous hypo enhancement in the kidneys having ill-defined masslike appearance. Pyelonephritis is considered given the clinical circumstances, but metastases are favored. Unremarkable bladder. Stomach/Bowel:  No obstruction. No appendicitis. Reproductive:Hysterectomy. Possible right oophorectomy. Negative left ovary. Vascular/Lymphatic: No acute vascular abnormality. Mild enlargement of lymph nodes in the deep liver drainage. Other: No ascites or pneumoperitoneum. Musculoskeletal: No acute or aggressive finding. L3-4  and L4-5 discectomy with solid bony fusion. IMPRESSION: 1. Partly seen right middle lobe pneumonia. Nodules in the bilateral lungs are primarily concerning for metastatic foci, but hematogenous infection is not excluded as these have rapidly developed compared to 10/23/2015 chest CT report. 2. Pulmonary edema. 3. Abnormal bilateral renal perfusion which could be pyelonephritis or metastases. Correlate with urinalysis and follow-up. 4. Numerous hepatic metastases. Electronically Signed   By: JMonte FantasiaM.D.   On: 12/03/2015 00:23   UKoreaCarotid Bilateral  Result Date: 12/10/2015 CLINICAL DATA:  CVA. EXAM: BILATERAL CAROTID DUPLEX ULTRASOUND TECHNIQUE: GPearline Cablesscale imaging, color Doppler and duplex ultrasound were performed of bilateral carotid and vertebral arteries in the neck. COMPARISON:  MRI 12/10/2015 . FINDINGS: Criteria: Quantification of carotid stenosis is based on velocity parameters that correlate the residual internal carotid diameter with NASCET-based stenosis levels, using the diameter of the distal internal carotid lumen as the denominator for stenosis measurement. The following velocity measurements were obtained: RIGHT ICA:  106/30 sec cm/sec CCA:  1893/81a cm/sec SYSTOLIC ICA/CCA RATIO:  00.17DIASTOLIC ICA/CCA RATIO:  15.10ECA:  147 cm/sec LEFT ICA:  74/21 cm/sec CCA:  1258/52cm/sec SYSTOLIC ICA/CCA RATIO:  0.7 DIASTOLIC ICA/CCA RATIO:  1.1 ECA:  147 cm/sec RIGHT CAROTID ARTERY: Mild right carotid bifurcation atherosclerotic vascular disease. No flow limiting stenosis. RIGHT VERTEBRAL ARTERY:  Patent with antegrade flow. LEFT CAROTID ARTERY: Mild left carotid bifurcation atherosclerotic vascular disease. No flow limiting stenosis. LEFT VERTEBRAL ARTERY:  Patent with antegrade flow. IMPRESSION: 1. 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 No flow limiting stenosis. Degree of stenosis less than 50% bilaterally. 2. Vertebral arteries are patent with antegrade flow.  Electronically Signed   By: TMarcello Moores Register   On: 12/10/2015 15:28   Dg Chest Portable 1 View  Result Date: 12/02/2015 CLINICAL DATA:  Central line placement EXAM: PORTABLE CHEST 1 VIEW COMPARISON:  Earlier today FINDINGS: New right IJ central line with tip at  the SVC level. No pneumothorax or new mediastinal widening. New bilateral interstitial prominence suggesting edema. Normal heart size and stable mediastinal contours. Postoperative changes in the right lower lobe are becoming obscured. IMPRESSION: 1. New central line without adverse finding. 2. New pulmonary edema. Electronically Signed   By: Monte Fantasia M.D.   On: 12/02/2015 23:37   Dg Foot Complete Left  Result Date: 11/24/2015 CLINICAL DATA:  Open wound on left foot, initial encounter EXAM: LEFT FOOT - COMPLETE 3+ VIEW COMPARISON:  10/31/2014 FINDINGS: Changes in the left second toe which have progressed somewhat in the interval from the prior exam particularly in the proximal phalanx. Soft tissue wound is again noted. No frank bony destruction to suggest osteomyelitis is seen. Clinical correlation is recommended. IMPRESSION: Some mild progressive loss at the base of the second proximal phalanx. Associated soft tissue wound is noted although no definitive bony destruction is seen. Electronically Signed   By: Inez Catalina M.D.   On: 11/24/2015 16:20   Dg Hip Unilat With Pelvis 2-3 Views Left  Result Date: 12/10/2015 CLINICAL DATA:  60 year old female with acute onset bilateral hip pain with no known injury. Initial encounter. Prior right hip arthroplasty. EXAM: DG HIP (WITH OR WITHOUT PELVIS) 2-3V LEFT COMPARISON:  Right hip series from today reported separately. CT Abdomen and Pelvis 12/02/2015. FINDINGS: Pelvis appears intact. Previous lumbar spine surgery. Right hip arthroplasty changes. Moderate left hip joint space loss with subchondral sclerosis and osteophytosis of the acetabulum. Proximal left femur intact. No acute osseous  abnormality identified. IMPRESSION: 1. Moderate left hip osteoarthritis. No acute osseous abnormality identified. 2. Right hip series today reported separately. Electronically Signed   By: Genevie Ann M.D.   On: 12/10/2015 10:55   Dg Hip Unilat With Pelvis 2-3 Views Right  Result Date: 12/10/2015 CLINICAL DATA:  60 year old female with acute onset bilateral hip pain with no known injury. Initial encounter. Prior right hip arthroplasty. EXAM: DG HIP (WITH OR WITHOUT PELVIS) 2-3V RIGHT COMPARISON:  CT Abdomen and Pelvis 12/02/2015. Right hip series 01/24/12. FINDINGS: Lower lumbar spine postoperative changes also noted. Right hip arthroplasty hardware appears stable and intact. Pelvis intact. SI joints within normal limits. Moderate left hip joint space loss with acetabular subchondral sclerosis and osteophytosis. No acute osseous abnormality identified. Negative visible bowel gas pattern. IMPRESSION: 1. No acute osseous abnormality identified. About the right hip per pelvis. 2. Right hip arthroplasty hardware appears intact and normally aligned. 3. Left hip reported separately. Electronically Signed   By: Genevie Ann M.D.   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  # Mental status changes multifactorial- metabolic encephalopathy/ new onset of stroke noted on the CT scan. Also contributing likely progressive malignancy in the brain.  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 Significant decline in performance status ECOG performance status 4. Currently on radiation to the brain.  # Long discussion the patient regarding not recommending any further therapy given the significant decline in the performance status of the last few weeks. Hospice is recommended. Patient agreed.  #  Discussed the above plan with Dr.Konidena; and also palliative  care team Wadie Lessen.   Thank you Dr. Vianne Bulls for allowing me to participate in the care of your pleasant patient. Please do not hesitate to contact me with questions or concerns in the interim.  Cammie Sickle, MD 12/16/2015 7:37 AM

## 2015-12-16 NOTE — Clinical Social Work Note (Signed)
Clinical Social Work Assessment  Patient Details  Name: Marie [name] MRN: [id number] Date of Birth: 10-Nov-1955  Date of referral:  12/16/15               Reason for consult:  Discharge Planning                Permission sought to share information with:  Family Supports Permission granted to share information::     Name::        Agency::     Relationship::   Marie [name]- Marie [name])  Contact Information:     Housing/Transportation Living arrangements for the past 2 months:  Single Family Home Source of Information:  Other (Comment Required) Marie [name]) Patient Interpreter Needed:  None Criminal Activity/Legal Involvement Pertinent to Current Situation/Hospitalization:  No - Comment as needed Significant Relationships:  Adult Children, [name] Lives with:  Adult Children, Other (Comment), Friends (Marie [name]) Do you feel safe going back to the place where you live?  No Need for family participation in patient care:  Yes (Comment) ([place])  Care giving concerns:  CSW received referral for residential hospice home placement.    Social Worker assessment / plan:  CSW attempted to complete assessment with patient but patient was receiving care from RN. [place] called patient's son. Phone number on chart is disconnected. CSW called patient's [name] Marie [name] listed on the chart. Per Marie [name] he lives with patient and her son. Stated he's patient's [name]. Reported that he recently spoke to Palliative Care NP and discussed Residential Hospice placement for patient. Stated that patient is in agreement. Palliative Care NP confirmed. CSW offered Residential Hospice choice. Per Marie [name] patient reported she wanted to go to [facility]. CSW made referral to [facility] Liaison. CSW will continue to follow and assist.   Employment status:  Retired Nurse, adult PT Recommendations:  Not assessed at this time Information / Referral to community  resources:   Cytogeneticist)  Patient/Family's Response to care:  Patient and her frined Marie [name] are in agreement for patient to discharge to a residential hospice facility per [place] NP. CSW confirmed it with patient's [name] Marie [name]. Preference [facility].   Patient/Family's Understanding of and Emotional Response to Diagnosis, Current Treatment, and Prognosis:  Patient and Marie [name] understand patient's diagnosis, current treatment and prognosis. Stated it aided them in choosing residential hospice placement. Thanked CSW for assistance.   Emotional Assessment Appearance:  Appears stated age Attitude/Demeanor/Rapport:  Unable to Assess Affect (typically observed):  Unable to Assess Orientation:  Oriented to Self Alcohol / Substance use:  Not Applicable Psych involvement (Current and /or in the community):  No (Comment)  Discharge Needs  Concerns to be addressed:  Discharge Planning Concerns Readmission within the last 30 days:  Yes Current discharge risk:  Chronically ill Barriers to Discharge:  Continued Medical Work up   [employer], LCSW 12/16/2015, 10:21 AM

## 2015-12-16 NOTE — Consult Note (Signed)
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  Abrasion to right medial foot near great toe.  Known to [facility] team Wound type:Neuropathic Pressure Ulcer POA: Yes Measurement:Left plantar foot near great toe  2.5 cm x 2 cm x 0.1 cm  Left plantar midfoot 1.5 cm x 2 cm x 0.2 cm  Right lateral foot 0.5 cm diameter Wound YCX:[id number], devitalized tissue. Drainage (amount, consistency, odor) None noted.  Periwound:Calloused   Dressing procedure/placement/frequency:Cleanse wounds to left foot with NS and pat gently dry.  Paint with betadine swab daily.   Silicone border foam dressing to abrasion right foot.   Will not follow at this time.  Please re-consult if needed.  Domenic Moras RN BSN [place] Pager [phone]

## 2015-12-16 NOTE — Discharge Summary (Signed)
Marie [name], is a 60 y.o. female  DOB [birth year]  MRN [id number].  Admission date:  12/15/2015  Admitting Physician  Gladstone Lighter, MD  Discharge Date:  12/16/2015   Primary MD  Cletis Athens, MD  Recommendations for primary care physician for things to follow:  Going to hospice home today.   Admission Diagnosis  Altered mental status [R41.82] Hypoxia [R09.02]   Discharge Diagnosis  Altered mental status [R41.82] Hypoxia [R09.02]    Active Problems:   Encephalopathy      Past Medical History:  Diagnosis Date  . Arthritis   . Cancer ([place])    Lung CA right wedge removed  . CHF (congestive heart failure) ([place])   . COPD (chronic obstructive pulmonary disease) ([facility])    has oxygen but doesn't use  . Diabetes mellitus type 2 with neurological manifestations ([place])    Peripheral neuropathy and foot ulcers  . Hypertension   . PAD (peripheral artery disease) ([place])   . Peripheral neuropathy ([place])   . Shortness of breath dyspnea     Past Surgical History:  Procedure Laterality Date  . ABDOMINAL HYSTERECTOMY    . BACK SURGERY  x 7  . CARPAL TUNNEL RELEASE Bilateral   . CESAREAN SECTION     x3  . CORONARY ANGIOPLASTY     no stents  . FOOT SURGERY Left    diabetic ulcer  . KNEE ARTHROSCOPY Bilateral   . TONSILLECTOMY    . VIDEO ASSISTED THORACOSCOPY (VATS)/THOROCOTOMY Right 08/26/2014   Procedure: VIDEO ASSISTED THORACOSCOPY (VATS)/THOROCOTOMY;  Surgeon: Nestor Lewandowsky, MD;  Location: [facility] ORS;  Service: General;  Laterality: Right;       History of present illness and  Hospital Course:     Kindly see H&P for history of present illness and admission details, please review complete Labs, Consult reports and Test reports for all details in brief  HPI  from the history and physical done on the day of  admission  60 year old female patient with history of COPD, CHF, diabetes mellitus, peripheral neuropathy, metastatic lung cancer comes in because of generalized weakness, abdominal pain, decrease in functional status.  Hospital Course  1.metastatic lung cancer with metastases to bone, brain. Causing generalized weakness, decreased functional lower status, patient is admitted to hospitalist service for encephalopathy, dehydration. CT head showed metastasis and some vasogenic edema.. Patient started on IV hydration. Patient mental status improved he is alert awake. Started on the diet. I recommend holding her Lantus, glipizide because her by mouth intake is not very good. #2 metastatic lung carcinoma with metastases to liver, bone, brain: Because of her to be discharged today. #3 acute renal failure: Bradycardia with hydration. #4 elevated white count secondary to Decadron. #5. 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  prognosis is  very poor. Patient is stable for discharge to hospice home today. We'll.  Discharge Condition:          Discharge Instructions  and  Discharge Medications        Medication List    STOP taking these medications   aspirin 81 MG EC tablet   budesonide-formoterol 160-4.5 MCG/ACT inhaler Commonly known as:  SYMBICORT   cyclobenzaprine 10 MG tablet Commonly known as:  FLEXERIL   diazepam 5 MG tablet Commonly known as:  VALIUM   diphenhydrAMINE 25 MG tablet Commonly known as:  BENADRYL   DULoxetine 60 MG capsule Commonly known as:  CYMBALTA   gemfibrozil 600 MG tablet Commonly known as:  LOPID   glipiZIDE 5 MG tablet Commonly known as:  GLUCOTROL   ibuprofen 100 MG tablet Commonly known as:  ADVIL,MOTRIN   insulin glargine 100 UNIT/ML injection Commonly known as:  LANTUS   Insulin Glargine 300 UNIT/ML Sopn Commonly known as:  TOUJEO SOLOSTAR   LYRICA 150 MG capsule Generic drug:  pregabalin    metFORMIN 1000 MG tablet Commonly known as:  GLUCOPHAGE   oxyCODONE 5 MG immediate release tablet Commonly known as:  Oxy IR/ROXICODONE   pantoprazole 40 MG tablet Commonly known as:  PROTONIX   promethazine 12.5 MG tablet Commonly known as:  PHENERGAN   rosuvastatin 10 MG tablet Commonly known as:  CRESTOR   senna-docusate 8.6-50 MG tablet Commonly known as:  Senokot-S   sulfamethoxazole-trimethoprim 800-160 MG tablet Commonly known as:  BACTRIM DS,SEPTRA DS     TAKE these medications   acetaminophen 325 MG tablet Commonly known as:  TYLENOL Take 2 tablets (650 mg total) by mouth every 6 (six) hours as needed for mild pain (or Fever >/= 101).   ALPRAZolam 0.25 MG tablet Commonly known as:  XANAX Take 1 tablet (0.25 mg total) by mouth 2 (two) times daily.   dexamethasone 4 MG tablet Commonly known as:  DECADRON Take 1 tablet (4 mg total) by mouth daily.   levETIRAcetam 500 MG tablet Commonly known as:  KEPPRA Take 1 tablet by mouth 2 (two) times daily.         Diet and Activity recommendation: See Discharge Instructions above   Consults obtained -PALLIATIVE CARE   Major procedures and Radiology Reports - PLEASE review detailed and final reports for all details, in brief -     Dg Chest 1 View  Result Date: 12/02/2015 CLINICAL DATA:  Code sepsis.  Lung cancer and altered mental status EXAM: CHEST 1 VIEW COMPARISON:  06/05/2015 FINDINGS: Chronic hazy appearance at the right base where there are lung sutures. Scar in this region on 2016 chest CT. Mild increase in left basilar markings which was also seen previously. No cardiomegaly for technique. Negative aortic and hilar contours. IMPRESSION: Stable compared to prior, including postoperative changes in the right lower lobe. No convincing pneumonia. Electronically Signed   By: Monte Fantasia M.D.   On: 12/02/2015 21:49   Dg Chest 2 View  Result Date: 12/15/2015 CLINICAL DATA:  60 year old female presenting  with hypoxia. History of brain cancer and lung cancer. EXAM: CHEST  2 VIEW COMPARISON:  Chest radiograph dated 12/02/2015 and chest CT dated 09/25/2014 cyst in FINDINGS: There has been interval removal of the previously seen right IJ central line. Interval resolution of the previously seen pulmonary edema. Bibasilar hazy densities, likely atelectatic changes. Right infrahilar opacity similar to prior study and likely corresponding to the postsurgical scarring. There is no pleural effusion or pneumothorax. The cardiac silhouette is within normal limits. No acute osseous pathology. IMPRESSION: Interval removal of the right IJ central line and resolution of the previously seen pulmonary edema. Bibasilar subsegmental atelectatic changes. Stable right infrahilar opacity corresponds to the right lower lobe postsurgical changes. Electronically Signed   By: Anner Crete M.D.   On: 12/15/2015 18:37   Ct Head Wo Contrast  Result Date: 12/15/2015 CLINICAL DATA:  60 year old female with known history of lung cancer with brain metastasis. Patient presenting with altered mental status. EXAM: CT HEAD WITHOUT CONTRAST TECHNIQUE: Contiguous axial images were obtained from the base of the skull through the vertex without intravenous contrast. COMPARISON:  Head CT dated 12/08/2015 an MRI dated  12/10/2015. FINDINGS: The ventricles and sulci are appropriate in size for patient's age. Mild periventricular and deep white matter chronic microvascular ischemic changes noted. There is a stable area of vasogenic edema in the right parietal convexity white matter suspicious for underlying metastatic disease or recurrence. A wedge-shaped area of hypodensity in the right cerebellar hemisphere appear similar to the CT dated 12/08/2015 and corresponds to the infarcts seen on the MRI dated 12/10/2015. There is a 1.4 x 1.4 cm hypo attenuating lesion in the superior aspect of the left cerebellar hemisphere which appears new from prior CT and  MRI. Although this may represent vasogenic edema related to underlying metastatic disease, an acute ischemia is more favored given sudden interval development since the most recent study of 12/10/2015. There is a small stable lacunar infarct in the left centrum semiovale. There is no acute intracranial hemorrhage. No midline shift identified. The visualized paranasal sinuses and mastoid air cells are clear. Right parietal craniotomy again noted. IMPRESSION: No acute intracranial hemorrhage. Stable appearing right parietal convexity white matter vasogenic edema as seen on the prior studies concerning for underlying metastatic disease or recurrence. Interval development of the focal hypodensity in the superior aspect of the left cerebellar hemisphere along the tentorium which although may be related to edema and metastatic disease, is favored to represent a focal area of acute infarct given sudden interval development since the most recent study. Clinical correlation is recommended. MRI without and with contrast may provide better evaluation. Right cerebellar hemisphere wedge-shaped hypodensity similar to prior study, likely an infarct. These results were called by telephone at the time of interpretation on 12/15/2015 at 9:51 pm to Dr. Gladstone Lighter , who verbally acknowledged these results. Electronically Signed   By: Anner Crete M.D.   On: 12/15/2015 21:51   Ct Head Wo Contrast  Result Date: 12/08/2015 CLINICAL DATA:  "Not feeling well" in a patient with Brain cancer. Records in epic care everywhere document a previous CT scan performed at [place] on 10/26/2015 for history of adenocarcinoma and enhancing dural-based lesion in the right parietal lobe. EXAM: CT HEAD WITHOUT CONTRAST TECHNIQUE: Contiguous axial images were obtained from the base of the skull through the vertex without intravenous contrast. COMPARISON:  None. FINDINGS: Brain: High right parietal vasogenic edema is noted  with a probable peripheral 10 mm hyper attenuating lesion (image 26 series 2). Overlying craniotomy defect is evident. 8 mm hypo attenuating lesion is identified in the right thalamus, not described previously. Wedge-shaped area of hypoattenuation identified in the inferior right cerebellar hemisphere. On imaging today, this has an appearnace of encephalomalacia related infarct, but could certainly represent vasogenic edema from mass lesion. This finding was not described up on the CT report from [place] about 1 month ago, suggesting that it is a new interval finding. No evidence for midline shift. No hydrocephalus. No CT findings of acute hemorrhage. Vascular: Atherosclerotic calcification is visualized in the carotid arteries. No dense MCA sign. Major dural sinuses are unremarkable. Skull: High right parietal craniotomy defect noted. Sinuses/Orbits: The visualized paranasal sinuses and mastoid air cells are clear. Visualized portions of the globes and intraorbital fat are unremarkable. Other: N/A IMPRESSION: 1. Chronic right parietal vasogenic edema with probable 10 mm hyper attenuating lesion towards the vertex. Overlying craniotomy defect suggest previous surgery in this region and vasogenic edema was described in this area on previous outside CT report from [place] dated 10/26/2015. 2. 8 mm hypo attenuating lesion right down  was not described previously. Age indeterminate lacunar infarction could have this appearance. MRI without and with contrast may prove helpful to further evaluate. 3. Peripheral wedge-shaped area of apparent encephalomalacia in the inferior right cerebellar hemisphere from so not described previously. This could represent an area of post ischemic encephalomalacia but given the history of metastatic disease to the brain, vasogenic edema from metastatic deposit cannot be entirely excluded. This could also be better assessed by MRI. 4. No  hydrocephalus or midline shift. No evidence for acute hemorrhage. Electronically Signed   By: Misty Stanley M.D.   On: 12/08/2015 20:07   Mr Jeri Cos GN Contrast  Result Date: 12/10/2015 CLINICAL DATA:  60 year old female with intractable nausea vomiting. Lung cancer with metastatic disease. Previous vertex craniotomy with underlying vasogenic edema on noncontrast head CT 2 days ago. Initial encounter. EXAM: MRI HEAD WITHOUT AND WITH CONTRAST TECHNIQUE: Multiplanar, multiecho pulse sequences of the brain and surrounding structures were obtained without and with intravenous contrast. CONTRAST:  76m MULTIHANCE GADOBENATE DIMEGLUMINE 529 MG/ML IV SOLN COMPARISON:  Head CT without contrast 12/08/2015. Records in epic care everywhere document a [place] CT [place] for history of adenocarcinoma and enhancing dural-based lesion in the right parietal lobe. FINDINGS: Sequelae of right vertex craniotomy. Widespread pachymeningeal thickening and enhancement over the superior brain (series 11, image 16). Resection cavity at the superior right parietal lobe with overlying slightly more thick and irregular dural or meningeal enhancement (series 12, image 12 and series 11, image 23. Subjacent T2 and FLAIR hyperintensity in the right parietal and posterior right frontal lobes with mild gyral expansion and regional mass effect (series 6, image 20). Superimposed small 5 mm focus of white matter restricted diffusion a mix the area of vasogenic edema as seen on series 100 and series 101, image 41. Furthermore, there is confluent 3 cm area of trace diffusion abnormality in the inferior right cerebellum. Isointense to slightly restricted on ADC. Associated T2 and FLAIR hyperintensity without hemorrhage or mass effect. There is T2 and FLAIR hyperintensity in the medial right thalamus with increased trace diffusion signal but mild if any diffusion restriction. There are small foci  ranging from punctate to 5 mm in the left occipital and parietal lobes with possible restricted diffusion. Mild if any associated T2 and FLAIR signal changes in those areas. No acute intracranial hemorrhage identified. No enhancement at any of these areas. Major intracranial vascular flow voids are preserved. Confluent T2 hyperintensity in the pons and scattered small areas of nonspecific white matter T2 and FLAIR hyperintensity in addition to those described above are noted. No chronic cerebral blood products except at the right parietal resection cavity. No ventriculomegaly. Patent basilar cisterns. Negative pituitary, cervicomedullary junction and visualized cervical spine. Visualized bone marrow signal is within normal limits. Small volume postoperative extra-axial collection overlying the resection cavity. Visible internal auditory structures appear normal. Trace right mastoid effusion. Paranasal sinuses are clear. No acute orbit or scalp soft tissue findings. IMPRESSION: 1. Early subacute appearing - mostly lacunar type - infarcts in the right PICA territory, scattered both PCA territories, and also in the right MCA/PCA territory (in the area of vasogenic edema described in #2). No associated hemorrhage or infarct related mass effect. 2. Right parietal craniotomy and resection cavity. Mildly irregular dural/meningeal thickening at the resection site with regional vasogenic edema and mild mass effect. This appearance is suspicious for metastatic disease recurrence, and comparison with previous brain MRIs is recommended. 3. No other  metastatic disease to the brain suspected; widespread smooth dural thickening over both hemispheres is favored to be postoperative in nature. Electronically Signed   By: Genevie Ann M.D.   On: 12/10/2015 10:46   Ct Abdomen Pelvis W Contrast  Result Date: 12/03/2015 CLINICAL DATA:  Severe sepsis. Right lower quadrant abdominal pain. Lung cancer. EXAM: CT ABDOMEN AND PELVIS WITH  CONTRAST TECHNIQUE: Multidetector CT imaging of the abdomen and pelvis was performed using the standard protocol following bolus administration of intravenous contrast. CONTRAST:  146m ISOVUE-300 IOPAMIDOL (ISOVUE-300) INJECTION 61% COMPARISON:  Head CT 07/09/2014.  Chest CT 09/25/2014 FINDINGS: Lower chest and abdominal wall: Partly seen ill-defined airspace opacity in the right middle lobe. There are new pulmonary nodules in the bibasilar lungs, largest in the right posterior costophrenic sulcus measuring 12 mm. Per report in care everywhere 10/23/2015 these are new/progressed. Septal thickening consistent with edema. Hepatobiliary: Numerous low-density liver masses that are new compared to chest CT comparison. These measure up to 3 cm in size in multiple segments. Although low-density they are likely solid. Multi focal abscess considered unlikely.Cholecystectomy. Normal common bile duct diameter. Pancreas: Unremarkable. Spleen: Unremarkable. Adrenals/Urinary Tract: Negative adrenals. Patchy heterogeneous hypo enhancement in the kidneys having ill-defined masslike appearance. Pyelonephritis is considered given the clinical circumstances, but metastases are favored. Unremarkable bladder. Stomach/Bowel:  No obstruction. No appendicitis. Reproductive:Hysterectomy. Possible right oophorectomy. Negative left ovary. Vascular/Lymphatic: No acute vascular abnormality. Mild enlargement of lymph nodes in the deep liver drainage. Other: No ascites or pneumoperitoneum. Musculoskeletal: No acute or aggressive finding. L3-4 and L4-5 discectomy with solid bony fusion. IMPRESSION: 1. Partly seen right middle lobe pneumonia. Nodules in the bilateral lungs are primarily concerning for metastatic foci, but hematogenous infection is not excluded as these have rapidly developed compared to 10/23/2015 chest CT report. 2. Pulmonary edema. 3. Abnormal bilateral renal perfusion which could be pyelonephritis or metastases. Correlate with  urinalysis and follow-up. 4. Numerous hepatic metastases. Electronically Signed   By: JMonte FantasiaM.D.   On: 12/03/2015 00:23   UKoreaCarotid Bilateral  Result Date: 12/10/2015 CLINICAL DATA:  CVA. EXAM: BILATERAL CAROTID DUPLEX ULTRASOUND TECHNIQUE: GPearline Cablesscale imaging, color Doppler and duplex ultrasound were performed of bilateral carotid and vertebral arteries in the neck. COMPARISON:  MRI 12/10/2015 . FINDINGS: Criteria: Quantification of carotid stenosis is based on velocity parameters that correlate the residual internal carotid diameter with NASCET-based stenosis levels, using the diameter of the distal internal carotid lumen as the denominator for stenosis measurement. The following velocity measurements were obtained: RIGHT ICA:  106/30 sec cm/sec CCA:  1789/38a cm/sec SYSTOLIC ICA/CCA RATIO:  01.01DIASTOLIC ICA/CCA RATIO:  17.51ECA:  147 cm/sec LEFT ICA:  74/21 cm/sec CCA:  1025/85cm/sec SYSTOLIC ICA/CCA RATIO:  0.7 DIASTOLIC ICA/CCA RATIO:  1.1 ECA:  147 cm/sec RIGHT CAROTID ARTERY: Mild right carotid bifurcation atherosclerotic vascular disease. No flow limiting stenosis. RIGHT VERTEBRAL ARTERY:  Patent with antegrade flow. LEFT CAROTID ARTERY: Mild left carotid bifurcation atherosclerotic vascular disease. No flow limiting stenosis. LEFT VERTEBRAL ARTERY:  Patent with antegrade flow. IMPRESSION: 1. 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 No flow limiting stenosis. Degree of stenosis less than 50% bilaterally. 2. Vertebral arteries are patent with antegrade flow. Electronically Signed   By: TMarcello Moores Register   On: 12/10/2015 15:28   Dg Chest Portable 1 View  Result Date: 12/02/2015 CLINICAL DATA:  Central line placement EXAM: PORTABLE CHEST 1 VIEW COMPARISON:  Earlier today FINDINGS: New right IJ central line with tip at the  SVC level. No pneumothorax or new mediastinal widening. New bilateral interstitial prominence suggesting edema. Normal heart size and stable  mediastinal contours. Postoperative changes in the right lower lobe are becoming obscured. IMPRESSION: 1. New central line without adverse finding. 2. New pulmonary edema. Electronically Signed   By: Monte Fantasia M.D.   On: 12/02/2015 23:37   Dg Foot Complete Left  Result Date: 11/24/2015 CLINICAL DATA:  Open wound on left foot, initial encounter EXAM: LEFT FOOT - COMPLETE 3+ VIEW COMPARISON:  10/31/2014 FINDINGS: Changes in the left second toe which have progressed somewhat in the interval from the prior exam particularly in the proximal phalanx. Soft tissue wound is again noted. No frank bony destruction to suggest osteomyelitis is seen. Clinical correlation is recommended. IMPRESSION: Some mild progressive loss at the base of the second proximal phalanx. Associated soft tissue wound is noted although no definitive bony destruction is seen. Electronically Signed   By: Inez Catalina M.D.   On: 11/24/2015 16:20   Dg Hip Unilat With Pelvis 2-3 Views Left  Result Date: 12/10/2015 CLINICAL DATA:  60 year old female with acute onset bilateral hip pain with no known injury. Initial encounter. Prior right hip arthroplasty. EXAM: DG HIP (WITH OR WITHOUT PELVIS) 2-3V LEFT COMPARISON:  Right hip series from today reported separately. CT Abdomen and Pelvis 12/02/2015. FINDINGS: Pelvis appears intact. Previous lumbar spine surgery. Right hip arthroplasty changes. Moderate left hip joint space loss with subchondral sclerosis and osteophytosis of the acetabulum. Proximal left femur intact. No acute osseous abnormality identified. IMPRESSION: 1. Moderate left hip osteoarthritis. No acute osseous abnormality identified. 2. Right hip series today reported separately. Electronically Signed   By: Genevie Ann M.D.   On: 12/10/2015 10:55   Dg Hip Unilat With Pelvis 2-3 Views Right  Result Date: 12/10/2015 CLINICAL DATA:  60 year old female with acute onset bilateral hip pain with no known injury. Initial encounter. Prior  right hip arthroplasty. EXAM: DG HIP (WITH OR WITHOUT PELVIS) 2-3V RIGHT COMPARISON:  CT Abdomen and Pelvis 12/02/2015. Right hip series 01/24/12. FINDINGS: Lower lumbar spine postoperative changes also noted. Right hip arthroplasty hardware appears stable and intact. Pelvis intact. SI joints within normal limits. Moderate left hip joint space loss with acetabular subchondral sclerosis and osteophytosis. No acute osseous abnormality identified. Negative visible bowel gas pattern. IMPRESSION: 1. No acute osseous abnormality identified. About the right hip per pelvis. 2. Right hip arthroplasty hardware appears intact and normally aligned. 3. Left hip reported separately. Electronically Signed   By: Genevie Ann M.D.   On: 12/10/2015 10:54    Micro Results    No results found for this or any previous visit (from the past 240 hour(s)).     Today   Subjective:   Marie [name] is seen,and she will go to Hospice home.  Objective:   Blood pressure 101/60, pulse 73, temperature 98.5 F (36.9 C), temperature source Oral, resp. rate 18, height '5\' 2"'$  (1.575 m), weight 85.3 kg (188 lb), SpO2 90 %.   Intake/Output Summary (Last 24 hours) at 12/16/15 1141 Last data filed at 12/16/15 1032  Gross per 24 hour  Intake              653 ml  Output             1200 ml  Net             -547 ml    Exam Awake Alert, Oriented x 3, No new F.N deficits, Normal affect So-Hi.AT,PERRAL Supple  Neck,No JVD, No cervical lymphadenopathy appriciated.  Symmetrical Chest wall movement, Good air movement bilaterally, CTAB RRR,No Gallops,Rubs or new Murmurs, No Parasternal Heave +ve B.Sounds, Abd Soft, Non tender, No organomegaly appriciated, No rebound -guarding or rigidity. No Cyanosis, Clubbing or edema, No new Rash or bruise  Data Review   CBC w Diff:  Lab Results  Component Value Date   WBC 22.2 (H) 12/16/2015   HGB 11.7 (L) 12/16/2015   HGB 14.4 07/24/2014   HCT 35.5 12/16/2015   HCT 43.2 07/24/2014   PLT  202 12/16/2015   PLT 328 07/24/2014   LYMPHOPCT 4 12/15/2015   LYMPHOPCT 35.2 07/24/2014   MONOPCT 2 12/15/2015   MONOPCT 5.0 07/24/2014   EOSPCT 2 12/15/2015   EOSPCT 3.0 07/24/2014   BASOPCT 0 12/15/2015   BASOPCT 0.7 07/24/2014    CMP:  Lab Results  Component Value Date   NA 134 (L) 12/16/2015   NA 134 (L) 07/24/2014   K 3.7 12/16/2015   K 4.2 07/24/2014   CL 98 (L) 12/16/2015   CL 100 (L) 07/24/2014   CO2 23 12/16/2015   CO2 25 07/24/2014   BUN 52 (H) 12/16/2015   BUN 26 (H) 07/24/2014   CREATININE 1.22 (H) 12/16/2015   CREATININE 0.71 07/24/2014   PROT 6.6 12/16/2015   PROT 8.7 (H) 07/24/2014   ALBUMIN 2.0 (L) 12/16/2015   ALBUMIN 4.5 07/24/2014   BILITOT 0.2 (L) 12/16/2015   BILITOT 0.5 07/24/2014   ALKPHOS 360 (H) 12/16/2015   ALKPHOS 159 (H) 07/24/2014   AST 49 (H) 12/16/2015   AST 39 07/24/2014   ALT 56 (H) 12/16/2015   ALT 37 07/24/2014  .   Total Time in preparing paper work, data evaluation and todays exam - 64 minutes  Nekita Pita M.D on 12/16/2015 at 11:41 AM    Note: This dictation was prepared with Dragon dictation along with smaller phrase technology. Any transcriptional errors that result from this process are unintentional.

## 2015-12-16 NOTE — Progress Notes (Signed)
Per Dr. Vianne Bulls, patient may have regular diet, verbal in person.

## 2015-12-16 NOTE — Consult Note (Signed)
Consultation Note Date: 12/16/2015   Patient Name: Marie [name]  DOB: 12-22-1955  MRN: [id number]  Age / Sex: 60 y.o., female  PCP: Cletis Athens, MD Referring Physician: Epifanio Lesches, MD  Reason for Consultation: Establishing goals of care, Non pain symptom management, Pain control and Psychosocial/spiritual support  HPI/Patient Profile: 60 y.o. 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   Patient was recently here in the hospital last week and was diagnosed with brain metastases and also acute infarcts.   Received whole brain radiation and was treated for UTI and sepsis.   She was discharged home, however significant other is unable to take care of the patient at home. Significant physical, functional and cognitive decline.  She continues to be confused, extremely weak and only in a wheelchair at this time. Patient is very confused at  times.   Faced with advanced directive decisions and anticipatory care needs.    Clinical Assessment and Goals of Care:  This NP Wadie Lessen reviewed medical records, received report from team, assessed the patient and then meet at the patient's bedside  to discuss diagnosis, prognosis, GOC, EOL wishes disposition and options.  A  discussion was had today regarding advanced directives.  Concepts specific to code status, artifical feeding and hydration, continued IV antibiotics and rehospitalization was had.  The difference between a aggressive medical intervention path  and a palliative comfort care path for this patient at this time was had.  Values and goals of care important to patient and family were attempted to be elicited.   Concept of Hospice and Palliative  Care were discussed  Natural trajectory and expectations at EOL were discussed.  Questions and concerns addressed.     PMT will continue to support holistically.  Patient is hopeful for shift to full comfort, allowing a naturall death.   I later in the morning meet with her SO Marie [name] and he supports decision for comfort at a hospice facility.     SUMMARY OF RECOMMENDATIONS    Code Status/Advance Care Planning:  DNR   Symptom Management:   Pain/Dyspnea: Morphine 2 mg IV every 1 hr prn  Anxiety: Ativan 1 mg IV every 4 hrs prn  Palliative Prophylaxis:   Aspiration, Bowel Regimen, Delirium Protocol, Frequent Pain Assessment and Oral Care  Additional Recommendations (Limitations, Scope, Preferences):  Full Comfort Care  Psycho-social/Spiritual:   Desire for further Chaplaincy support:no  Additional Recommendations: Education on Hospice  Prognosis:   < 2 weeks, full comfort, poor po intake, heavy symptom burden  Discharge Planning: Hospice facility      Primary Diagnoses: Present on Admission: . Encephalopathy   I have reviewed the medical record, interviewed the patient and family, and examined the patient. The following aspects are pertinent.  Past Medical History:  Diagnosis Date  . Arthritis   . Cancer ([place])  Lung CA right wedge removed  . CHF (congestive heart failure) ([place])   . COPD (chronic obstructive pulmonary disease) ([facility])    has oxygen but doesn't use  . Diabetes mellitus type 2 with neurological manifestations ([place])    Peripheral neuropathy and foot ulcers  . Hypertension   . PAD (peripheral artery disease) ([place])   . Peripheral neuropathy ([place])   . Shortness of breath dyspnea    Social History   Social History  . Marital status: Widowed    Spouse name: N/A  . Number of children: N/A  . Years of education: N/A   Social History Main Topics  . Smoking status: Current Every Day Smoker    Packs/day: 0.50    Years: 46.00    Types:  Cigarettes  . Smokeless tobacco: Never Used  . Alcohol use No  . Drug use: No  . Sexual activity: Not Asked   Other Topics Concern  . None   Social History Narrative   Lives at home with boyfriend.   Family History  Problem Relation Age of Onset  . Hypertension Brother   . Coronary artery disease Mother   . Diabetes Mellitus II Mother   . Coronary artery disease Father   . Diabetes Mellitus II Father   . Diabetes Mellitus II Brother    Scheduled Meds: . ALPRAZolam  0.25 mg Oral BID  . aspirin EC  81 mg Oral Daily  . chlorhexidine  15 mL Mouth/Throat BID  . dexamethasone  4 mg Oral Daily  . diphenhydrAMINE  25 mg Oral QHS  . DULoxetine  60 mg Oral Daily  . enoxaparin (LOVENOX) injection  40 mg Subcutaneous Q24H  . gemfibrozil  600 mg Oral BID AC  . insulin aspart  0-5 Units Subcutaneous QHS  . insulin aspart  0-9 Units Subcutaneous TID WC  . insulin glargine  20 Units Subcutaneous QHS  . levETIRAcetam  500 mg Oral BID  . metFORMIN  500 mg Oral BID WC  . mometasone-formoterol  2 puff Inhalation BID  . pantoprazole  40 mg Oral Daily  . pregabalin  150 mg Oral BID  . rosuvastatin  10 mg Oral Daily   Continuous Infusions: . sodium chloride 75 mL/hr at 12/15/15 2148   PRN Meds:.acetaminophen **OR** acetaminophen, cyclobenzaprine, morphine injection, ondansetron **OR** ondansetron (ZOFRAN) IV, senna-docusate Medications Prior to Admission:  Prior to Admission medications   Medication Sig Start Date End Date Taking? Authorizing Provider  ALPRAZolam (XANAX) 0.25 MG tablet Take 1 tablet (0.25 mg total) by mouth 2 (two) times daily. 11/18/15  Yes Cammie Sickle, MD  aspirin EC 81 MG EC tablet Take 1 tablet (81 mg total) by mouth daily. 12/12/15  Yes Srikar Sudini, MD  budesonide-formoterol (SYMBICORT) 160-4.5 MCG/ACT inhaler Inhale 2 puffs into the lungs 2 (two) times daily. Reported on 05/20/2015   Yes Historical Provider, MD  dexamethasone (DECADRON) 4 MG tablet Take 1 tablet  (4 mg total) by mouth daily. 12/12/15  Yes Srikar Sudini, MD  diazepam (VALIUM) 5 MG tablet Take 2 mg by mouth daily.  08/15/14  Yes Historical Provider, MD  diphenhydrAMINE (BENADRYL) 25 MG tablet Take 25 mg by mouth at bedtime.   Yes Historical Provider, MD  DULoxetine (CYMBALTA) 60 MG capsule Take 60 mg by mouth daily.   Yes Historical Provider, MD  gemfibrozil (LOPID) 600 MG tablet Take 600 mg by mouth 2 (two) times daily before a meal.   Yes Historical Provider, MD  glipiZIDE (GLUCOTROL) 5 MG tablet  Take 5 mg by mouth 2 (two) times daily before a meal.  06/26/14  Yes Historical Provider, MD  ibuprofen (ADVIL,MOTRIN) 100 MG tablet Take 100 mg by mouth every 6 (six) hours as needed for fever.   Yes Historical Provider, MD  insulin glargine (LANTUS) 100 UNIT/ML injection Inject 25 Units into the skin at bedtime.   Yes Historical Provider, MD  metFORMIN (GLUCOPHAGE) 1000 MG tablet Take 500 mg by mouth 2 (two) times daily with a meal.  05/19/15  Yes Historical Provider, MD  oxyCODONE (OXY IR/ROXICODONE) 5 MG immediate release tablet Take 1 tablet (5 mg total) by mouth every 4 (four) hours as needed for breakthrough pain. 1-2 tabs every 4 hours as needed for severe pain 12/12/15  Yes Srikar Sudini, MD  pantoprazole (PROTONIX) 40 MG tablet Take 40 mg by mouth daily.  10/28/15 10/27/16 Yes Historical Provider, MD  promethazine (PHENERGAN) 12.5 MG tablet Take 1 tablet (12.5 mg total) by mouth every 6 (six) hours as needed for nausea or vomiting. 12/12/15  Yes Srikar Sudini, MD  rosuvastatin (CRESTOR) 10 MG tablet Take 10 mg by mouth daily.   Yes Historical Provider, MD  senna-docusate (SENOKOT-S) 8.6-50 MG tablet Take 1 tablet by mouth daily as needed for mild constipation.  10/28/15 10/27/16 Yes Historical Provider, MD  cyclobenzaprine (FLEXERIL) 10 MG tablet Take 10 mg by mouth 3 (three) times daily as needed for muscle spasms.     Historical Provider, MD  Insulin Glargine (TOUJEO SOLOSTAR) 300 UNIT/ML SOPN Inject 50  Units into the skin daily. Patient not taking: Reported on 12/15/2015 12/07/15   Henreitta Leber, MD  levETIRAcetam (KEPPRA) 500 MG tablet Take 1 tablet by mouth 2 (two) times daily. 12/01/15   Historical Provider, MD  LYRICA 150 MG capsule Take 150 mg by mouth 2 (two) times daily.  10/29/15   Historical Provider, MD  sulfamethoxazole-trimethoprim (BACTRIM DS,SEPTRA DS) 800-160 MG tablet Take 1 tablet by mouth 2 (two) times daily. Patient not taking: Reported on 12/15/2015 12/07/15   Henreitta Leber, MD   Allergies  Allergen Reactions  . Ampicillin Anaphylaxis  . Penicillins Anaphylaxis and Shortness Of Breath    Has patient had a PCN reaction causing immediate rash, facial/tongue/throat swelling, SOB or lightheadedness with hypotension: yes Has patient had a PCN reaction causing severe rash involving mucus membranes or skin necrosis: no Has patient had a PCN reaction that required hospitalization no Has patient had a PCN reaction occurring within the last 10 years: no If all of the above answers are "NO", then may proceed with Cephalosporin use.   . Avelox [Moxifloxacin Hcl In Nacl] Other (See Comments)    Reaction: MUSCLE CRAMPS  . Etodolac Rash  . Neurontin [Gabapentin] Anxiety   Review of Systems  Constitutional: Positive for appetite change and unexpected weight change.  Gastrointestinal: Positive for abdominal pain.  Musculoskeletal: Positive for back pain.  Neurological: Positive for weakness and headaches.    Physical Exam  Constitutional: She appears lethargic. She appears cachectic. She appears ill.  HENT:  Mouth/Throat: Mucous membranes are pale and dry.  Cardiovascular: Normal rate, regular rhythm and normal heart sounds.   Pulmonary/Chest: She has decreased breath sounds in the right lower field and the left lower field.  Abdominal: She exhibits distension. There is tenderness.  Musculoskeletal:  -generalized weakness and atrophy  Neurological: She appears lethargic.    Skin: Skin is dry and intact. There is pallor.    Vital Signs: BP 101/60 (BP Location: Right Arm)  Pulse 73   Temp 98.5 F (36.9 C) (Oral)   Resp 18   Ht '5\' 2"'$  (1.575 m)   Wt 85.3 kg (188 lb)   SpO2 90%   BMI 34.39 kg/m  Pain Assessment: 0-10   Pain Score: 7    SpO2: SpO2: 90 % O2 Device:SpO2: 90 % O2 Flow Rate: .O2 Flow Rate (L/min): 2 L/min  IO: Intake/output summary:  Intake/Output Summary (Last 24 hours) at 12/16/15 0943 Last data filed at 12/16/15 5789  Gross per 24 hour  Intake              653 ml  Output              550 ml  Net              103 ml    LBM: Last BM Date: 12/15/15 (per pt) Baseline Weight: Weight: 85.3 kg (188 lb) Most recent weight: Weight: 85.3 kg (188 lb)      Palliative Assessment/Data: 30 % at best   Flowsheet Rows   Flowsheet Row Most Recent Value  Intake Tab  Referral Department  Hospitalist  Unit at Time of Referral  Oncology Unit  Palliative Care Primary Diagnosis  Cancer  Date Notified  12/15/15  Palliative Care Type  Return patient Palliative Care  Reason for referral  Clarify Goals of Care  Date of Admission  12/15/15  # of days IP prior to Palliative referral  0  Clinical Assessment  Psychosocial & Spiritual Assessment  Palliative Care Outcomes     Discussed with Dr Vianne Bulls Time In: 0815 Time Out: 0930 Time Total: 75 min Greater than 50%  of this time was spent counseling and coordinating care related to the above assessment and plan.  Signed by: Wadie Lessen, NP   Please contact Palliative Medicine Team phone at [phone] for questions and concerns.  For individual provider: See Shea Evans

## 2015-12-16 NOTE — Progress Notes (Signed)
New hospice home referral received from [facility] following a Palliative Medicine Consult. Marie [name] is a 60 year old woman with a known history of metastatic lung cancer which includes her brain, liver and bone. She has a PMH of PAD,COPD, DM II, peripheral neuropathy and arthritis. She was recently discharged from [facility] on 9/2 after being treated for intractable nausea/vomiting, with a new finding of brain metastases as well as infarcts. She received brain radiation during that admission. She lives at home with her son and significant other, Marie [name]. She was readmitted on 9/5 due to weakness and the inability for her significant other to care for her. She has required IV morphine for pain and an increase in her xanax for anxiety. Writer met in the patient's room with she and [employer]. Her son Marie [name] was present but was not involved in the conversation. Education was initiated regarding hospice services, philosophy and team approach to care with good understating voiced. Questions answered, consents signed. [name] remained alert but drowsy through out the visit. Patient information faxed to hospice referral, report called to the hospice home, EMS notified for transport. Hospital care team all aware of plan for transfer to the hospice home today. Signed portable DNR in place in discharge packet. Thank you for the opportunity to be involved in the care of this patient and her family.  Flo Shanks RN, BSN, [place] [facility] [place], [facility] [phone] c

## 2015-12-16 NOTE — Progress Notes (Signed)
Per Dr. Vianne Bulls patient may have IV morphine 2 mg q4h prn mod/ severe pain. Verbal in person

## 2015-12-16 NOTE — Progress Notes (Signed)
Patient is alert with intermittent confusion, c/o abdominal pain improved with morphine, sleeping in between care, drowsy at times, on 2 l oxygen, son/ significant other visiting at bedside, foley placed for end of life care, pt discharged to hospice home, awaiting on EMS transport.

## 2015-12-16 NOTE — Progress Notes (Signed)
IV removed, foley in placed for end of life care, Pt discharged to hospice home via EMS.

## 2015-12-16 NOTE — Progress Notes (Signed)
CSW was informed by [facility] Liaison that there is a bed available today. CSW informed MD. Provided discharge packet. Patient will discharge to [facility] today 12/16/15 via EMS.  Ernest Pine, MSW, LCSW, [place] Clinical Social Worker [phone]

## 2015-12-17 ENCOUNTER — Ambulatory Visit: Payer: Medicare Other

## 2015-12-18 ENCOUNTER — Ambulatory Visit: Payer: Medicare Other

## 2016-01-06 LAB — SURGICAL PATHOLOGY

## 2016-01-07 DIAGNOSIS — J441 Chronic obstructive pulmonary disease with (acute) exacerbation: Secondary | ICD-10-CM | POA: Diagnosis not present

## 2016-01-10 NOTE — Discharge Summary (Signed)
[facility] NAME: Marie [name]    MR#:  [id number]  DATE OF BIRTH:  July 29, 1955  DATE OF ADMISSION:  12/08/2015 ADMITTING PHYSICIAN: Lance Coon, MD  DATE OF DISCHARGE: 12/12/2015  4:50 PM  PRIMARY CARE PHYSICIAN: MASOUD,JAVED, MD   ADMISSION DIAGNOSIS:  Weakness [R53.1]  DISCHARGE DIAGNOSIS:  Principal Problem:   Intractable nausea and vomiting Active Problems:   Malignant neoplasm of lower lobe of right lung (HCC)   Brain metastases ([place])   Poorly controlled diabetes mellitus ([place])   DNR (do not resuscitate)   Pain, cancer   Palliative care by specialist   Weakness   Acute CVA (cerebrovascular accident) ([place])   SECONDARY DIAGNOSIS:   Past Medical History:  Diagnosis Date  . Arthritis   . Cancer ([place])    Lung CA right wedge removed  . CHF (congestive heart failure) ([place])   . COPD (chronic obstructive pulmonary disease) (HCC)    has oxygen but doesn't use  . Diabetes mellitus type 2 with neurological manifestations ([place])    Peripheral neuropathy and foot ulcers  . Hypertension   . PAD (peripheral artery disease) ([place])   . Peripheral neuropathy ([place])   . Shortness of breath dyspnea      ADMITTING HISTORY  Marie [name]  is a 60 y.o. female who presents with Weakness, poor appetite, intractable nausea. Patient was recently discharged from our hospital after stay for sepsis, and when she went home was unable to eat or take her meds. On reevaluation the ED today she was found to have new brain metastases. Oncology was contacted from the ED and recommended admission, they will follow along with the patient with recommendation for further treatment. Hospitalists were called for admission  HOSPITAL COURSE:   Marie [name] is a 60 y.o. female presenting with Weakness . Admitted 12/08/2015   * Subacute lacunar  infarcts in the right PICA territory,PCA territories, and also in the right MCA/PCA territory ASA,  Statin Carotid dopplers-Nothing acute Recent Echo showed no thrombus  * Lung cancer with Brain metastases Decadron, dose reduced today. Continue discharge and follow-up at the cancer center. Radiation treatments were done during the hospital stay Discussed with oncology Dr. Gaspar Garbe.  Poor prognosis. Patient will need palliative care follow-up. Likely transition to hospice care or hospice home if any deterioration.  * Chronic vomiting Continue Zofran when necessary. Added scheduled Reglan.  * Type 2 diabetes: Restart home medications, increase dose of Lantus, increase sliding coverage-patient   * DVT prophylaxis Lovenox  Patient is being discharged home with guarded prognosis. High risk for readmission.  CONSULTS OBTAINED:  Treatment Team:  Catarina Hartshorn, MD Alexis Goodell, MD  DRUG ALLERGIES:   Allergies  Allergen Reactions  . Ampicillin Anaphylaxis  . Penicillins Anaphylaxis and Shortness Of Breath    Has patient had a PCN reaction causing immediate rash, facial/tongue/throat swelling, SOB or lightheadedness with hypotension: yes Has patient had a PCN reaction causing severe rash involving mucus membranes or skin necrosis: no Has patient had a PCN reaction that required hospitalization no Has patient had a PCN reaction occurring within the last 10 years: no If all of the above answers are "NO", then may proceed with Cephalosporin use.   . Avelox [Moxifloxacin Hcl In Nacl] Other (See Comments)    Reaction: MUSCLE CRAMPS  . Etodolac Rash  . Neurontin [Gabapentin] Anxiety    DISCHARGE MEDICATIONS:   Discharge Medication List as of 12/12/2015  1:16 PM  START taking these medications   Details  aspirin EC 81 MG EC tablet Take 1 tablet (81 mg total) by mouth daily., Starting Sat 12/12/2015, Print      CONTINUE these medications which have CHANGED   Details  dexamethasone (DECADRON) 4 MG tablet Take 1 tablet (4 mg total) by mouth daily., Starting Sat  12/12/2015, Print    oxyCODONE (OXY IR/ROXICODONE) 5 MG immediate release tablet Take 1 tablet (5 mg total) by mouth every 4 (four) hours as needed for breakthrough pain. 1-2 tabs every 4 hours as needed for severe pain, Starting Sat 12/12/2015, Print    promethazine (PHENERGAN) 12.5 MG tablet Take 1 tablet (12.5 mg total) by mouth every 6 (six) hours as needed for nausea or vomiting., Starting Sat 12/12/2015, Normal      CONTINUE these medications which have NOT CHANGED   Details  ALPRAZolam (XANAX) 0.25 MG tablet Take 1 tablet (0.25 mg total) by mouth 2 (two) times daily., Starting Wed 11/18/2015, Print    levETIRAcetam (KEPPRA) 500 MG tablet Take 1 tablet by mouth 2 (two) times daily., Starting Tue 12/01/2015, Historical Med    budesonide-formoterol (SYMBICORT) 160-4.5 MCG/ACT inhaler Inhale 2 puffs into the lungs 2 (two) times daily. Reported on 05/20/2015, Historical Med    cyclobenzaprine (FLEXERIL) 10 MG tablet Take 10 mg by mouth 3 (three) times daily. , Historical Med    diazepam (VALIUM) 5 MG tablet Take 2 mg by mouth daily. , Starting Fri 08/15/2014, Historical Med    diphenhydrAMINE (BENADRYL) 25 MG tablet Take 25 mg by mouth at bedtime., Until Discontinued, Historical Med    DULoxetine (CYMBALTA) 60 MG capsule Take 60 mg by mouth daily., Historical Med    gemfibrozil (LOPID) 600 MG tablet Take 600 mg by mouth 2 (two) times daily before a meal., Historical Med    glipiZIDE (GLUCOTROL) 5 MG tablet Take 5 mg by mouth 2 (two) times daily before a meal. , Starting Thu 06/26/2014, Historical Med    ibuprofen (ADVIL,MOTRIN) 100 MG tablet Take 100 mg by mouth every 6 (six) hours as needed for fever., Historical Med    Insulin Glargine (TOUJEO SOLOSTAR) 300 UNIT/ML SOPN Inject 50 Units into the skin daily., Starting Mon 12/07/2015, No Print    LYRICA 150 MG capsule Take 150 mg by mouth 2 (two) times daily. , Starting Thu 10/29/2015, Historical Med    metFORMIN (GLUCOPHAGE) 1000 MG tablet Take  500 mg by mouth 2 (two) times daily with a meal. , Starting Tue 05/19/2015, Historical Med    pantoprazole (PROTONIX) 40 MG tablet Take 40 mg by mouth daily. , Starting Wed 10/28/2015, Until Thu 10/27/2016, Historical Med    rosuvastatin (CRESTOR) 10 MG tablet Take 10 mg by mouth daily., Historical Med    senna-docusate (SENOKOT-S) 8.6-50 MG tablet Take 1 tablet by mouth daily as needed for mild constipation. , Starting Wed 10/28/2015, Until Thu 10/27/2016, Historical Med    sulfamethoxazole-trimethoprim (BACTRIM DS,SEPTRA DS) 800-160 MG tablet Take 1 tablet by mouth 2 (two) times daily., Starting Mon 12/07/2015, Print        Today   VITAL SIGNS:  Blood pressure 123/75, pulse 90, temperature 98.2 F (36.8 C), temperature source Oral, resp. rate 17, height '5\' 5"'$  (1.651 m), weight 89.8 kg (198 lb), SpO2 94 %.  I/O:  No intake or output data in the 24 hours ending 01/07/16 1228  PHYSICAL EXAMINATION:  Physical Exam  GENERAL:  60 y.o.-year-old patient lying in the bed with no acute distress.  LUNGS: Normal breath sounds bilaterally, no wheezing, rales,rhonchi or crepitation. No use of accessory muscles of respiration.  CARDIOVASCULAR: S1, S2 normal. No murmurs, rubs, or gallops.  ABDOMEN: Soft, non-tender, non-distended. Bowel sounds present. No organomegaly or mass.  NEUROLOGIC: Moves all 4 extremities. PSYCHIATRIC: The patient is alert and oriented x 3.   DATA REVIEW:   CBC  Recent Labs Lab 12/16/15 0403  WBC 22.2*  HGB 11.7*  HCT 35.5  PLT 202    Chemistries   Recent Labs Lab 12/16/15 0403  NA 134*  K 3.7  CL 98*  CO2 23  GLUCOSE 164*  BUN 52*  CREATININE 1.22*  CALCIUM 7.9*  AST 49*  ALT 56*  ALKPHOS 360*  BILITOT 0.2*    Cardiac Enzymes No results for input(s): TROPONINI in the last 168 hours.  Microbiology Results  Results for orders placed or performed during the hospital encounter of 12/02/15  Urine culture     Status: Abnormal   Collection Time:  12/02/15  9:26 PM  Result Value Ref Range Status   Specimen Description URINE, RANDOM  Final   Special Requests NONE  Final   Culture 80,000 COLONIES/mL ESCHERICHIA COLI (A)  Final   Report Status 12/05/2015 FINAL  Final   Organism ID, Bacteria ESCHERICHIA COLI (A)  Final      Susceptibility   Escherichia coli - MIC*    AMPICILLIN <=2 SENSITIVE Sensitive     CEFAZOLIN <=4 SENSITIVE Sensitive     CEFTRIAXONE <=1 SENSITIVE Sensitive     CIPROFLOXACIN <=0.25 SENSITIVE Sensitive     GENTAMICIN <=1 SENSITIVE Sensitive     IMIPENEM <=0.25 SENSITIVE Sensitive     NITROFURANTOIN 32 SENSITIVE Sensitive     TRIMETH/SULFA <=20 SENSITIVE Sensitive     AMPICILLIN/SULBACTAM <=2 SENSITIVE Sensitive     PIP/TAZO <=4 SENSITIVE Sensitive     Extended ESBL NEGATIVE Sensitive     * 80,000 COLONIES/mL ESCHERICHIA COLI  Blood Culture (routine x 2)     Status: None   Collection Time: 12/02/15 10:03 PM  Result Value Ref Range Status   Specimen Description BLOOD RIGHT FATTY CASTS  Final   Special Requests BOTTLES DRAWN AEROBIC AND ANAEROBIC Saline  Final   Culture NO GROWTH 5 DAYS  Final   Report Status 12/07/2015 FINAL  Final  Blood Culture (routine x 2)     Status: None   Collection Time: 12/02/15 10:04 PM  Result Value Ref Range Status   Specimen Description BLOOD LEFT FATTY CASTS  Final   Special Requests BOTTLES DRAWN AEROBIC AND ANAEROBIC 6CC  Final   Culture NO GROWTH 5 DAYS  Final   Report Status 12/07/2015 FINAL  Final  MRSA PCR Screening     Status: None   Collection Time: 12/03/15  9:16 AM  Result Value Ref Range Status   MRSA by PCR NEGATIVE NEGATIVE Final    Comment:        The GeneXpert MRSA Assay (FDA approved for NASAL specimens only), is one component of a comprehensive MRSA colonization surveillance program. It is not intended to diagnose MRSA infection nor to guide or monitor treatment for MRSA infections.     RADIOLOGY:  No results found.  Follow up with PCP in 1  week.  Management plans discussed with the patient, family and they are in agreement.  CODE STATUS:  Code Status History    Date Active Date Inactive Code Status Order ID Comments User Context   12/15/2015  9:39 PM 12/16/2015  8:37 PM DNR [id number]  Gladstone Lighter, MD Inpatient   12/11/2015 12:54 PM 12/11/2015  1:57 PM DNR [id number]  Hillary Bow, MD Inpatient   12/10/2015 12:38 PM 12/10/2015  3:56 PM DNR [id number]  Knox Royalty, NP Inpatient   12/09/2015 12:46 AM 12/10/2015 12:38 PM Full Code [id number]  Lance Coon, MD Inpatient   12/03/2015  9:16 AM 12/03/2015 12:45 PM Full Code [id number]  Lance Coon, MD Inpatient   11/01/2014  1:23 AM 11/02/2014  5:08 PM Full Code [id number]  Harrie Foreman, MD Inpatient   08/26/2014 12:53 PM 09/02/2014  6:53 PM Full Code [id number]  Nestor Lewandowsky, MD Inpatient    Questions for Most Recent Historical Code Status (Order [id number])    Question Answer Comment   In the event of cardiac or respiratory ARREST Do not call a "code blue"    In the event of cardiac or respiratory ARREST Do not perform Intubation, CPR, defibrillation or ACLS    In the event of cardiac or respiratory ARREST Use medication by any route, position, wound care, and other measures to relive pain and suffering. May use oxygen, suction and manual treatment of airway obstruction as needed for comfort.       TOTAL TIME TAKING CARE OF THIS PATIENT ON DAY OF DISCHARGE: more than 30 minutes.   Hillary Bow R M.D on 28-Dec-2015 at 12:28 PM  Between 7am to 6pm - Pager - [phone]  After 6pm go to [email or web address] - password [email or web address] [facility]  Office  [phone]  CC: Primary care physician; Cletis Athens, MD  Note: This dictation was prepared with Dragon dictation along with smaller phrase technology. Any transcriptional errors that result from this process are unintentional.

## 2016-01-10 DEATH — deceased

## 2016-01-14 ENCOUNTER — Ambulatory Visit: Payer: Self-pay | Admitting: Radiation Oncology

## 2016-01-21 ENCOUNTER — Encounter: Payer: Self-pay | Admitting: Internal Medicine

## 2016-03-19 ENCOUNTER — Other Ambulatory Visit: Payer: Self-pay | Admitting: Nurse Practitioner

## 2017-02-09 IMAGING — CR DG CHEST 1V PORT
1 series · 1 of 1 positions shown · non-contrast
Comparison: Portable exam 6019 hr compared to earlier exam of 8387
hours

CLINICAL DATA: Chest tube placement RIGHT side post VATS on
08/26/2014, shortness of breath, history hypertension, diabetes,
COPD

EXAM:
PORTABLE CHEST - 1 VIEW

[ap]
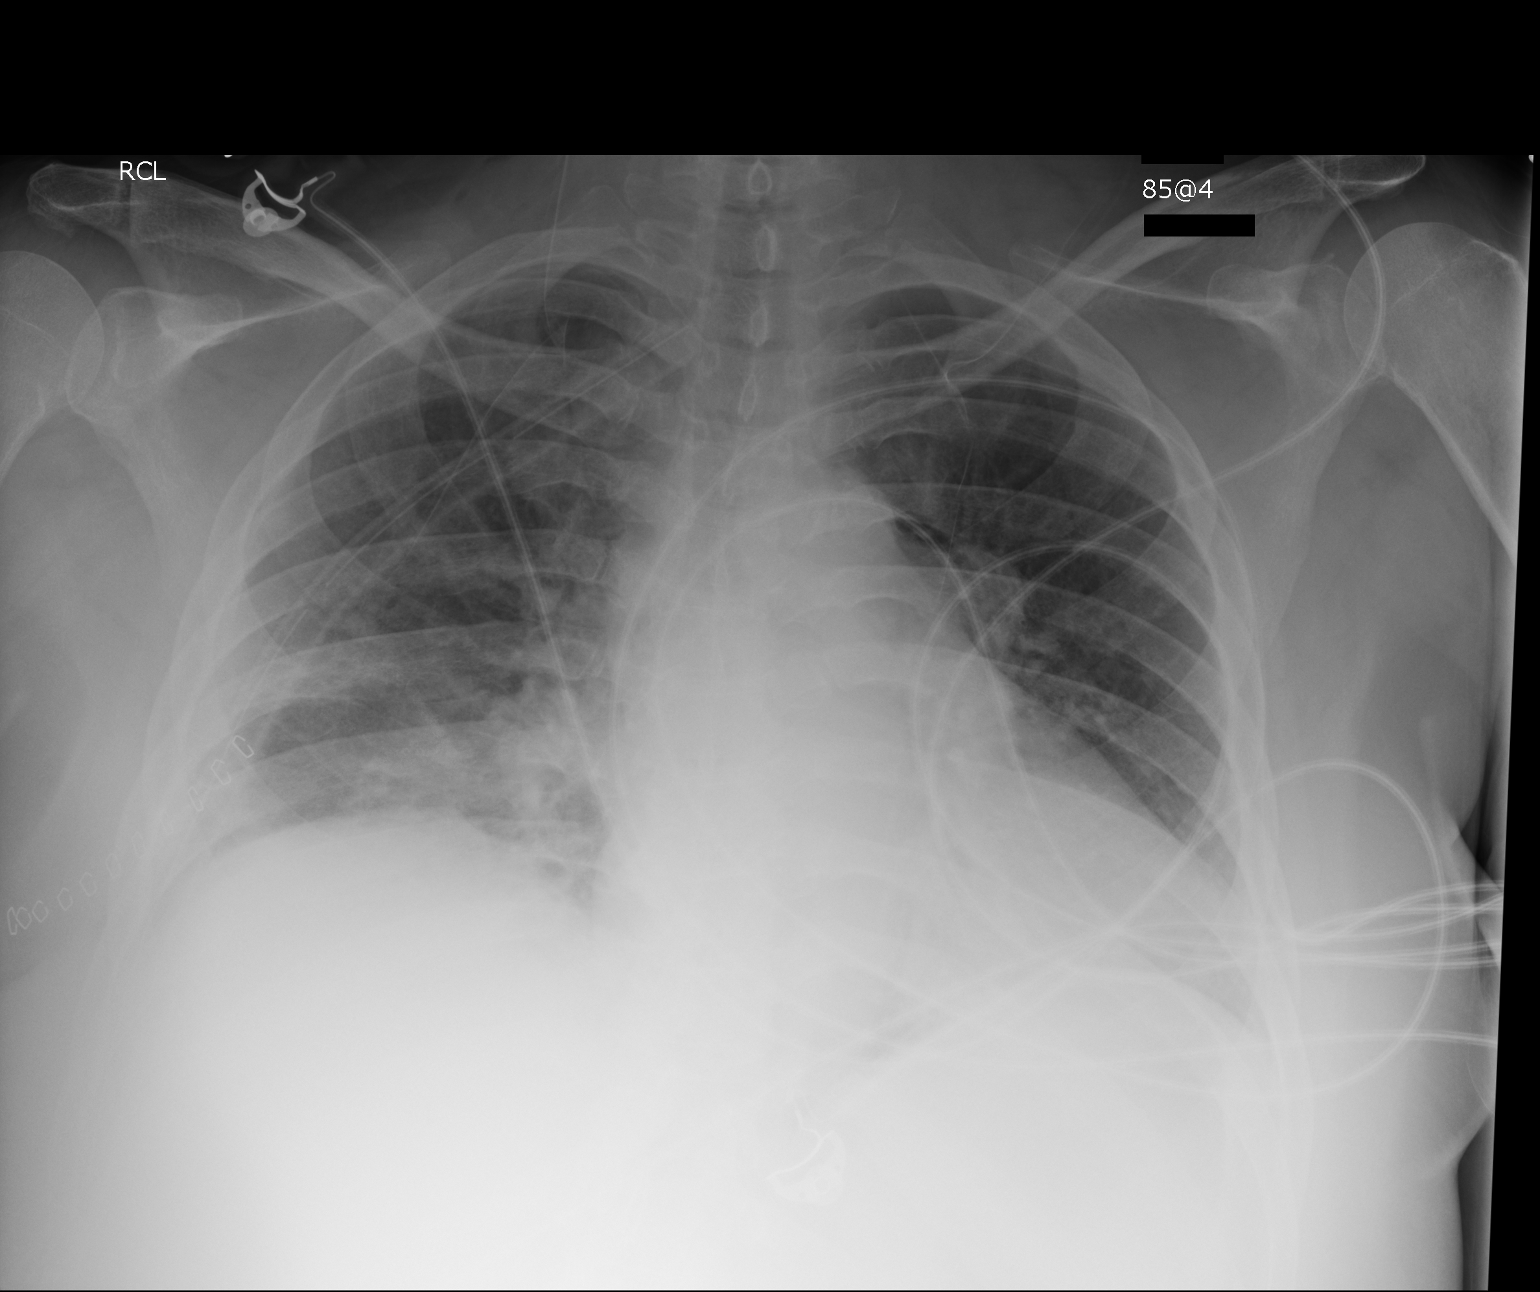

[1 of 1 positions shown; findings below may reference images not displayed]

FINDINGS: RIGHT thoracostomy tube stable tip near RIGHT apex.

Skin clips lower RIGHT chest laterally.

Scattered a bedding artifacts.

Enlargement of cardiac silhouette.

Mediastinal contours and pulmonary vascularity normal.

Decreased lung volumes with RIGHT basilar atelectasis.

No gross pleural effusion or pneumothorax.
IMPRESSION: RIGHT basilar atelectasis.

No pneumothorax in patient with RIGHT thoracostomy tube.

## 2017-02-09 IMAGING — CT CT ANGIO CHEST
1 of 2 series · 18 of 30 positions shown · IV contrast (APPLIED)
Comparison: 04/29/2014

CLINICAL DATA: Lethargic and hypoxic.

EXAM:
CT ANGIOGRAPHY CHEST WITH CONTRAST
TECHNIQUE: Multidetector CT imaging of the chest was performed using the
standard protocol during bolus administration of intravenous
contrast. Multiplanar CT image reconstructions and MIPs were
obtained to evaluate the vascular anatomy.
CONTRAST:  100mL OMNIPAQUE IOHEXOL 350 MG/ML SOLN

[Series 5: pe 1.0 thins · axial · 0.87mm/px · z∈[-324,-82]mm · 18 of 274 slices shown]
[im 16/274  lung]
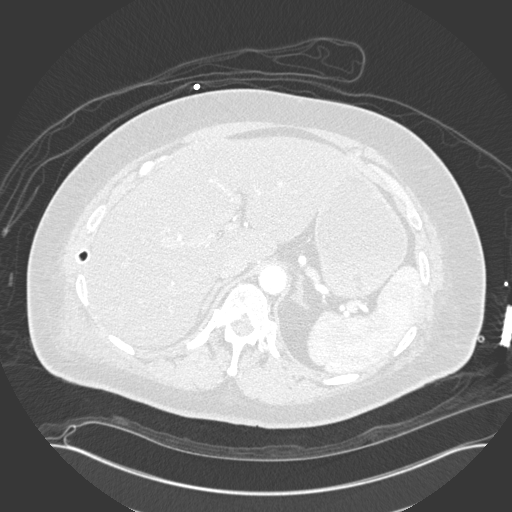
[im 31/274  mediastinal]
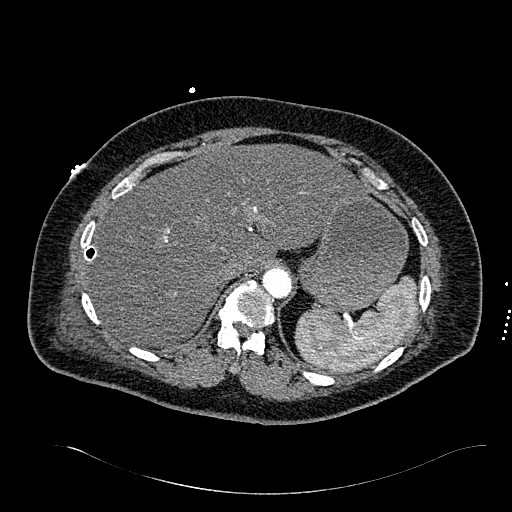
[im 46/274  lung]
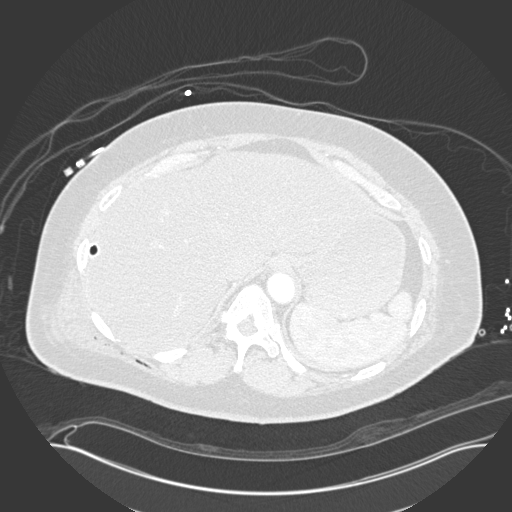
[im 61/274  mediastinal]
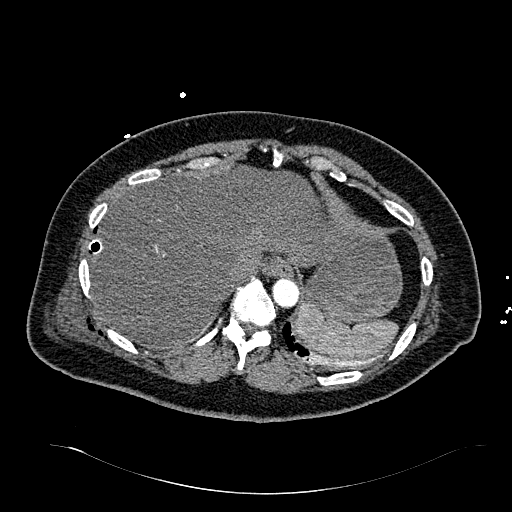
[im 76/274  lung]
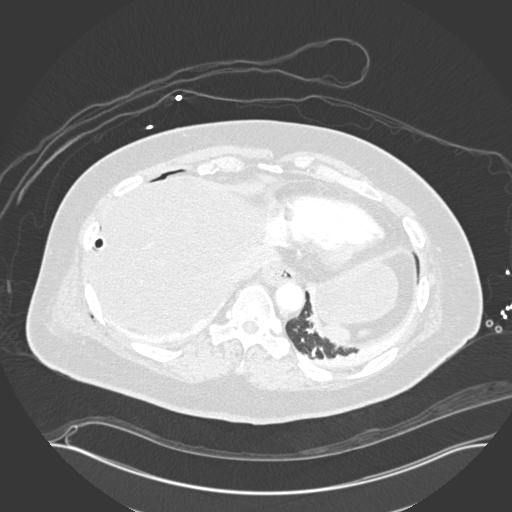
[im 92/274  mediastinal]
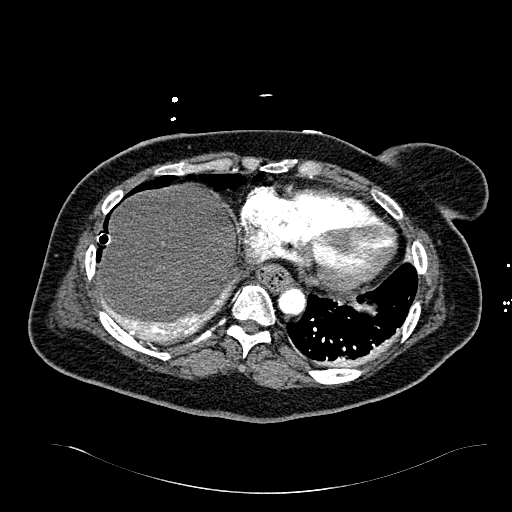
[im 107/274  lung]
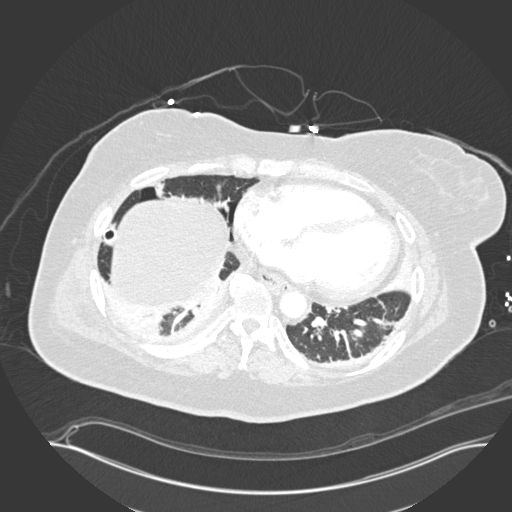
[im 122/274  mediastinal]
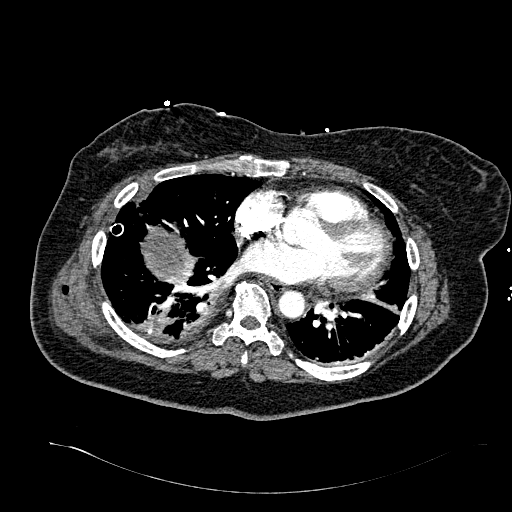
[im 128/274  lung]
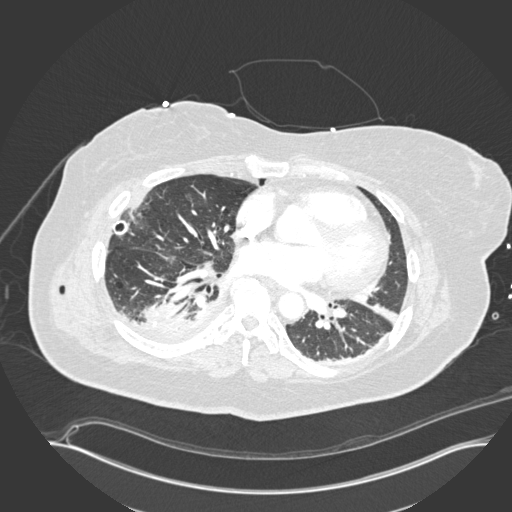
[im 137/274  mediastinal]
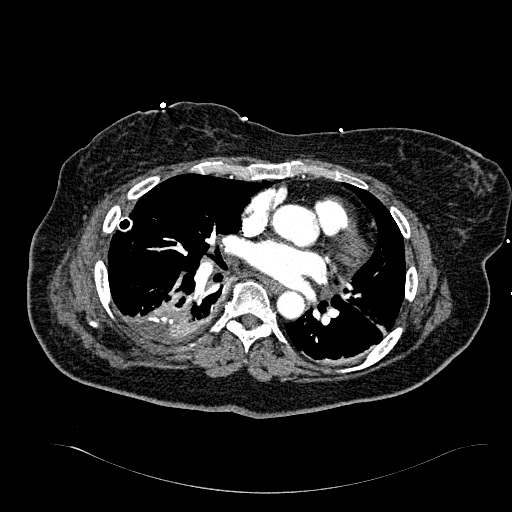
[im 152/274  lung]
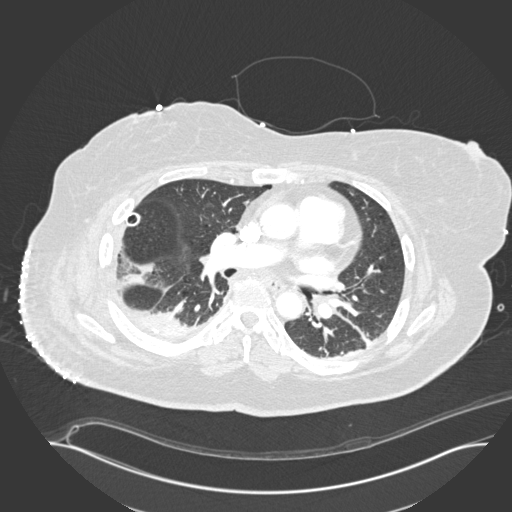
[im 167/274  mediastinal]
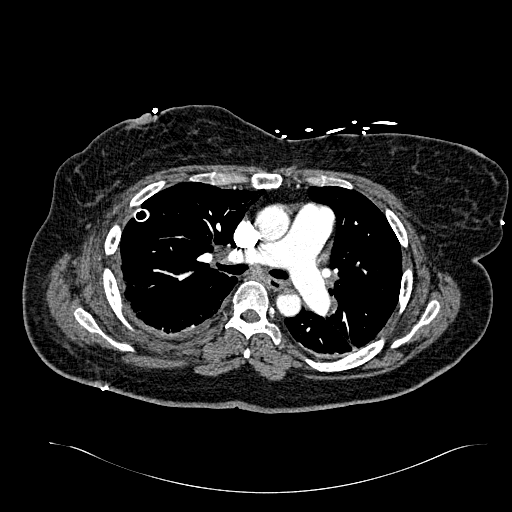
[im 183/274  lung]
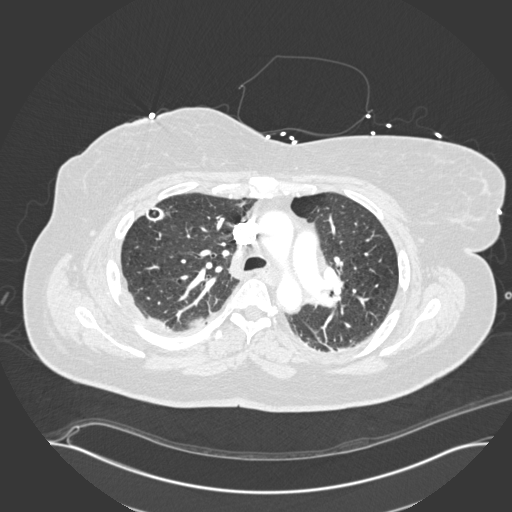
[im 198/274  mediastinal]
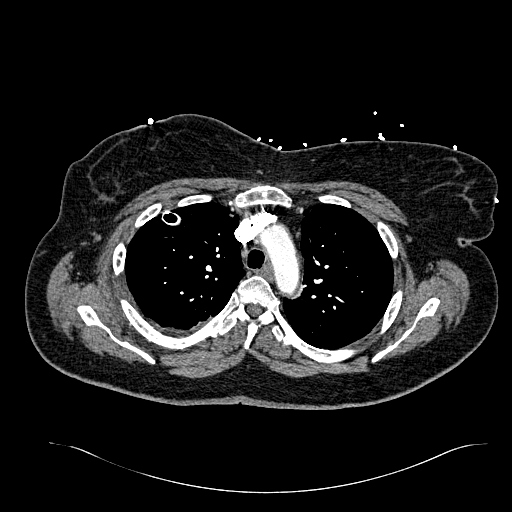
[im 213/274  lung]
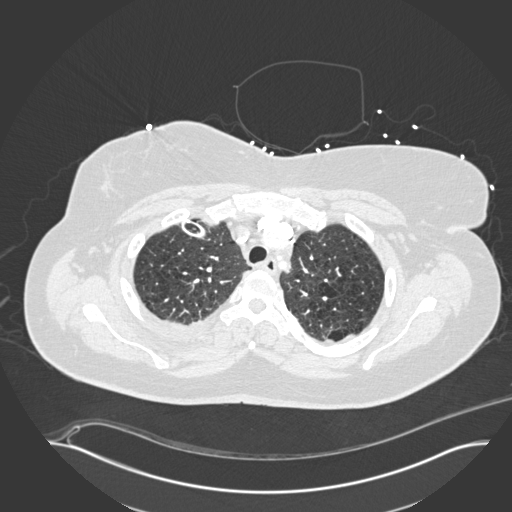
[im 228/274  mediastinal]
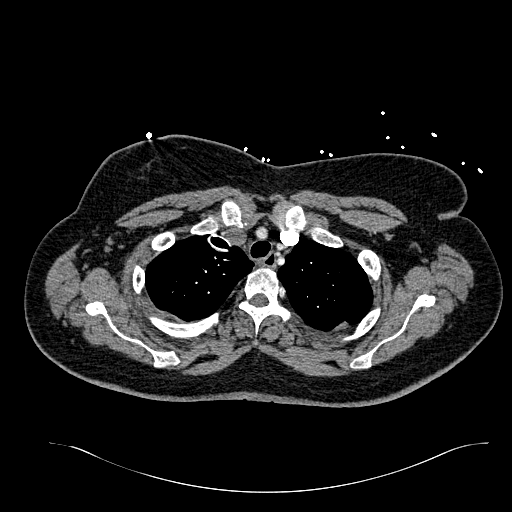
[im 243/274  lung]
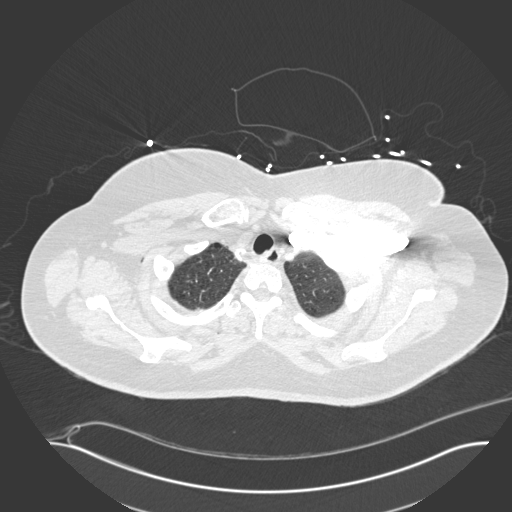
[im 258/274  mediastinal]
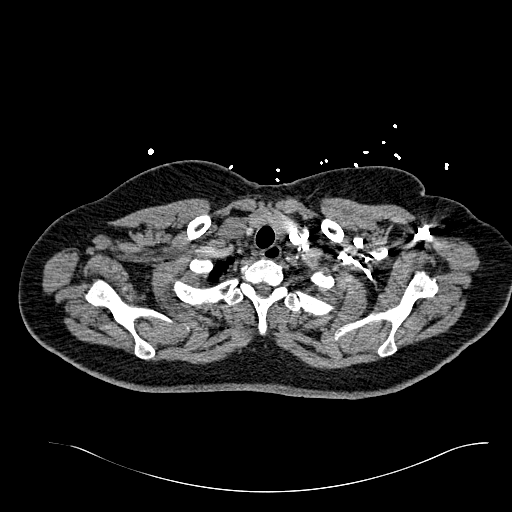

[18 of 30 positions shown; findings below may reference images not displayed]

FINDINGS: Negative for a pulmonary embolism. No evidence for aortic
dissection. Ascending thoracic aorta roughly measures 3.3 cm. Trace
amount of right pleural fluid. No significant pericardial effusion.
No significant chest lymphadenopathy.

Surgical changes compatible with a right thoracotomy with surgical
clips in the right lower lobe. Diffuse low density throughout the
liver is suggestive for hepatic steatosis.

There is a right chest tube which terminates at the medial right
lung apex. There is a trace amount of right pleural air at the right
lung base and along the medial right lung. Patient has underlying
emphysematous changes with blebs along the medial left upper lobe.
There is consolidation and volume loss in the right lower lobe which
is probably postsurgical. The spiculated lesion in the right lower
lobe has been removed. There is a 5 mm nodule in the right lower
lobe on sequence 6, image 60 which roughly measured 3 mm on the
prior examination. There is volume loss in the left lower lobe.

Small amount of subcutaneous gas in the chest. No acute bone
abnormality. There is mild distention of the distal esophagus with
debris.

Review of the MIP images confirms the above findings.
IMPRESSION: Negative for pulmonary embolism.

Postsurgical changes compatible with resection of the right lower
lobe lesion. There is a 5 mm nodular density in the right lower lobe
and recommend attention to this area on follow-up CT imaging.

Volume loss in the lower lobes bilaterally.

Right chest tube with a trace amount of right pleural air.

Emphysematous changes.

## 2017-08-24 IMAGING — CR DG CHEST 2V
1 series · 2 of 2 positions shown · non-contrast
Comparison: 11/13/2014

CLINICAL DATA: Chest pain with shortness of breath. History of lung
cancer.

EXAM:
CHEST  2 VIEW

[Series 1: dg chest 2 view · 0.14mm/px · 2 of 2 slices shown]
[im 1/2]
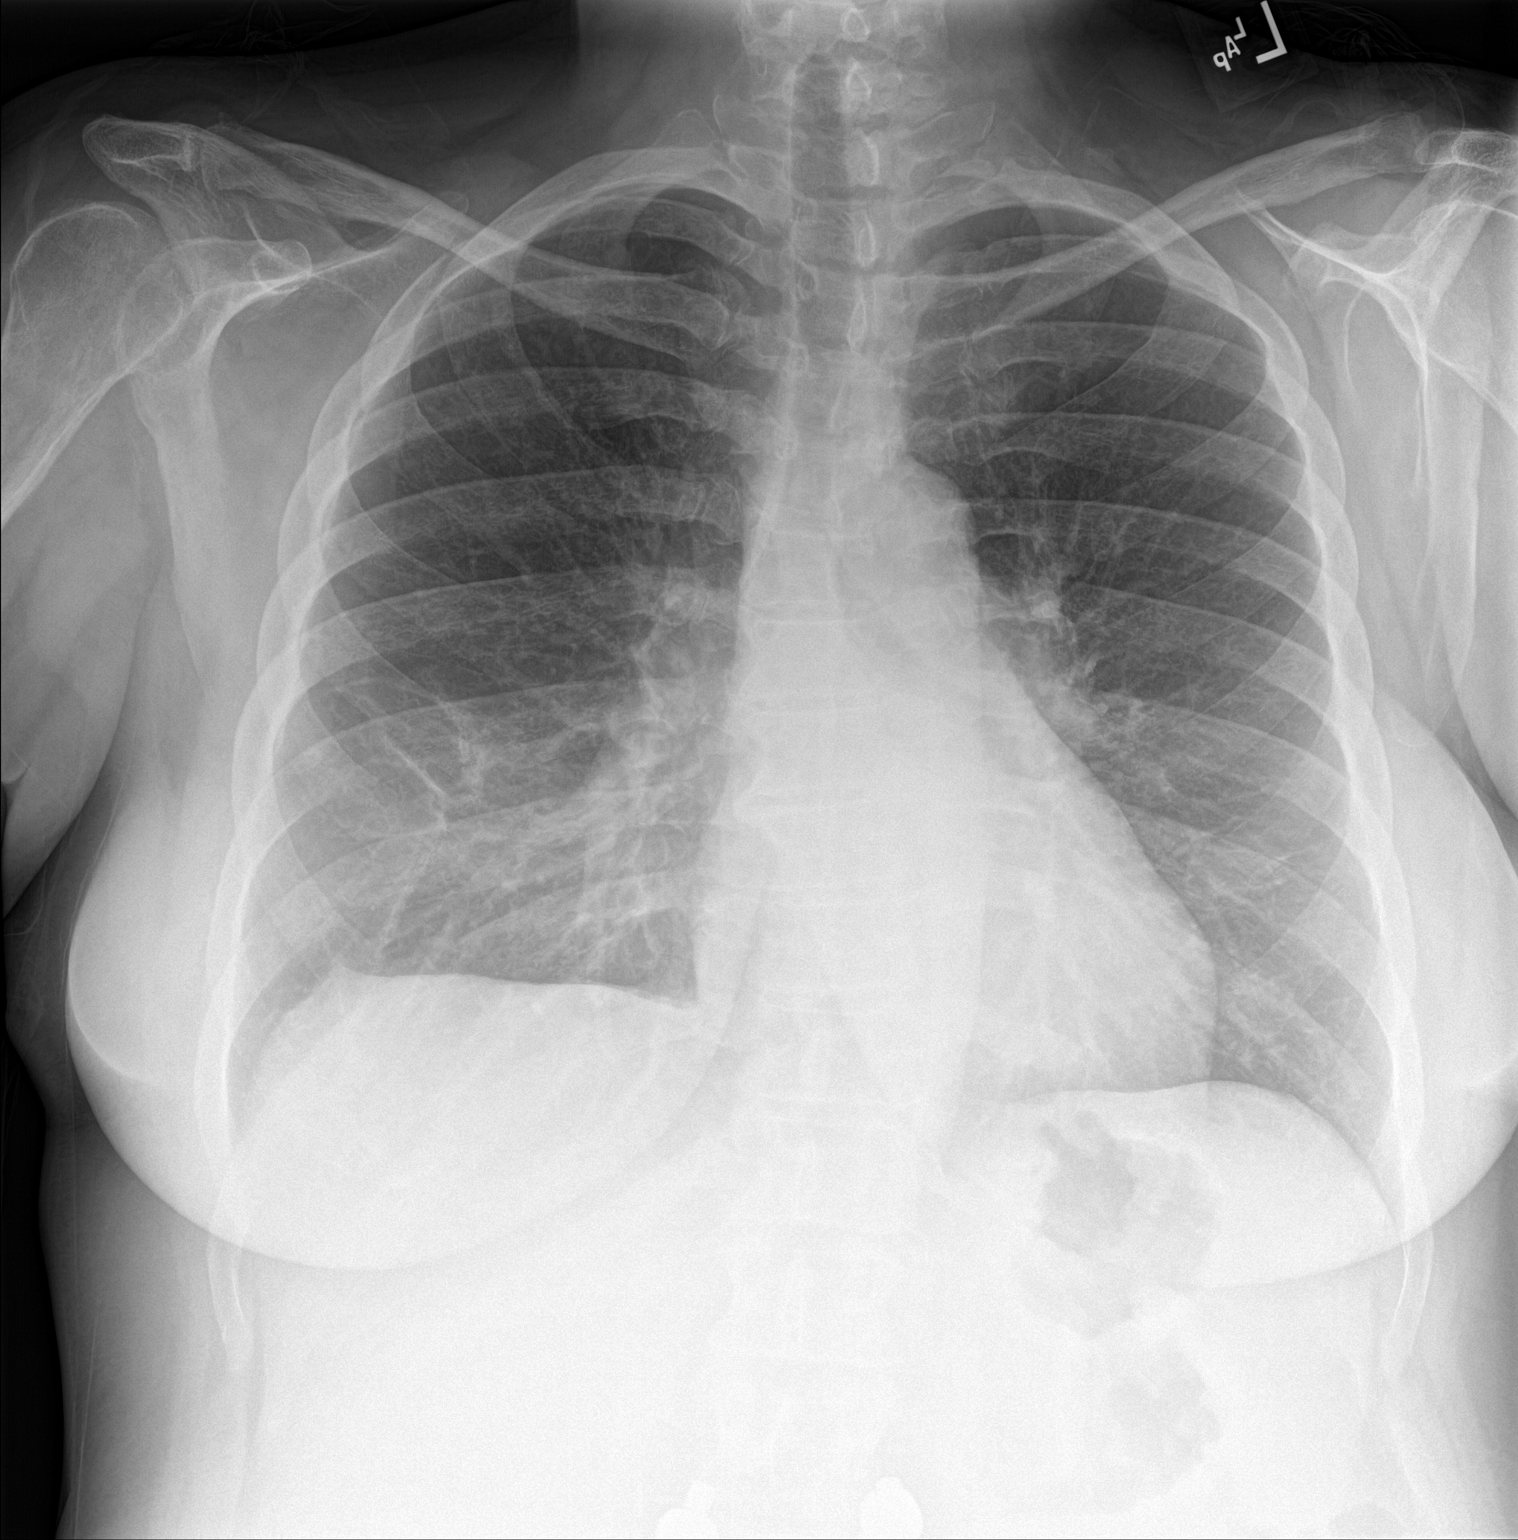
[im 2/2]
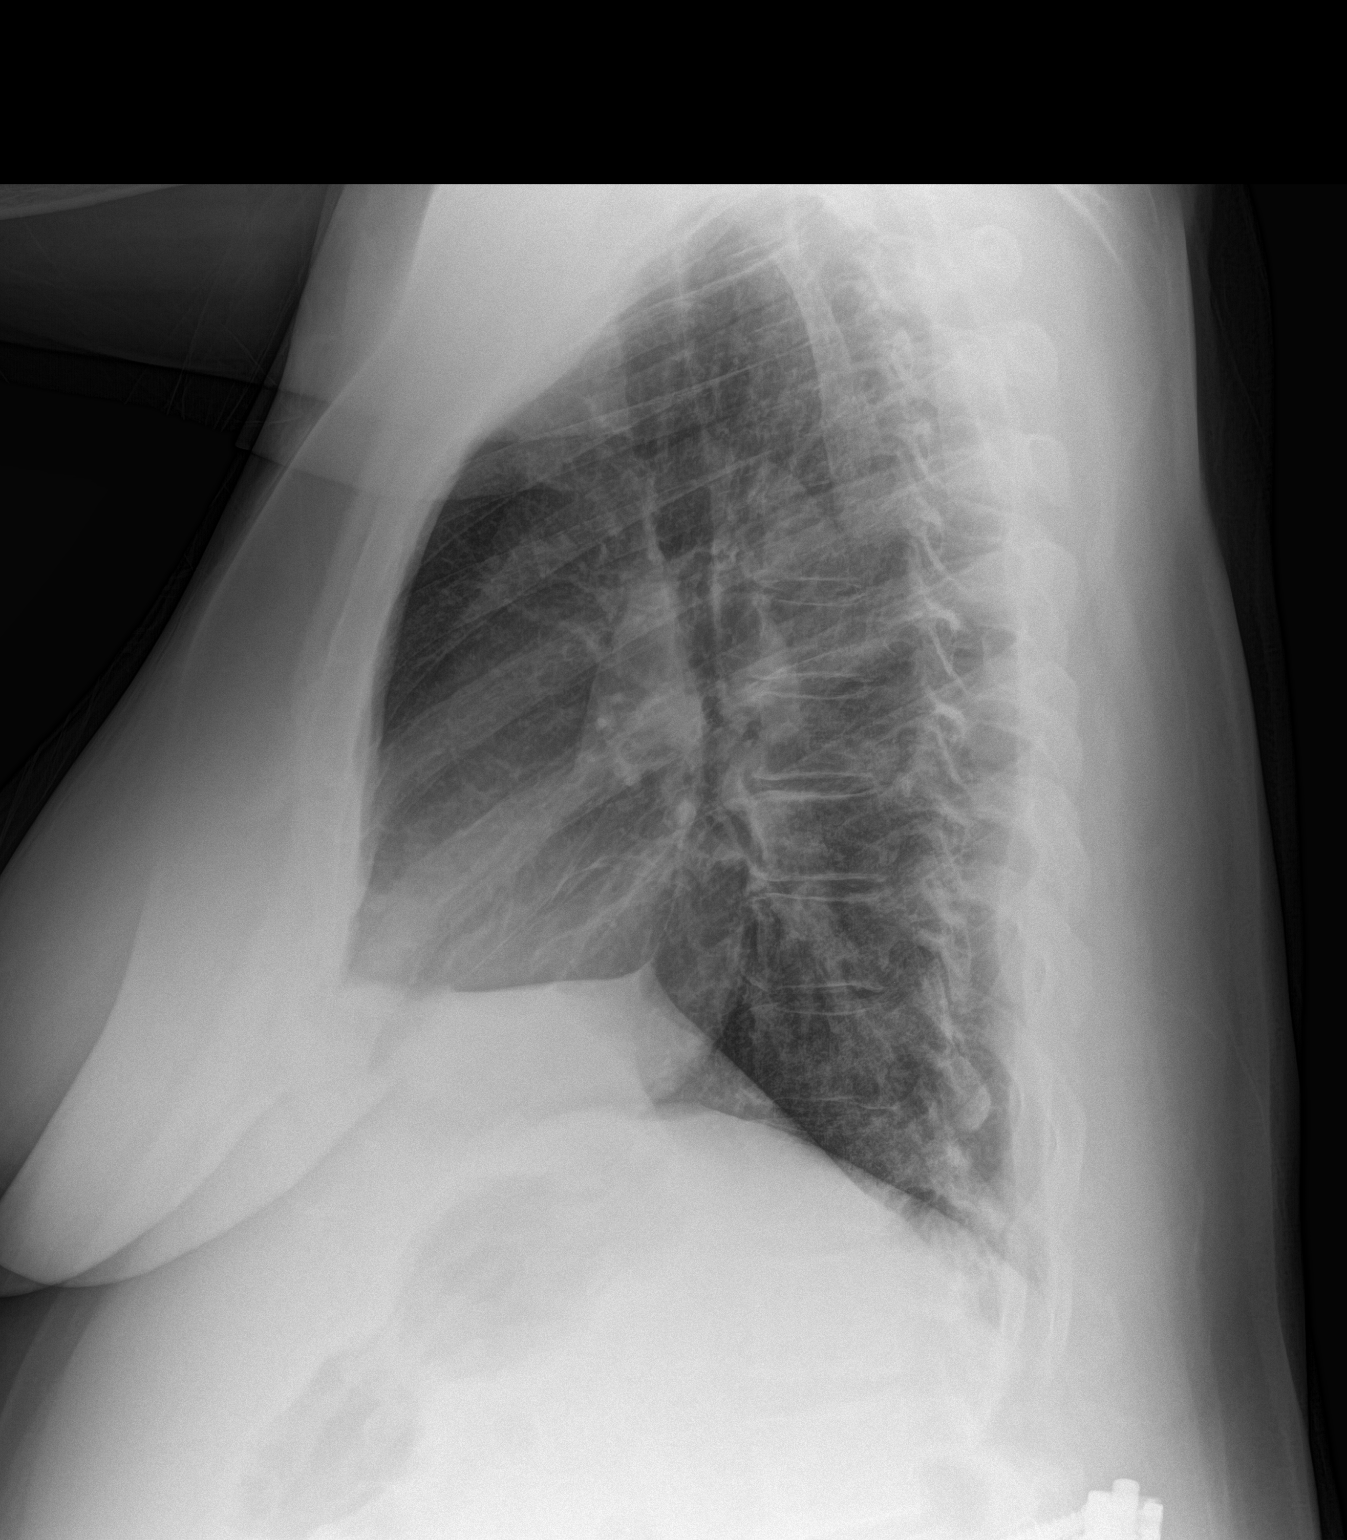

[2 of 2 positions shown; findings below may reference images not displayed]

FINDINGS: Chronic surgical changes in the right lower chest. Stable mild
elevation of the right hemidiaphragm. Negative for airspace disease
or pulmonary edema. Heart and mediastinum are within normal limits.
Trachea is midline. No acute bone abnormalities. No pleural
effusions.
IMPRESSION: No acute chest findings.
